# Patient Record
Sex: Male | Born: 1985
Health system: Southern US, Community
[De-identification: ages and names within clinical notes are randomized; demographics above are authoritative.]

## PROBLEM LIST (undated history)

## (undated) VITALS — BP 123/87 | HR 78 | Temp 97.7°F | Resp 16 | Ht 71.25 in | Wt 166.8 lb

## (undated) DIAGNOSIS — E119 Type 2 diabetes mellitus without complications: Secondary | ICD-10-CM

## (undated) DIAGNOSIS — F32A Depression, unspecified: Secondary | ICD-10-CM

## (undated) DIAGNOSIS — F329 Major depressive disorder, single episode, unspecified: Secondary | ICD-10-CM

---

## 2015-09-20 ENCOUNTER — Encounter (HOSPITAL_COMMUNITY): Payer: Self-pay | Admitting: *Deleted

## 2015-09-20 ENCOUNTER — Emergency Department (HOSPITAL_COMMUNITY)
Admission: EM | Admit: 2015-09-20 | Discharge: 2015-09-21 | Disposition: A | Discharge status: Other Acute Inpatient Hospital | Payer: Self-pay | Attending: Emergency Medicine | Admitting: Emergency Medicine

## 2015-09-20 DIAGNOSIS — Z720 Tobacco use: Secondary | ICD-10-CM | POA: Insufficient documentation

## 2015-09-20 DIAGNOSIS — E1065 Type 1 diabetes mellitus with hyperglycemia: Secondary | ICD-10-CM | POA: Insufficient documentation

## 2015-09-20 DIAGNOSIS — F329 Major depressive disorder, single episode, unspecified: Secondary | ICD-10-CM | POA: Insufficient documentation

## 2015-09-20 DIAGNOSIS — F1210 Cannabis abuse, uncomplicated: Secondary | ICD-10-CM | POA: Insufficient documentation

## 2015-09-20 DIAGNOSIS — Z794 Long term (current) use of insulin: Secondary | ICD-10-CM | POA: Insufficient documentation

## 2015-09-20 DIAGNOSIS — R45851 Suicidal ideations: Secondary | ICD-10-CM

## 2015-09-20 DIAGNOSIS — L42 Pityriasis rosea: Secondary | ICD-10-CM | POA: Insufficient documentation

## 2015-09-20 DIAGNOSIS — F1020 Alcohol dependence, uncomplicated: Secondary | ICD-10-CM

## 2015-09-20 HISTORY — DX: Major depressive disorder, single episode, unspecified: F32.9

## 2015-09-20 HISTORY — DX: Depression, unspecified: F32.A

## 2015-09-20 HISTORY — DX: Type 2 diabetes mellitus without complications: E11.9

## 2015-09-20 LAB — CBG MONITORING, ED
GLUCOSE-CAPILLARY: 370 mg/dL — AB (ref 65–99)
GLUCOSE-CAPILLARY: 406 mg/dL — AB (ref 65–99)
GLUCOSE-CAPILLARY: 319 mg/dL — AB (ref 65–99)
GLUCOSE-CAPILLARY: 273 mg/dL — AB (ref 65–99)
GLUCOSE-CAPILLARY: 381 mg/dL — AB (ref 65–99)
Glucose-Capillary: 214 mg/dL — ABNORMAL HIGH (ref 65–99)

## 2015-09-20 LAB — CBC
HCT: 48.1 % (ref 39.0–52.0)
Hemoglobin: 16.7 g/dL (ref 13.0–17.0)
MCH: 31.7 pg (ref 26.0–34.0)
MCHC: 34.7 g/dL (ref 30.0–36.0)
MCV: 91.3 fL (ref 78.0–100.0)
PLATELETS: 222 10*3/uL (ref 150–400)
RBC: 5.27 MIL/uL (ref 4.22–5.81)
RDW: 13.4 % (ref 11.5–15.5)
WBC: 9 10*3/uL (ref 4.0–10.5)

## 2015-09-20 LAB — ETHANOL

## 2015-09-20 LAB — COMPREHENSIVE METABOLIC PANEL
ALT: 22 U/L (ref 17–63)
ANION GAP: 9 (ref 5–15)
AST: 23 U/L (ref 15–41)
Albumin: 4.6 g/dL (ref 3.5–5.0)
Alkaline Phosphatase: 68 U/L (ref 38–126)
BILIRUBIN TOTAL: 1.5 mg/dL — AB (ref 0.3–1.2)
BUN: 15 mg/dL (ref 6–20)
CO2: 24 mmol/L (ref 22–32)
Calcium: 8.9 mg/dL (ref 8.9–10.3)
Chloride: 102 mmol/L (ref 101–111)
Creatinine, Ser: 1.04 mg/dL (ref 0.61–1.24)
Glucose, Bld: 401 mg/dL — ABNORMAL HIGH (ref 65–99)
POTASSIUM: 4.7 mmol/L (ref 3.5–5.1)
Sodium: 135 mmol/L (ref 135–145)
TOTAL PROTEIN: 7.6 g/dL (ref 6.5–8.1)

## 2015-09-20 LAB — ACETAMINOPHEN LEVEL: Acetaminophen (Tylenol), Serum: <10 ug/mL — ABNORMAL LOW (ref 10–30)

## 2015-09-20 LAB — RAPID URINE DRUG SCREEN, HOSP PERFORMED
Amphetamines: NOT DETECTED
BENZODIAZEPINES: NOT DETECTED
Barbiturates: NOT DETECTED
Cocaine: NOT DETECTED
Opiates: NOT DETECTED
Tetrahydrocannabinol: POSITIVE — AB

## 2015-09-20 LAB — SALICYLATE LEVEL

## 2015-09-20 MED ORDER — INSULIN ASPART 100 UNIT/ML ~~LOC~~ SOLN
10.0000 [IU] | Freq: Once | SUBCUTANEOUS | Status: AC
  Administered 2015-09-20: 10 [IU] via SUBCUTANEOUS
  Filled 2015-09-20: qty 1

## 2015-09-20 MED ORDER — SODIUM CHLORIDE 0.9 % IV BOLUS (SEPSIS)
1000.0000 mL | Freq: Once | INTRAVENOUS | Status: AC
  Administered 2015-09-20: 1000 mL via INTRAVENOUS

## 2015-09-20 MED ORDER — SODIUM CHLORIDE 0.9 % IV SOLN
Freq: Once | INTRAVENOUS | Status: AC
  Administered 2015-09-20: 19:00:00 via INTRAVENOUS

## 2015-09-20 MED ORDER — INSULIN ASPART 100 UNIT/ML ~~LOC~~ SOLN
12.0000 [IU] | Freq: Once | SUBCUTANEOUS | Status: AC
  Administered 2015-09-20: 12 [IU] via SUBCUTANEOUS

## 2015-09-20 MED ORDER — TRIAMCINOLONE ACETONIDE 0.5 % EX CREA
TOPICAL_CREAM | Freq: Two times a day (BID) | CUTANEOUS | Status: DC
  Administered 2015-09-20: 18:00:00 via TOPICAL
  Administered 2015-09-21: 1 via TOPICAL
  Filled 2015-09-20: qty 15

## 2015-09-20 MED ORDER — INSULIN NPH (HUMAN) (ISOPHANE) 100 UNIT/ML ~~LOC~~ SUSP
20.0000 [IU] | Freq: Two times a day (BID) | SUBCUTANEOUS | Status: DC
  Filled 2015-09-20: qty 10

## 2015-09-20 MED ORDER — INSULIN NPH (HUMAN) (ISOPHANE) 100 UNIT/ML ~~LOC~~ SUSP
20.0000 [IU] | Freq: Once | SUBCUTANEOUS | Status: AC
  Administered 2015-09-20: 20 [IU] via SUBCUTANEOUS

## 2015-09-20 MED ORDER — INSULIN NPH (HUMAN) (ISOPHANE) 100 UNIT/ML ~~LOC~~ SUSP
20.0000 [IU] | Freq: Two times a day (BID) | SUBCUTANEOUS | Status: DC
  Administered 2015-09-20: 20 [IU] via SUBCUTANEOUS
  Filled 2015-09-20 (×2): qty 10

## 2015-09-20 MED ORDER — ONDANSETRON 4 MG PO TBDP
4.0000 mg | ORAL_TABLET | Freq: Once | ORAL | Status: AC
  Administered 2015-09-20: 4 mg via ORAL
  Filled 2015-09-20: qty 1
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
## 2015-09-20 NOTE — ED Notes (Signed)
Pt states "I was really drunk and apparently texted people that I was going to kill myself.  I was going to take as much xanax as I could.  I get it from a street pharmacist.  I was married, we're just separated.  The sheriff came out but then I guess someone else called and the sheriff came back out and brought me in."  Pt denies SI presently.  States "I have no doctor for my diabetes, I buy the insulin OTC".

## 2015-09-20 NOTE — ED Provider Notes (Signed)
CSN: [id number]     Arrival date & time 09/20/15  1228 History  By signing my name below, I, Micheal Lopez, attest that this documentation has been prepared under the direction and in the presence of [employer], PA-C. Electronically Signed: Ronney Lopez, ED Scribe. 09/20/2015. 1:52 PM.    Chief Complaint  Patient presents with  . Suicidal   The history is provided by the patient. No language interpreter was used.    HPI Comments: Micheal Lopez is a 29 y.o. male with a history of Type 1 DM and depression, who presents to the Emergency Department complaining of intermittent SI that began a couple days ago. Patient states he called the suicide crisis hotline and they had sent the police over to bring him here to the ED. Last night, he had also gotten drunk from beer and liquor and texted people that he was going to kill himself. He states he did "not really" have a plan, although he also states he thought he would overdose on pills so that he would never wake up. He denies HI or hallucinations. Patient smokes marijuana but denies any other illicit drug use.   Patient notes chronic intermittent pain in his left lower back "whenever [he drinks] anything besides beer, water, or juice," as well as a skin rash on his front right abdomen and back that has been present all summer. It was alleviated after reducing his sun exposure.   Patient also complains of nausea. He denies any fever, chills, chest pain, abdominal pain, vomiting, diarrhea.   Past Medical History  Diagnosis Date  . Diabetes mellitus without complication   . Depression     per pt.   History reviewed. No pertinent past surgical history. No family history on file. Social History  Substance Use Topics  . Smoking status: Current Every Day Smoker  . Smokeless tobacco: None  . Alcohol Use: 50.4 oz/week    84 Cans of beer per week     Comment: "or more daily"    Review of Systems  Gastrointestinal: Positive for nausea.  Skin:  Positive for rash.  Psychiatric/Behavioral: Positive for suicidal ideas.  All other systems reviewed and are negative.   Allergies  Review of patient's allergies indicates no known allergies.  Home Medications   Prior to Admission medications   Medication Sig Start Date End Date Taking? Authorizing Provider  insulin NPH Human (NOVOLIN N) 100 UNIT/ML injection Inject 20 Units into the skin 2 (two) times daily.   Yes Historical Provider, MD  insulin regular (NOVOLIN R,HUMULIN R) 100 units/mL injection Inject 1-20 Units into the skin See admin instructions. Check cbgs 8 times a day. CBG >180= 5 units, 200-249= 10 units, 250-299= 12 units, 300s= 15 units, 400s= 20 units   Yes Historical Provider, MD   BP 140/78 mmHg  Pulse 106  Temp(Src) 98.2 F (36.8 C) (Oral)  Resp 20  Ht  (1.803 m)  Wt 180 lb (81.647 kg)  BMI 25.12 kg/m2  SpO2 97% Physical Exam  Constitutional: He is oriented to person, place, and time. He appears well-developed and well-nourished. No distress.  HENT:  Head: Normocephalic and atraumatic.  Eyes: Conjunctivae and EOM are normal.  Neck: Neck supple. No tracheal deviation present.  Cardiovascular: Normal rate.   Pulmonary/Chest: Effort normal. No respiratory distress.  Musculoskeletal: Normal range of motion.  Neurological: He is alert and oriented to person, place, and time.  Skin: Skin is warm and dry. Rash noted. There is erythema.  Macular,  non-raised, erythematous circular rash over the back and right side of the chest. It has no central clearing. No active drainage. Does not appear cellulitic in nature.   Psychiatric: He has a normal mood and affect. His speech is normal and behavior is normal. His affect is not inappropriate. He expresses suicidal ideation. He expresses no homicidal ideation. He expresses suicidal plans. He expresses no homicidal plans.  SI with a plan to take pills and not wake up. Not homicidal and not hallucinating. He is cooperative  and answers questions appropriately.   Nursing note and vitals reviewed.   ED Course  Procedures (including critical care time)  DIAGNOSTIC STUDIES: Oxygen Saturation is 97% on RA, normal by my interpretation.    COORDINATION OF CARE: 1:39 PM - Discussed treatment plan with pt at bedside which includes treatment for rash. Pt verbalized understanding and agreed to plan.    Labs Review Labs Reviewed  COMPREHENSIVE METABOLIC PANEL - Abnormal; Notable for the following:    Glucose, Bld 401 (*)    Total Bilirubin 1.5 (*)    All other components within normal limits  ACETAMINOPHEN LEVEL - Abnormal; Notable for the following:    Acetaminophen (Tylenol), Serum <10 (*)    All other components within normal limits  URINE RAPID DRUG SCREEN, HOSP PERFORMED - Abnormal; Notable for the following:    Tetrahydrocannabinol POSITIVE (*)    All other components within normal limits  CBG MONITORING, ED - Abnormal; Notable for the following:    Glucose-Capillary 370 (*)    All other components within normal limits  CBG MONITORING, ED - Abnormal; Notable for the following:    Glucose-Capillary 406 (*)    All other components within normal limits  ETHANOL  SALICYLATE LEVEL  CBC    Imaging Review No results found. I have personally reviewed and evaluated these lab results as part of my medical decision-making.   EKG Interpretation None      MDM   Final diagnoses:  Suicidal ideation  Pityriasis rosea  Type 1 diabetes mellitus with hyperglycemia  Patient presents for SI and rash. His vitals are stable and he is well-appearing. His glucose is 370 but otherwise his labs are not concerning. He states he did not take his insulin this morning. He also states that he takes 20 units of NovoLog in the morning and at night. He takes insulin throughout the day but states that it depends on his sugar level and does not use a sliding scale. He is not in DKA and has a normal anion gap. He was given 10  units of NovoLog and fluids. His rash appears to be pityriasis rosea and is most likely viral. This rash usually resolves on its own but he was given triamcinolone 0.5% cream. Filed Vitals:   09/20/15 1627  BP: 140/78  Pulse: 106  Temp:   Resp: 20  Holding orders were placed and home meds were ordered. Patient is stable for transfer to TCU. I personally performed the services described in this documentation, which was scribed in my presence. The recorded information has been reviewed and is accurate.     Catha Gosselin, PA-C 09/20/15 1641  Pricilla Loveless, MD 09/22/15 [phone]

## 2015-09-20 NOTE — Progress Notes (Signed)
CM spoke with pt who confirms uninsured [place] resident with no pcp.  CM discussed and provided written information for uninsured accepting pcps, discussed the importance of pcp vs EDP services for f/u care, www.needymeds.org, www.goodrx.com, discounted pharmacies and other [place] resources such as [facility] , [facility], affordable care act, financial assistance, uninsured dental services, [facility] med assist, DSS and  health department  Reviewed resources for [place] uninsured accepting pcps like [facility], [facility] [address], [facility], [facility], local urgent care centers, [facility], [facility], general medical clinics, [facility], [facility] plus others, medication resources, [facility] out patient pharmacies and housing Pt voiced understanding and appreciation of resources provided   Provided [facility] contact information Pt agreed to a referral Cm completed referral Pt to be contact by [facility] clinical liason  

## 2015-09-20 NOTE — ED Notes (Signed)
Sitter @ BS, Triage 3.
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
## 2015-09-21 ENCOUNTER — Encounter (HOSPITAL_COMMUNITY): Payer: Self-pay | Admitting: *Deleted

## 2015-09-21 ENCOUNTER — Inpatient Hospital Stay (HOSPITAL_COMMUNITY)
Admission: AD | Admit: 2015-09-21 | Discharge: 2015-09-23 | DRG: 897 | Disposition: A | Discharge status: Home / Self Care | Payer: Federal, State, Local not specified - Other | Source: Intra-hospital | Attending: Psychiatry | Admitting: Psychiatry

## 2015-09-21 DIAGNOSIS — F1024 Alcohol dependence with alcohol-induced mood disorder: Principal | ICD-10-CM | POA: Diagnosis present

## 2015-09-21 DIAGNOSIS — F1020 Alcohol dependence, uncomplicated: Secondary | ICD-10-CM

## 2015-09-21 DIAGNOSIS — E119 Type 2 diabetes mellitus without complications: Secondary | ICD-10-CM | POA: Diagnosis present

## 2015-09-21 DIAGNOSIS — F1994 Other psychoactive substance use, unspecified with psychoactive substance-induced mood disorder: Secondary | ICD-10-CM | POA: Diagnosis present

## 2015-09-21 DIAGNOSIS — R45851 Suicidal ideations: Secondary | ICD-10-CM | POA: Insufficient documentation

## 2015-09-21 DIAGNOSIS — F17210 Nicotine dependence, cigarettes, uncomplicated: Secondary | ICD-10-CM | POA: Diagnosis present

## 2015-09-21 LAB — CBG MONITORING, ED
GLUCOSE-CAPILLARY: 125 mg/dL — AB (ref 65–99)
GLUCOSE-CAPILLARY: 146 mg/dL — AB (ref 65–99)
GLUCOSE-CAPILLARY: 215 mg/dL — AB (ref 65–99)
GLUCOSE-CAPILLARY: 332 mg/dL — AB (ref 65–99)
GLUCOSE-CAPILLARY: 55 mg/dL — AB (ref 65–99)

## 2015-09-21 LAB — GLUCOSE, CAPILLARY
GLUCOSE-CAPILLARY: 215 mg/dL — AB (ref 65–99)
Glucose-Capillary: 350 mg/dL — ABNORMAL HIGH (ref 65–99)

## 2015-09-21 MED ORDER — INSULIN ASPART 100 UNIT/ML ~~LOC~~ SOLN: 4.0000 [IU] | Freq: Three times a day (TID) | SUBCUTANEOUS | Status: DC

## 2015-09-21 MED ORDER — INSULIN ASPART 100 UNIT/ML ~~LOC~~ SOLN: 0.0000 [IU] | Freq: Every day | SUBCUTANEOUS | Status: DC

## 2015-09-21 MED ORDER — INSULIN ASPART 100 UNIT/ML ~~LOC~~ SOLN
0.0000 [IU] | Freq: Three times a day (TID) | SUBCUTANEOUS | Status: DC
  Administered 2015-09-21: 3 [IU] via SUBCUTANEOUS
  Administered 2015-09-22: 5 [IU] via SUBCUTANEOUS
  Administered 2015-09-22: 2 [IU] via SUBCUTANEOUS
  Administered 2015-09-22: 9 [IU] via SUBCUTANEOUS

## 2015-09-21 MED ORDER — INSULIN ASPART 100 UNIT/ML ~~LOC~~ SOLN: 0.0000 [IU] | Freq: Three times a day (TID) | SUBCUTANEOUS | Status: DC

## 2015-09-21 MED ORDER — PNEUMOCOCCAL VAC POLYVALENT 25 MCG/0.5ML IJ INJ
0.5000 mL | INJECTION | INTRAMUSCULAR | Status: AC
  Administered 2015-09-22: 0.5 mL via INTRAMUSCULAR

## 2015-09-21 MED ORDER — TRAZODONE HCL 100 MG PO TABS: 100.0000 mg | ORAL_TABLET | Freq: Every day | ORAL | Status: DC

## 2015-09-21 MED ORDER — FLUOXETINE HCL 10 MG PO CAPS
10.0000 mg | ORAL_CAPSULE | Freq: Every day | ORAL | Status: DC
Start: 2015-09-21 — End: 2015-09-21
  Administered 2015-09-21: 10 mg via ORAL
  Filled 2015-09-21: qty 1

## 2015-09-21 MED ORDER — QUETIAPINE FUMARATE ER 50 MG PO TB24
50.0000 mg | ORAL_TABLET | Freq: Once | ORAL | Status: DC
  Filled 2015-09-21: qty 1

## 2015-09-21 MED ORDER — INSULIN ASPART 100 UNIT/ML ~~LOC~~ SOLN
4.0000 [IU] | Freq: Three times a day (TID) | SUBCUTANEOUS | Status: DC
  Administered 2015-09-21 – 2015-09-23 (×5): 4 [IU] via SUBCUTANEOUS

## 2015-09-21 MED ORDER — LORAZEPAM 1 MG PO TABS: 2.0000 mg | ORAL_TABLET | Freq: Four times a day (QID) | ORAL | Status: DC | PRN

## 2015-09-21 MED ORDER — INSULIN ASPART 100 UNIT/ML ~~LOC~~ SOLN
0.0000 [IU] | Freq: Three times a day (TID) | SUBCUTANEOUS | Status: DC
  Administered 2015-09-21: 7 [IU] via SUBCUTANEOUS

## 2015-09-21 MED ORDER — ALUM & MAG HYDROXIDE-SIMETH 200-200-20 MG/5ML PO SUSP
30.0000 mL | ORAL | Status: DC | PRN
  Administered 2015-09-21: 30 mL via ORAL
  Filled 2015-09-21: qty 30

## 2015-09-21 MED ORDER — INSULIN NPH (HUMAN) (ISOPHANE) 100 UNIT/ML ~~LOC~~ SUSP
20.0000 [IU] | Freq: Two times a day (BID) | SUBCUTANEOUS | Status: DC
  Administered 2015-09-21 – 2015-09-23 (×4): 20 [IU] via SUBCUTANEOUS

## 2015-09-21 MED ORDER — FLUOXETINE HCL 10 MG PO CAPS
10.0000 mg | ORAL_CAPSULE | Freq: Every day | ORAL | Status: DC
  Administered 2015-09-22 – 2015-09-23 (×2): 10 mg via ORAL
  Filled 2015-09-21 (×3): qty 1
  Filled 2015-09-21: qty 7

## 2015-09-21 MED ORDER — INSULIN NPH (HUMAN) (ISOPHANE) 100 UNIT/ML ~~LOC~~ SUSP
10.0000 [IU] | Freq: Once | SUBCUTANEOUS | Status: AC
  Administered 2015-09-21: 10 [IU] via SUBCUTANEOUS

## 2015-09-21 MED ORDER — INSULIN ASPART 100 UNIT/ML ~~LOC~~ SOLN
4.0000 [IU] | Freq: Three times a day (TID) | SUBCUTANEOUS | Status: DC
  Administered 2015-09-21: 4 [IU] via SUBCUTANEOUS

## 2015-09-21 MED ORDER — TRAZODONE HCL 100 MG PO TABS
100.0000 mg | ORAL_TABLET | Freq: Every day | ORAL | Status: DC
  Administered 2015-09-21 – 2015-09-22 (×2): 100 mg via ORAL
  Filled 2015-09-21: qty 1
  Filled 2015-09-21: qty 7
  Filled 2015-09-21 (×3): qty 1

## 2015-09-21 MED ORDER — TRIAMCINOLONE ACETONIDE 0.5 % EX CREA
TOPICAL_CREAM | Freq: Two times a day (BID) | CUTANEOUS | Status: DC
  Administered 2015-09-21 – 2015-09-23 (×4): via TOPICAL
  Filled 2015-09-21 (×2): qty 15

## 2015-09-21 MED ORDER — INSULIN ASPART 100 UNIT/ML ~~LOC~~ SOLN
0.0000 [IU] | Freq: Every day | SUBCUTANEOUS | Status: DC
  Administered 2015-09-21 – 2015-09-22 (×2): 4 [IU] via SUBCUTANEOUS

## 2015-09-21 NOTE — ED Notes (Signed)
DELAY IN INSULIN ADMINISTRATION. MADE AWARE OF AM CBG. FAITH MARIE CHARGE RN TO MADE EDP MADE. WILL THERE BE DOSAGE CHANGE.

## 2015-09-21 NOTE — Progress Notes (Signed)
Pt attended NA group this evening.  

## 2015-09-21 NOTE — ED Notes (Signed)
PT DISCHARGED TO [facility] AND TRANSFERRED BY [facility]
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
## 2015-09-21 NOTE — BH Assessment (Signed)
BHH Assessment Progress Note  Per Thedore Mins, MD, this pt requires psychiatric hospitalization at this time.  Berneice Heinrich, RN, [facility] has assigned pt to [facility] Rm 306-1.  Pt has signed Voluntary Admission and Consent for Treatment, as well as Consent to Release Information to his mother and his attorney, and signed forms have been faxed to [facility].  Pt's nurse, Morrie Sheldon, has been notified, and agrees to send original paperwork along with pt via Juel Burrow, and to call report to [phone].  Doylene Canning, MA Triage Specialist [phone]

## 2015-09-21 NOTE — ED Notes (Signed)
Charge RN Donnita Falls made aware Pt will not be going to [facility] for [facility].

## 2015-09-21 NOTE — Progress Notes (Signed)
D) Pt. Is 29 year old male admitted to [facility] for SI.  Patient reports that Monday, he drank 15 12oz beers and a 40oz.  Patient reports that while intoxicated, he texted the suicide hotline and several friends and told them "let's see if I will live through the night".  [employer] showed up to patient's home for a wellness check.  On the second wellness check, it was suggested that patient go to the hospital.  Stressor for patient includes that he is in the middle of a divorce, lives with his mom, and his 85 yo son lives with patient's ex wife.  Patient has hx of prior suicide attempt in February, when he found out his wife was cheating on him and decided to leave him.  Patient is Type 1 diabetic and takes insulin.  Patient reports marijuana use daily and at least 6 beers daily.  Patient currently denies SI/HI/AVH and pain.        A) Pt. oriented, skin assessment, and search completed. Consents signed.  R) Pt. receptive and cooperative with admission. Placed on q 15 min observations and contracts for safety despite passive SI.

## 2015-09-21 NOTE — ED Notes (Signed)
MD at bedside.TTS PRESENT 

## 2015-09-21 NOTE — ED Notes (Signed)
Pt. Has had breakfast and a additional [place] sandwich, cheese stick. And orange juice to drink.

## 2015-09-21 NOTE — Progress Notes (Addendum)
Inpatient Diabetes Program Recommendations  AACE/ADA: New Consensus Statement on Inpatient Glycemic Control (2015)  Target Ranges:  Prepandial:   less than 140 mg/dL      Peak postprandial:   less than 180 mg/dL (1-2 hours)      Critically ill patients:  140 - 180 mg/dL   Results for Micheal Lopez, Micheal Lopez (MRN [id number]) as of 09/21/2015 10:40  Ref. Range 09/20/2015 13:35 09/20/2015 16:22 09/20/2015 17:10 09/20/2015 18:06 09/20/2015 18:52 09/20/2015 20:00  Glucose-Capillary Latest Ref Range: 65-99 mg/dL 782 (H) 956 (H) 213 (H) 214 (H) 273 (H) 381 (H)    Results for Micheal Lopez, Micheal Lopez (MRN [id number]) as of 09/21/2015 10:40  Ref. Range 09/20/2015 23:57 09/21/2015 08:41 09/21/2015 09:02  Glucose-Capillary Latest Ref Range: 65-99 mg/dL 629 (H) 55 (L) 528 (H)     Admit with: Suicidal Thoughts  History: Type 1 DM  Home DM Meds: NPH insulin- 20 units bid       Regular insulin per SSI (CBG >180= 5 units, 200-249= 10 units, 250-299= 12 units, 300s= 15 units, 400s= 20 units- Per patient report)  Current DM Orders: NPH insulin- 20 units bid (breakfast and bedtime)     -Note patient received several large doses of Novolog yesterday afternoon (10 units at 4pm and another 12 units at 6pm).  -Patient also received 2 doses of NPH insulin very close together yesterday (20 units 4pm and another 20 units at 10pm).  -These back to back doses of NPH insulin likely caused patient's Hypoglycemia this AM (NPH insulin should be given at least 8-10 hours apart).  -Note AM dose of NPH insulin held this AM b/c patient had Hypoglycemia.  I am concerned that patient will have significant glucose elevations this afternoon if no basal insulin is given to patient this AM.    MD- Please consider the following insulin adjustments:  1. Give patient 10 units NPH insulin now X 1 dose  2. Continue NPH insulin 20 units bid (breakfast and bedtime).  Start NPH 20 units bid tonight at bedtime.  3. Start Novolog Sensitive SSI (0-9  units) TID AC + HS  4. Start Novolog Meal Coverage- Novolog 4 units tid with meals (hold if patient refuses to eat or eats poorly)    ----Will follow patient during hospitalization----  Ambrose Finland RN, MSN, CDE Diabetes Coordinator Inpatient Glycemic Control Team Team Pager: [phone] (8a-5p)   Addendum 1115: Called Dr. Criss Alvine in ED to discuss pt's CBGs and insulin orders.  Got orders to give pt 10 units NPH X 1 dose now and to start Novolog Sensitive SSI TID AC + HS along with Novolog 4 units tidwc as meal coverage. Orders placed and reviewed with RN caring for patient.

## 2015-09-21 NOTE — ED Notes (Addendum)
cbg 215 

## 2015-09-21 NOTE — ED Notes (Signed)
JEANNINE DIABETIC COORDINATOR CALLED AND WILL HAVE CARE PLAN FOR THIS PT. WILL DISCUSS THIS PLAN WITH EDP GOLDTON.

## 2015-09-21 NOTE — BHH Counselor (Signed)
Spike with Berneice Heinrich, RN, [facility] about patients vitals. RN states that patient will need Diabetes Education and is not appropriate for Observation. Informed patients nurse. Informed Dr. Criss Alvine of request for Diabetes Education Order and he states that he will enter it.   Davina Poke, LCSW Therapeutic Triage Specialist [facility] 09/21/2015 9:50 AM

## 2015-09-21 NOTE — ED Notes (Signed)
LUNCH TRAYS JUST ARRIVED

## 2015-09-21 NOTE — ED Notes (Signed)
TOM PRESENT SPEAKING WITH PT

## 2015-09-21 NOTE — Tx Team (Signed)
Initial Interdisciplinary Treatment Plan   PATIENT STRESSORS: Financial difficulties Legal issue Marital or family conflict Substance abuse   PATIENT STRENGTHS: Average or above average intelligence Communication skills General fund of knowledge Motivation for treatment/growth   PROBLEM LIST: Problem List/Patient Goals Date to be addressed Date deferred Reason deferred Estimated date of resolution  "text suicide hotline while intoxicated" 09/21/2015  09/21/2015     "way behind on bills" 09/21/2015  09/21/2015     "in the middle of divorce" 09/21/2015  09/21/2015     "I use marijuana daily" 09/21/2015  09/21/2015      "I'm a alcoholic" 09/21/2015  09/21/2015                              DISCHARGE CRITERIA:  Ability to meet basic life and health needs Adequate post-discharge living arrangements Improved stabilization in mood, thinking, and/or behavior Motivation to continue treatment in a less acute level of care Need for constant or close observation no longer present Reduction of life-threatening or endangering symptoms to within safe limits  PRELIMINARY DISCHARGE PLAN: Outpatient therapy Return to previous living arrangement Return to previous work or school arrangements  PATIENT/FAMIILY INVOLVEMENT: This treatment plan has been presented to and reviewed with the patient, Micheal Lopez.  The patient and family have been given the opportunity to ask questions and make suggestions.  Micheal Lopez P 09/21/2015, 7:14 PM

## 2015-09-22 ENCOUNTER — Encounter (HOSPITAL_COMMUNITY): Payer: Self-pay | Admitting: Psychiatry

## 2015-09-22 DIAGNOSIS — F1020 Alcohol dependence, uncomplicated: Secondary | ICD-10-CM

## 2015-09-22 DIAGNOSIS — F1994 Other psychoactive substance use, unspecified with psychoactive substance-induced mood disorder: Secondary | ICD-10-CM | POA: Diagnosis present

## 2015-09-22 LAB — GLUCOSE, CAPILLARY
GLUCOSE-CAPILLARY: 187 mg/dL — AB (ref 65–99)
GLUCOSE-CAPILLARY: 269 mg/dL — AB (ref 65–99)
GLUCOSE-CAPILLARY: 309 mg/dL — AB (ref 65–99)
Glucose-Capillary: 296 mg/dL — ABNORMAL HIGH (ref 65–99)

## 2015-09-22 LAB — TSH: TSH: 0.629 u[IU]/mL (ref 0.350–4.500)

## 2015-09-22 MED ORDER — LORAZEPAM 1 MG PO TABS
1.0000 mg | ORAL_TABLET | Freq: Three times a day (TID) | ORAL | Status: DC
Start: 2015-09-23 — End: 2015-09-23
  Administered 2015-09-23 (×2): 1 mg via ORAL
  Filled 2015-09-22 (×2): qty 1

## 2015-09-22 MED ORDER — ONDANSETRON 4 MG PO TBDP
4.0000 mg | ORAL_TABLET | Freq: Four times a day (QID) | ORAL | Status: DC | PRN
Start: 2015-09-22 — End: 2015-09-23

## 2015-09-22 MED ORDER — ADULT MULTIVITAMIN W/MINERALS CH
1.0000 | ORAL_TABLET | Freq: Every day | ORAL | Status: DC
  Administered 2015-09-22 – 2015-09-23 (×2): 1 via ORAL
  Filled 2015-09-22 (×5): qty 1

## 2015-09-22 MED ORDER — LORAZEPAM 1 MG PO TABS
1.0000 mg | ORAL_TABLET | Freq: Four times a day (QID) | ORAL | Status: AC
  Administered 2015-09-22 (×4): 1 mg via ORAL
  Filled 2015-09-22 (×4): qty 1

## 2015-09-22 MED ORDER — LORAZEPAM 1 MG PO TABS: 1.0000 mg | ORAL_TABLET | Freq: Two times a day (BID) | ORAL | Status: DC

## 2015-09-22 MED ORDER — THIAMINE HCL 100 MG/ML IJ SOLN
100.0000 mg | Freq: Once | INTRAMUSCULAR | Status: AC
  Administered 2015-09-22: 100 mg via INTRAMUSCULAR
  Filled 2015-09-22: qty 2

## 2015-09-22 MED ORDER — LOPERAMIDE HCL 2 MG PO CAPS: 2.0000 mg | ORAL_CAPSULE | ORAL | Status: DC | PRN

## 2015-09-22 MED ORDER — VITAMIN B-1 100 MG PO TABS
100.0000 mg | ORAL_TABLET | Freq: Every day | ORAL | Status: DC
  Administered 2015-09-23: 100 mg via ORAL
  Filled 2015-09-22 (×4): qty 1

## 2015-09-22 MED ORDER — LORAZEPAM 1 MG PO TABS: 1.0000 mg | ORAL_TABLET | Freq: Four times a day (QID) | ORAL | Status: DC | PRN

## 2015-09-22 MED ORDER — LORAZEPAM 1 MG PO TABS: 1.0000 mg | ORAL_TABLET | Freq: Every day | ORAL | Status: DC

## 2015-09-22 MED ORDER — HYDROXYZINE HCL 25 MG PO TABS: 25.0000 mg | ORAL_TABLET | Freq: Four times a day (QID) | ORAL | Status: DC | PRN
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
## 2015-09-22 NOTE — BHH Suicide Risk Assessment (Signed)
[facility] Admission Suicide Risk Assessment   Nursing information obtained from:  Patient Demographic factors:  Male, Adolescent or young adult, Divorced or widowed, Caucasian, Low socioeconomic status Current Mental Status:  Self-harm thoughts, Self-harm behaviors Loss Factors:  Loss of significant relationship (Going through a divorce) Historical Factors:  Prior suicide attempts, Family history of mental illness or substance abuse, Impulsivity Risk Reduction Factors:  Responsible for children under 56 years of age, Employed, Living with another person, especially a relative Total Time spent with patient: 45 minutes Principal Problem: Substance induced mood disorder Diagnosis:   Patient Active Problem List   Diagnosis Date Noted  . Substance induced mood disorder [F19.94] 09/22/2015  . Alcohol use disorder, severe, dependence [F10.20] 09/21/2015  . Suicidal ideation [R45.851]      Continued Clinical Symptoms:  Alcohol Use Disorder Identification Test Final Score (AUDIT): 16 The "Alcohol Use Disorders Identification Test", Guidelines for Use in Primary Care, Second Edition.  World Science writer [facility]). Score between 0-7:  no or low risk or alcohol related problems. Score between 8-15:  moderate risk of alcohol related problems. Score between 16-19:  high risk of alcohol related problems. Score 20 or above:  warrants further diagnostic evaluation for alcohol dependence and treatment.   CLINICAL FACTORS:   Depression:   Comorbid alcohol abuse/dependence Alcohol/Substance Abuse/Dependencies   Musculoskeletal: Strength & Muscle Tone: within normal limits Gait & Station: normal Patient leans: normal  Psychiatric Specialty Exam: Physical Exam  ROS  Blood pressure 134/73, pulse 96, temperature 97.6 F (36.4 C), temperature source Oral, resp. rate 16, height  (1.803 m), weight 83.008 kg (183 lb), SpO2 100 %.Body mass index is 25.53 kg/(m^2).    COGNITIVE FEATURES THAT  CONTRIBUTE TO RISK:  Closed-mindedness, Polarized thinking and Thought constriction (tunnel vision)    SUICIDE RISK:   Moderate:  Frequent suicidal ideation with limited intensity, and duration, some specificity in terms of plans, no associated intent, good self-control, limited dysphoria/symptomatology, some risk factors present, and identifiable protective factors, including available and accessible social support.  PLAN OF CARE: see Admission H and PE  Medical Decision Making:  Review of Psycho-Social Stressors (1), Review or order clinical lab tests (1), Review of Medication Regimen & Side Effects (2) and Review of New Medication or Change in Dosage (2)  I certify that inpatient services furnished can reasonably be expected to improve the patient's condition.   LUGO,IRVING A 09/22/2015, 2:57 PM

## 2015-09-22 NOTE — Progress Notes (Signed)
D: Brenten rates his day as "pretty good". Depression 0/10 Anxiety 3/10/ His goal is to figure out how to deal with life in general. He denies SI/HI/AVH. Contracts for safety. He had a visit from his mom and a family friend today. He states they are very supportive.  A: Encouraged Donshay to attend all groups as they may offer valuable teachings in regards to coping skills he could use upon discharge.  R: Will continue to monitor patient for safety and medication effectiveness.
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
## 2015-09-22 NOTE — Progress Notes (Signed)
Pt attended karaoke group this evening.  

## 2015-09-22 NOTE — BHH Group Notes (Signed)
BHH LCSW Group Therapy  09/22/2015 12:10 PM  Type of Therapy:  Group Therapy  Participation Level: Appropriate   Participation Quality:  Attentive  Affect:  Appropriate  Cognitive:  Appropriate  Insight:  Improving  Engagement in Therapy:  Improving  Modes of Intervention:  Confrontation, Discussion, Education, Problem-solving, Rapport Building, Socialization and Support  Summary of Progress/Problems:  Finding Balance in Life. Today's group focused on defining balance in one's own words, identifying things that can knock one off balance, and exploring healthy ways to maintain balance in life. Group members were asked to provide an example of a time when they felt off balance, describe how they handled that situation,and process healthier ways to regain balance in the future. Group members were asked to share the most important tool for maintaining balance that they learned while at [facility] and how they plan to apply this method after discharge. Micheal Lopez was attentive and engaged during today's processing group. He shared that he is in need of an after care plan for o/p therapy. [name] stated that he is recently single after 5 yrs of being a husband and father. "I feel like my life role has changed and I feel like I'm having a hard time with that." He continues to show progress in the group setting with improving insight.   Smart, Heather LCSWA  09/22/2015, 12:10 PM

## 2015-09-22 NOTE — B[facility] Group Notes (Signed)
[facility] Group Notes:  (Nursing/MHT/Case Management/Adjunct)  Date:  09/22/2015  Time:  4:35 PM  Type of Therapy:  Nurse Education  Participation Level:  Minimal  Participation Quality:  Appropriate  Affect:  Appropriate  Cognitive:  Alert  Insight:  Appropriate  Engagement in Group:  Limited  Modes of Intervention:  Discussion  Summary of Progress/Problems: Pt did not contribute in the discussion.  Bethann Punches 09/22/2015, 4:35 PM

## 2015-09-22 NOTE — Progress Notes (Signed)
Pt has been visible in the milieu interacting with peers and staff. Pt presented with bright and pleasant mood. Pt has been cooperative with taking his medication. No complains reported and his goals is getting out of here. Pt reported that his depression was a 0, his hopelessness was a 2, and that his anxiety was a 4. Pt reported being negative SI/HI, no AH/VH noted. A: 15 min checks continued for patient safety. R: Pts safety maintained.
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
## 2015-09-22 NOTE — Progress Notes (Signed)
   D:  Pt was pleasant. Pt asked for "something to help him with acid reflux". Pt denied having any questions or other concerns at this time. Writer observed pt in the dayroom interacting with his peers.   A:  Support and encouragement was offered. 15 min checks continued for safety.  R: Pt remains safe.

## 2015-09-23 LAB — LIPID PANEL
CHOLESTEROL: 193 mg/dL (ref 0–200)
HDL: 85 mg/dL (ref >40)
LDL CALC: 73 mg/dL (ref 0–99)
TRIGLYCERIDES: 177 mg/dL — AB (ref <150)
Total CHOL/HDL Ratio: 2.3 RATIO
VLDL: 35 mg/dL (ref 0–40)

## 2015-09-23 LAB — GLUCOSE, CAPILLARY
Glucose-Capillary: 100 mg/dL — ABNORMAL HIGH (ref 65–99)
Glucose-Capillary: 350 mg/dL — ABNORMAL HIGH (ref 65–99)

## 2015-09-23 MED ORDER — FLUOXETINE HCL 10 MG PO CAPS: 10.0000 mg | ORAL_CAPSULE | Freq: Every day | ORAL | Status: DC

## 2015-09-23 MED ORDER — INSULIN ASPART 100 UNIT/ML ~~LOC~~ SOLN: 0.0000 [IU] | Freq: Three times a day (TID) | SUBCUTANEOUS | Status: DC

## 2015-09-23 MED ORDER — INSULIN NPH (HUMAN) (ISOPHANE) 100 UNIT/ML ~~LOC~~ SUSP: 20.0000 [IU] | Freq: Two times a day (BID) | SUBCUTANEOUS | Status: DC

## 2015-09-23 MED ORDER — TRIAMCINOLONE ACETONIDE 0.5 % EX CREA: TOPICAL_CREAM | Freq: Two times a day (BID) | CUTANEOUS | Status: DC

## 2015-09-23 MED ORDER — INSULIN ASPART 100 UNIT/ML ~~LOC~~ SOLN
0.0000 [IU] | Freq: Three times a day (TID) | SUBCUTANEOUS | Status: DC
  Administered 2015-09-23: 11 [IU] via SUBCUTANEOUS

## 2015-09-23 MED ORDER — TRAZODONE HCL 100 MG PO TABS: 100.0000 mg | ORAL_TABLET | Freq: Every day | ORAL | Status: DC

## 2015-09-23 MED ORDER — INSULIN ASPART 100 UNIT/ML ~~LOC~~ SOLN
6.0000 [IU] | Freq: Three times a day (TID) | SUBCUTANEOUS | Status: DC
  Administered 2015-09-23: 6 [IU] via SUBCUTANEOUS
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
## 2015-09-23 NOTE — Progress Notes (Signed)
Nutrition Education Note  Pt attended group focusing on general, healthful nutrition education.  RD emphasized the importance of eating regular meals and snacks throughout the day. Consuming sugar-free beverages and incorporating fruits and vegetables into diet when possible. Provided examples of healthy snacks. Patient encouraged to leave group with a goal to improve nutrition/healthy eating.   Diet Order:   Pt is also offered choice of unit snacks mid-morning and mid-afternoon.  Pt is eating as desired.   If additional nutrition issues arise, please consult RD.    Jessica Ostheim, RD, LDN Inpatient Clinical Dietitian Pager # [phone] After hours/weekend pager # [phone]     

## 2015-09-23 NOTE — Progress Notes (Signed)
Discharge note:  Patient received all personal belongings, prescriptions and medication samples.  Reviewed all discharge instructions and medications with patient and she indicated understanding.  She denies SI/HI/AVH.  Patient left ambulatory with his mother.

## 2015-09-23 NOTE — [facility] Suicide Risk Assessment (Signed)
[facility] INPATIENT:  Family/Significant Other Suicide Prevention Education  Suicide Prevention Education:  Education Completed; Darlene Bartelt (pt's mother) [phone] has been identified by the patient as the family member/significant other with whom the patient will be residing, and identified as the person(s) who will aid the patient in the event of a mental health crisis (suicidal ideations/suicide attempt).  With written consent from the patient, the family member/significant other has been provided the following suicide prevention education, prior to the and/or following the discharge of the patient.  The suicide prevention education provided includes the following:  Suicide risk factors  Suicide prevention and interventions  National Suicide Hotline telephone number  [facility] assessment telephone number  [facility] Emergency Assistance 911  [facility] and/or Residential Mobile Crisis Unit telephone number  Request made of family/significant other to:  Remove weapons (e.g., guns, rifles, knives), all items previously/currently identified as safety concern.    Remove drugs/medications (over-the-counter, prescriptions, illicit drugs), all items previously/currently identified as a safety concern.  The family member/significant other verbalizes understanding of the suicide prevention education information provided.  The family member/significant other agrees to remove the items of safety concern listed above.  Smart, Heather LCSWA  09/23/2015, 10:55 AM

## 2015-09-23 NOTE — [facility] Suicide Risk Assessment (Signed)
[facility] Discharge Suicide Risk Assessment   Demographic Factors:  Male and Caucasian  Total Time spent with patient: 30 minutes  Musculoskeletal: Strength & Muscle Tone: within normal limits Gait & Station: normal Patient leans: normal  Psychiatric Specialty Exam: Physical Exam  Review of Systems  Constitutional: Negative.   HENT: Negative.   Eyes: Negative.   Respiratory: Negative.   Cardiovascular: Negative.   Gastrointestinal: Negative.   Genitourinary: Negative.   Musculoskeletal: Negative.   Skin: Negative.   Neurological: Negative.   Endo/Heme/Allergies: Negative.   Psychiatric/Behavioral: Positive for substance abuse.    Blood pressure 123/75, pulse 83, temperature 97.6 F (36.4 C), temperature source Oral, resp. rate 16, height  (1.803 m), weight 83.008 kg (183 lb), SpO2 100 %.Body mass index is 25.53 kg/(m^2).  General Appearance: Fairly Groomed  Patent attorney::  Fair  Speech:  Clear and Coherent409  Volume:  Normal  Mood:  Euthymic  Affect:  Appropriate  Thought Process:  Coherent and Goal Directed  Orientation:  Full (Time, Place, and Person)  Thought Content:  plans as he moves on, relapse prevention plan  Suicidal Thoughts:  No  Homicidal Thoughts:  No  Memory:  Immediate;   Fair Recent;   Fair Remote;   Fair  Judgement:  Fair  Insight:  Present  Psychomotor Activity:  Normal  Concentration:  Fair  Recall:  [employer] of Knowledge:Fair  Language: Fair  Akathisia:  No  Handed:  Right  AIMS (if indicated):     Assets:  Engineer, maintenance Social Support Vocational/Educational  Sleep:  Number of Hours: 6.75  Cognition: WNL  ADL's:  Intact      Has this patient used any form of tobacco in the last 30 days? (Cigarettes, Smokeless Tobacco, Cigars, and/or Pipes) No  Mental Status Per Nursing Assessment::   On Admission:  Self-harm thoughts, Self-harm behaviors  Current Mental Status by Physician: In full contact with reality. There  are no active SI plans or intent. There are no active S/S of withdrawal. Willing and motivated to pursue outpatient treatment   Loss Factors: NA  Historical Factors: NA  Risk Reduction Factors:   Responsible for children under 55 years of age, Sense of responsibility to family, Employed, Living with another person, especially a relative and Positive social support  Continued Clinical Symptoms:  Alcohol/Substance Abuse/Dependencies  Cognitive Features That Contribute To Risk:  None    Suicide Risk:  Minimal: No identifiable suicidal ideation.  Patients presenting with no risk factors but with morbid ruminations; may be classified as minimal risk based on the severity of the depressive symptoms  Principal Problem: Substance induced mood disorder Discharge Diagnoses:  Patient Active Problem List   Diagnosis Date Noted  . Substance induced mood disorder [F19.94] 09/22/2015  . Alcohol use disorder, severe, dependence [F10.20] 09/21/2015  . Suicidal ideation [R45.851]     Follow-up Information    [facility] On 09/27/2015.   Why:  Arrive between 9am-12pm on this date for assessment (medication management; individual therapy/Substance Abuse Intensive Outpatient). Please bring photo ID.    Contact information:   [address] [place], [place] [address] Phone: [phone] Fax: [phone]      Follow up with [facility]-Primary Care Appointment  On 10/17/2015.   Why:  Appt on this date at 10:45AM with Concepcion Living NP.    Contact information:   [address] [place], [place] [address] Phone: [phone] Fax: Provider has  access to [facility]      Plan Of Care/Follow-up recommendations:  Activity:  as tolerated Diet:  regular Follow up as above Is patient on multiple antipsychotic therapies at discharge:  No   Has Patient had three or more failed trials of antipsychotic monotherapy by history:  No  Recommended Plan  for Multiple Antipsychotic Therapies: NA    LUGO,IRVING A 09/23/2015, 12:54 PM

## 2015-09-23 NOTE — Progress Notes (Signed)
  [facility] Adult Case Management Discharge Plan :  Will you be returning to the same living situation after discharge:  Yes,  returning home with mother At discharge, do you have transportation home?: Yes,  pt's mother will pick him up at 2:00PM Do you have the ability to pay for your medications: Yes,  mental health  Release of information consent forms completed and submitted to medical records by CSW.  Patient to Follow up at: Follow-up Information    [facility] with [facility] On 09/27/2015.   Why:  Arrive between 9am-12pm on this date for assessment (medication management; individual therapy/Substance Abuse Intensive Outpatient). Please bring photo ID.    Contact information:   [address] [place], [place] [address] Phone: [phone] Fax: [phone]      Follow up with [facility]-Primary Care Appointment  On 10/17/2015.   Why:  Appt on this date at 10:45AM with Concepcion Living NP.    Contact information:   [address] [place], [place] [address] Phone: [phone] Fax: Provider has access to [facility]      Patient denies SI/HI: Yes,  during group/self report.     Safety Planning and Suicide Prevention discussed: Yes,  SPE completed with pt's mother. [facility] pamphlet provided to pt and he was given Mobile Crisis information.      Has patient been referred to the Quitline?: Pt declined quitline.   Smart, Heather LCSWA  09/23/2015, 10:57 AM
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
## 2015-09-23 NOTE — Progress Notes (Signed)
Inpatient Diabetes Program Recommendations  AACE/ADA: New Consensus Statement on Inpatient Glycemic Control (2015)  Target Ranges:  Prepandial:   less than 140 mg/dL      Peak postprandial:   less than 180 mg/dL (1-2 hours)      Critically ill patients:  140 - 180 mg/dL    Results for Micheal Lopez, Micheal Lopez (MRN [id number]) as of 09/23/2015 09:17  Ref. Range 09/22/2015 06:10 09/22/2015 12:07 09/22/2015 17:23 09/22/2015 21:40  Glucose-Capillary Latest Ref Range: 65-99 mg/dL 409 (H) 811 (H) 914 (H) 309 (H)    Admit with: Suicidal Thoughts  History: Type 1 DM  Home DM Meds: NPH insulin- 20 units bid  Regular insulin per SSI (CBG >180= 5 units, 200-249= 10 units, 250-299= 12 units, 300s= 15 units, 400s= 20 units- Per patient report)  Current DM Orders: NPH insulin- 20 units bid (breakfast and bedtime)            Novolog Sensitive SSI (0-9 units) TID AC + HS            Novolog 4 units tidwc    -Patient having issues with elevated postprandial glucose levels.  -Likely needs higher dose of Novolog with meals to better cover the carbohydrates he is consuming.    MD- Please consider the following in-hospital insulin adjustments:  1. Increase Novolog Meal Coverage to 6 units tid with meals   2. Increase Novolog SSI to Moderate scale (0-15 units) TID AC + HS     ----Will follow patient during hospitalization----  Ambrose Finland RN, MSN, CDE Diabetes Coordinator Inpatient Glycemic Control Team Team Pager: [phone] (8a-5p)

## 2015-09-24 LAB — HEMOGLOBIN A1C
Hgb A1c MFr Bld: 8.5 % — ABNORMAL HIGH (ref 4.8–5.6)
MEAN PLASMA GLUCOSE: 197 mg/dL

## 2015-10-17 ENCOUNTER — Ambulatory Visit (INDEPENDENT_AMBULATORY_CARE_PROVIDER_SITE_OTHER): Payer: Self-pay | Admitting: Family Medicine

## 2015-10-17 ENCOUNTER — Encounter: Payer: Self-pay | Admitting: Family Medicine

## 2015-10-17 VITALS — BP 120/72 | HR 66 | Temp 98.3°F | Resp 18 | Ht 71.0 in | Wt 180.0 lb

## 2015-10-17 DIAGNOSIS — Z0000 Encounter for general adult medical examination without abnormal findings: Secondary | ICD-10-CM

## 2015-10-17 DIAGNOSIS — B360 Pityriasis versicolor: Secondary | ICD-10-CM

## 2015-10-17 DIAGNOSIS — E108 Type 1 diabetes mellitus with unspecified complications: Secondary | ICD-10-CM

## 2015-10-17 LAB — GLUCOSE, CAPILLARY: GLUCOSE-CAPILLARY: 130 mg/dL — AB (ref 65–99)

## 2015-10-17 MED ORDER — FLUCONAZOLE 150 MG PO TABS: 150.0000 mg | ORAL_TABLET | Freq: Once | ORAL | Status: DC

## 2015-10-17 MED ORDER — INSULIN ASPART 100 UNIT/ML ~~LOC~~ SOLN: 0.0000 [IU] | Freq: Three times a day (TID) | SUBCUTANEOUS | Status: DC

## 2015-10-17 MED ORDER — INSULIN NPH (HUMAN) (ISOPHANE) 100 UNIT/ML ~~LOC~~ SUSP: 20.0000 [IU] | Freq: Two times a day (BID) | SUBCUTANEOUS | Status: DC

## 2015-10-17 NOTE — Patient Instructions (Addendum)
Continue the Novolin N 20 units BID Continue sliding scale Novolog.Be sure to check BS before using sliding scale.  Take your hospital bill to [facility] services to arrange for financial assistance. Follow-up in three months or when you get financial assistance. Attempt to exercise regularly and eat a diet low in  Concentrated sweets and other carbs and high in fresh fruits, vegetables and lean protein.

## 2015-10-17 NOTE — Progress Notes (Signed)
Patient ID: Micheal Lopez, male   DOB: [birth year], 29 y.o.   MRN: [id number]   Micheal Lopez, is a 29 y.o. male  [id number]  [id number]  DOB - [birth year]  CC:  Chief Complaint  Patient presents with  . Establish Care       HPI: Micheal Lopez is a 29 y.o. male here to establish care. He was recently on 9/28 in the hospital for suicidal ideation, which he states have resolved.  He will be followed for his depression at [facility]. He has a history of alcohol abuse. He has a history of Type I Diabetes and has been self treating for some time with OTC insulins. Reports taking 20 units of NPH BID and using Novolog with meals, most often 15 units. He reports not testing his sugar regularly before taking the Novolog but just going on how he feels. His last A1c on 9/28 was 8.5  His BS today just before noon is 130.  He reports he does not exercise regularly and does not always follow at good diabetic diet. It has been greater than 10 years since his last tetanus booster. He has not had a flu shot. He is past due for a diabetic eye exam, a urine micral and a foot exam. He is currently using Vape to try to stop smoking. He denies alcohol use in 2 months. He does smoke cannabis. He has had recent bloodwork. 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  No Known Allergies Past Medical History  Diagnosis Date  . Diabetes mellitus without complication (HCC)   . Depression     per pt.   Current Outpatient Prescriptions on File Prior to Visit  Medication Sig Dispense Refill  . FLUoxetine (PROZAC) 10 MG capsule Take 1 capsule (10 mg total) by mouth daily. 30 capsule 0  . traZODone (DESYREL) 100 MG tablet Take 1 tablet (100 mg total) by mouth at bedtime. 30 tablet 0  . triamcinolone cream (KENALOG) 0.5 % Apply topically 2 (two) times daily. 15 g 0   No current facility-administered medications on file prior to visit.   Family  History  Problem Relation Age of Onset  . Cancer Maternal Grandmother     BREAST    Social History   Social History  . Marital Status: Legally Separated    Spouse Name: N/A  . Number of Children: N/A  . Years of Education: N/A   Occupational History  . Not on file.   Social History Main Topics  . Smoking status: Current Every Day Smoker  . Smokeless tobacco: Never Used  . Alcohol Use: 50.4 oz/week    84 Cans of beer per week     Comment: "or more daily"  . Drug Use: 7.00 per week    Special: Marijuana  . Sexual Activity: No   Other Topics Concern  . Not on file   Social History Narrative    Review of Systems: Constitutional: Negative for fever, chills, appetite change, weight loss,  Fatigue. Skin: Negative for rashes or lesions of concern. HENT: Negative for ear pain, ear discharge.nose bleeds Eyes: Negative for pain, discharge, redness, itching and visual disturbance. Neck: Negative for pain, stiffness Respiratory: Negative for cough, shortness of breath,   Cardiovascular: Negative for chest pain, palpitations and leg swelling. Gastrointestinal: Negative for abdominal pain, nausea, vomiting, diarrhea, constipations Genitourinary: Negative for dysuria, urgency, frequency, hematuria,  Musculoskeletal: Negative for back pain, joint  pain, joint  swelling, and gait problem.Negative for weakness. Neurological: Negative for dizziness, tremors, seizures, syncope,   light-headedness, numbness and headaches.  Hematological: Negative for easy bruising or bleeding Psychiatric/Behavioral: Negative for depression, anxiety, decreased concentration, confusion Skin:  Positive for widespread papular rash.   Objective:   Filed Vitals:   10/17/15 1124  BP: 120/72  Pulse: 66  Temp: 98.3 F (36.8 C)  Resp: 18    Physical Exam: Constitutional: Patient appears well-developed and well-nourished. No distress. HENT: Normocephalic, atraumatic, External right and left ear normal.  Oropharynx is clear and moist.  Eyes: Conjunctivae and EOM are normal. PERRLA, no scleral icterus. Neck: Normal ROM. Neck supple. No lymphadenopathy, No thyromegaly. CVS: RRR, S1/S2 +, no murmurs, no gallops, no rubs Pulmonary: Effort and breath sounds normal, no stridor, rhonchi, wheezes, rales.  Abdominal: Soft. Normoactive BS,, no distension, tenderness, rebound or guarding.  Musculoskeletal: Normal range of motion. No edema and no tenderness.  Neuro: Alert.Normal muscle tone coordination. Non-focal Skin: Skin is warm and dry.  Not diaphoretic. No erythema. No pallor.There is a wide -spread erythematous papular rash consistent with tinea versicolor. sychiatric: Normal mood and affect. Behavior, judgment, thought content normal. Foot exam: Within normal limits, monofilament normal.  Lab Results  Component Value Date   WBC 9.0 09/20/2015   HGB 16.7 09/20/2015   HCT 48.1 09/20/2015   MCV 91.3 09/20/2015   PLT 222 09/20/2015   Lab Results  Component Value Date   CREATININE 1.04 09/20/2015   BUN 15 09/20/2015   NA 135 09/20/2015   K 4.7 09/20/2015   CL 102 09/20/2015   CO2 24 09/20/2015    Lab Results  Component Value Date   HGBA1C 8.5* 09/23/2015   Lipid Panel     Component Value Date/Time   CHOL 193 09/23/2015 0651   TRIG 177* 09/23/2015 0651   HDL 85 09/23/2015 0651   CHOLHDL 2.3 09/23/2015 0651   VLDL 35 09/23/2015 0651   LDLCALC 73 09/23/2015 0651       Assessment and plan:   1. Type 1 diabetes mellitus with complication (HCC)  - Vitamin D 1,25 dihydroxy - Lipid panel - HIV antibody (with reflex) - Glucose (CBG) - Microalbumin, urine  2. Health care maintenance  - HIV antibody (with reflex)  3. Tinea Versicolor - Diflucan 150 mg. #1, one po x one.   No Follow-up on file.  The patient was given clear instructions to go to ER or return to medical center if symptoms don't improve, worsen or new problems develop. The patient verbalized understanding.     Henrietta HooverLinda C Bernhardt FNP  10/17/2015, 1:09 PM

## 2015-10-18 LAB — LIPID PANEL
CHOL/HDL RATIO: 2.1 ratio (ref <=5.0)
Cholesterol: 146 mg/dL (ref 125–200)
HDL: 68 mg/dL (ref >=40)
LDL CALC: 61 mg/dL (ref <130)
TRIGLYCERIDES: 87 mg/dL (ref <150)
VLDL: 17 mg/dL (ref <30)

## 2015-10-18 LAB — HIV ANTIBODY (ROUTINE TESTING W REFLEX): HIV 1&2 Ab, 4th Generation: NONREACTIVE

## 2015-10-18 LAB — MICROALBUMIN, URINE: MICROALB UR: 1.4 mg/dL

## 2015-10-20 LAB — VITAMIN D 1,25 DIHYDROXY
Vitamin D 1, 25 (OH)2 Total: 43 pg/mL (ref 18–72)
Vitamin D2 1, 25 (OH)2: <8 pg/mL
Vitamin D3 1, 25 (OH)2: 43 pg/mL

## 2015-11-16 ENCOUNTER — Other Ambulatory Visit: Payer: Self-pay

## 2015-12-15 ENCOUNTER — Ambulatory Visit: Payer: Self-pay | Admitting: Family Medicine

## 2016-01-30 ENCOUNTER — Ambulatory Visit: Payer: Self-pay | Attending: Podiatry

## 2016-06-20 ENCOUNTER — Emergency Department (HOSPITAL_COMMUNITY)
Admission: EM | Admit: 2016-06-20 | Discharge: 2016-06-20 | Disposition: A | Discharge status: Home / Self Care | Payer: Self-pay | Attending: Emergency Medicine | Admitting: Emergency Medicine

## 2016-06-20 ENCOUNTER — Emergency Department (HOSPITAL_COMMUNITY): Payer: Self-pay

## 2016-06-20 ENCOUNTER — Encounter (HOSPITAL_COMMUNITY): Payer: Self-pay | Admitting: Emergency Medicine

## 2016-06-20 DIAGNOSIS — F1290 Cannabis use, unspecified, uncomplicated: Secondary | ICD-10-CM | POA: Insufficient documentation

## 2016-06-20 DIAGNOSIS — Z794 Long term (current) use of insulin: Secondary | ICD-10-CM | POA: Insufficient documentation

## 2016-06-20 DIAGNOSIS — S0990XA Unspecified injury of head, initial encounter: Secondary | ICD-10-CM

## 2016-06-20 DIAGNOSIS — F17210 Nicotine dependence, cigarettes, uncomplicated: Secondary | ICD-10-CM | POA: Insufficient documentation

## 2016-06-20 DIAGNOSIS — S0181XA Laceration without foreign body of other part of head, initial encounter: Secondary | ICD-10-CM

## 2016-06-20 DIAGNOSIS — W228XXA Striking against or struck by other objects, initial encounter: Secondary | ICD-10-CM | POA: Insufficient documentation

## 2016-06-20 DIAGNOSIS — S0101XA Laceration without foreign body of scalp, initial encounter: Secondary | ICD-10-CM | POA: Insufficient documentation

## 2016-06-20 DIAGNOSIS — Y939 Activity, unspecified: Secondary | ICD-10-CM | POA: Insufficient documentation

## 2016-06-20 DIAGNOSIS — F329 Major depressive disorder, single episode, unspecified: Secondary | ICD-10-CM | POA: Insufficient documentation

## 2016-06-20 DIAGNOSIS — Y929 Unspecified place or not applicable: Secondary | ICD-10-CM | POA: Insufficient documentation

## 2016-06-20 DIAGNOSIS — S93402A Sprain of unspecified ligament of left ankle, initial encounter: Secondary | ICD-10-CM | POA: Insufficient documentation

## 2016-06-20 DIAGNOSIS — Y999 Unspecified external cause status: Secondary | ICD-10-CM | POA: Insufficient documentation

## 2016-06-20 DIAGNOSIS — E109 Type 1 diabetes mellitus without complications: Secondary | ICD-10-CM | POA: Insufficient documentation

## 2016-06-20 LAB — CBG MONITORING, ED
GLUCOSE-CAPILLARY: 40 mg/dL — AB (ref 65–99)
GLUCOSE-CAPILLARY: 83 mg/dL (ref 65–99)
GLUCOSE-CAPILLARY: 133 mg/dL — AB (ref 65–99)
Glucose-Capillary: 43 mg/dL — CL (ref 65–99)

## 2016-06-20 MED ORDER — HYDROCODONE-ACETAMINOPHEN 5-325 MG PO TABS: 1.0000 | ORAL_TABLET | ORAL | Status: DC | PRN

## 2016-06-20 MED ORDER — DEXTROSE 50 % IV SOLN
1.0000 | Freq: Once | INTRAVENOUS | Status: AC
  Administered 2016-06-20: 50 mL via INTRAVENOUS

## 2016-06-20 MED ORDER — DEXTROSE 50 % IV SOLN
INTRAVENOUS | Status: AC
  Administered 2016-06-20: 50 mL via INTRAVENOUS
  Filled 2016-06-20: qty 50

## 2016-06-20 MED ORDER — TETANUS-DIPHTH-ACELL PERTUSSIS 5-2.5-18.5 LF-MCG/0.5 IM SUSP
0.5000 mL | Freq: Once | INTRAMUSCULAR | Status: AC
  Administered 2016-06-20: 0.5 mL via INTRAMUSCULAR
  Filled 2016-06-20: qty 0.5

## 2016-06-20 MED ORDER — IBUPROFEN 800 MG PO TABS: 800.0000 mg | ORAL_TABLET | Freq: Three times a day (TID) | ORAL | Status: DC | PRN

## 2016-06-20 MED ORDER — LIDOCAINE HCL (PF) 1 % IJ SOLN
5.0000 mL | Freq: Once | INTRAMUSCULAR | Status: AC
  Administered 2016-06-20: 5 mL via INTRADERMAL
  Filled 2016-06-20: qty 30

## 2016-06-20 MED ORDER — ACETAMINOPHEN 500 MG PO TABS
1000.0000 mg | ORAL_TABLET | Freq: Once | ORAL | Status: AC
  Administered 2016-06-20: 1000 mg via ORAL
  Filled 2016-06-20: qty 2

## 2016-06-20 MED ORDER — BACITRACIN ZINC 500 UNIT/GM EX OINT
TOPICAL_OINTMENT | Freq: Once | CUTANEOUS | Status: AC
  Administered 2016-06-20: 1 via TOPICAL
  Filled 2016-06-20: qty 0.9
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
## 2016-06-20 NOTE — ED Notes (Signed)
Pt given ham sandwich and cheese.
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
## 2016-06-20 NOTE — ED Notes (Signed)
Pt is a type 1 diabetic and requested his sugar be checked

## 2016-06-20 NOTE — ED Notes (Signed)
Suture cart and bacitracin at bedside

## 2016-06-20 NOTE — Discharge Instructions (Signed)
Ankle Sprain °An ankle sprain is an injury to the strong, fibrous tissues (ligaments) that hold the bones of your ankle joint together.  °CAUSES °An ankle sprain is usually caused by a fall or by twisting your ankle. Ankle sprains most commonly occur when you step on the outer edge of your foot, and your ankle turns inward. People who participate in sports are more prone to these types of injuries.  °SYMPTOMS  °· Pain in your ankle. The pain may be present at rest or only when you are trying to stand or walk. °· Swelling. °· Bruising. Bruising may develop immediately or within 1 to 2 days after your injury. °· Difficulty standing or walking, particularly when turning corners or changing directions. °DIAGNOSIS  °Your caregiver will ask you details about your injury and perform a physical exam of your ankle to determine if you have an ankle sprain. During the physical exam, your caregiver will press on and apply pressure to specific areas of your foot and ankle. Your caregiver will try to move your ankle in certain ways. An X-ray exam may be done to be sure a bone was not broken or a ligament did not separate from one of the bones in your ankle (avulsion fracture).  °TREATMENT  °Certain types of braces can help stabilize your ankle. Your caregiver can make a recommendation for this. Your caregiver may recommend the use of medicine for pain. If your sprain is severe, your caregiver may refer you to a surgeon who helps to restore function to parts of your skeletal system (orthopedist) or a physical therapist. °HOME CARE INSTRUCTIONS  °· Apply ice to your injury for 1-2 days or as directed by your caregiver. Applying ice helps to reduce inflammation and pain. °¨ Put ice in a plastic bag. °¨ Place a towel between your skin and the bag. °¨ Leave the ice on for 15-20 minutes at a time, every 2 hours while you are awake. °· Only take over-the-counter or prescription medicines for pain, discomfort, or fever as directed by  your caregiver. °· Elevate your injured ankle above the level of your heart as much as possible for 2-3 days. °· If your caregiver recommends crutches, use them as instructed. Gradually put weight on the affected ankle. Continue to use crutches or a cane until you can walk without feeling pain in your ankle. °· If you have a plaster splint, wear the splint as directed by your caregiver. Do not rest it on anything harder than a pillow for the first 24 hours. Do not put weight on it. Do not get it wet. You may take it off to take a shower or bath. °· You may have been given an elastic bandage to wear around your ankle to provide support. If the elastic bandage is too tight (you have numbness or tingling in your foot or your foot becomes cold and blue), adjust the bandage to make it comfortable. °· If you have an air splint, you may blow more air into it or let air out to make it more comfortable. You may take your splint off at night and before taking a shower or bath. Wiggle your toes in the splint several times per day to decrease swelling. °SEEK MEDICAL CARE IF:  °· You have rapidly increasing bruising or swelling. °· Your toes feel extremely cold or you lose feeling in your foot. °· Your pain is not relieved with medicine. °SEEK IMMEDIATE MEDICAL CARE IF: °· Your toes are numb or blue. °·   You have severe pain that is increasing. MAKE SURE YOU:   Understand these instructions.  Will watch your condition.  Will get help right away if you are not doing well or get worse.   This information is not intended to replace advice given to you by your health care provider. Make sure you discuss any questions you have with your health care provider.   Document Released: 12/10/2005 Document Revised: 12/31/2014 Document Reviewed: 12/22/2011 Elsevier Interactive Patient Education 2016 [employer]  Facial Laceration  A facial laceration is a cut on the face. These injuries can be painful and cause bleeding.  Lacerations usually heal quickly, but they need special care to reduce scarring. DIAGNOSIS  Your health care provider will take a medical history, ask for details about how the injury occurred, and examine the wound to determine how deep the cut is. TREATMENT  Some facial lacerations may not require closure. Others may not be able to be closed because of an increased risk of infection. The risk of infection and the chance for successful closure will depend on various factors, including the amount of time since the injury occurred. The wound may be cleaned to help prevent infection. If closure is appropriate, pain medicines may be given if needed. Your health care provider will use stitches (sutures), wound glue (adhesive), or skin adhesive strips to repair the laceration. These tools bring the skin edges together to allow for faster healing and a better cosmetic outcome. If needed, you may also be given a tetanus shot. HOME CARE INSTRUCTIONS  Only take over-the-counter or prescription medicines as directed by your health care provider.  Follow your health care provider's instructions for wound care. These instructions will vary depending on the technique used for closing the wound. For Sutures:  Keep the wound clean and dry.   If you were given a bandage (dressing), you should change it at least once a day. Also change the dressing if it becomes wet or dirty, or as directed by your health care provider.   Wash the wound with soap and water 2 times a day. Rinse the wound off with water to remove all soap. Pat the wound dry with a clean towel.   After cleaning, apply a thin layer of the antibiotic ointment recommended by your health care provider. This will help prevent infection and keep the dressing from sticking.   You may shower as usual after the first 24 hours. Do not soak the wound in water until the sutures are removed.   Get your sutures removed as directed by your health care  provider. With facial lacerations, sutures should usually be taken out after 4-5 days to avoid stitch marks.   Wait a few days after your sutures are removed before applying any makeup. For Skin Adhesive Strips:  Keep the wound clean and dry.   Do not get the skin adhesive strips wet. You may bathe carefully, using caution to keep the wound dry.   If the wound gets wet, pat it dry with a clean towel.   Skin adhesive strips will fall off on their own. You may trim the strips as the wound heals. Do not remove skin adhesive strips that are still stuck to the wound. They will fall off in time.  For Wound Adhesive:  You may briefly wet your wound in the shower or bath. Do not soak or scrub the wound. Do not swim. Avoid periods of heavy sweating until the skin adhesive has fallen off on  its own. After showering or bathing, gently pat the wound dry with a clean towel.   Do not apply liquid medicine, cream medicine, ointment medicine, or makeup to your wound while the skin adhesive is in place. This may loosen the film before your wound is healed.   If a dressing is placed over the wound, be careful not to apply tape directly over the skin adhesive. This may cause the adhesive to be pulled off before the wound is healed.   Avoid prolonged exposure to sunlight or tanning lamps while the skin adhesive is in place.  The skin adhesive will usually remain in place for 5-10 days, then naturally fall off the skin. Do not pick at the adhesive film.  After Healing: Once the wound has healed, cover the wound with sunscreen during the day for 1 full year. This can help minimize scarring. Exposure to ultraviolet light in the first year will darken the scar. It can take 1-2 years for the scar to lose its redness and to heal completely.  SEEK MEDICAL CARE IF:  You have a fever. SEEK IMMEDIATE MEDICAL CARE IF:  You have redness, pain, or swelling around the wound.   You see ayellowish-white fluid  (pus) coming from the wound.    This information is not intended to replace advice given to you by your health care provider. Make sure you discuss any questions you have with your health care provider.   Document Released: 01/17/2005 Document Revised: 12/31/2014 Document Reviewed: 07/23/2013 Elsevier Interactive Patient Education 2016 [employer] Inc[employer]  Head Injury, Adult You have a head injury. Headaches and throwing up (vomiting) are common after a head injury. It should be easy to wake up from sleeping. Sometimes you must stay in the hospital. Most problems happen within the first 24 hours. Side effects may occur up to 7-10 days after the injury.  WHAT ARE THE TYPES OF HEAD INJURIES? Head injuries can be as minor as a bump. Some head injuries can be more severe. More severe head injuries include:  A jarring injury to the brain (concussion).  A bruise of the brain (contusion). This mean there is bleeding in the brain that can cause swelling.  A cracked skull (skull fracture).  Bleeding in the brain that collects, clots, and forms a bump (hematoma). WHEN SHOULD I GET HELP RIGHT AWAY?   You are confused or sleepy.  You cannot be woken up.  You feel sick to your stomach (nauseous) or keep throwing up (vomiting).  Your dizziness or unsteadiness is getting worse.  You have very bad, lasting headaches that are not helped by medicine. Take medicines only as told by your doctor.  You cannot use your arms or legs like normal.  You cannot walk.  You notice changes in the black spots in the center of the colored part of your eye (pupil).  You have clear or bloody fluid coming from your nose or ears.  You have trouble seeing. During the next 24 hours after the injury, you must stay with someone who can watch you. This person should get help right away (call 911 in the [place] if you start to shake and are not able to control it (have seizures), you pass out, or you are unable to wake  up. HOW CAN I PREVENT A HEAD INJURY IN THE FUTURE?  Wear seat belts.  Wear a helmet while bike riding and playing sports like football.  Stay away from dangerous activities around the house. WHEN CAN I  RETURN TO NORMAL ACTIVITIES AND ATHLETICS? See your doctor before doing these activities. You should not do normal activities or play contact sports until 1 week after the following symptoms have stopped:  Headache that does not go away.  Dizziness.  Poor attention.  Confusion.  Memory problems.  Sickness to your stomach or throwing up.  Tiredness.  Fussiness.  Bothered by bright lights or loud noises.  Anxiousness or depression.  Restless sleep. MAKE SURE YOU:   Understand these instructions.  Will watch your condition.  Will get help right away if you are not doing well or get worse.   This information is not intended to replace advice given to you by your health care provider. Make sure you discuss any questions you have with your health care provider.   Document Released: 11/22/2008 Document Revised: 12/31/2014 Document Reviewed: 08/17/2013 Elsevier Interactive Patient Education 2016 [employer]  Concussion, Adult A concussion, or closed-head injury, is a brain injury caused by a direct blow to the head or by a quick and sudden movement (jolt) of the head or neck. Concussions are usually not life-threatening. Even so, the effects of a concussion can be serious. If you have had a concussion before, you are more likely to experience concussion-like symptoms after a direct blow to the head.  CAUSES  Direct blow to the head, such as from running into another player during a soccer game, being hit in a fight, or hitting your head on a hard surface.  A jolt of the head or neck that causes the brain to move back and forth inside the skull, such as in a car crash. SIGNS AND SYMPTOMS The signs of a concussion can be hard to notice. Early on, they may be missed by you,  family members, and health care providers. You may look fine but act or feel differently. Symptoms are usually temporary, but they may last for days, weeks, or even longer. Some symptoms may appear right away while others may not show up for hours or days. Every head injury is different. Symptoms include:  Mild to moderate headaches that will not go away.  A feeling of pressure inside your head.  Having more trouble than usual:  Learning or remembering things you have heard.  Answering questions.  Paying attention or concentrating.  Organizing daily tasks.  Making decisions and solving problems.  Slowness in thinking, acting or reacting, speaking, or reading.  Getting lost or being easily confused.  Feeling tired all the time or lacking energy (fatigued).  Feeling drowsy.  Sleep disturbances.  Sleeping more than usual.  Sleeping less than usual.  Trouble falling asleep.  Trouble sleeping (insomnia).  Loss of balance or feeling lightheaded or dizzy.  Nausea or vomiting.  Numbness or tingling.  Increased sensitivity to:  Sounds.  Lights.  Distractions.  Vision problems or eyes that tire easily.  Diminished sense of taste or smell.  Ringing in the ears.  Mood changes such as feeling sad or anxious.  Becoming easily irritated or angry for little or no reason.  Lack of motivation.  Seeing or hearing things other people do not see or hear (hallucinations). DIAGNOSIS Your health care provider can usually diagnose a concussion based on a description of your injury and symptoms. He or she will ask whether you passed out (lost consciousness) and whether you are having trouble remembering events that happened right before and during your injury. Your evaluation might include:  A brain scan to look for signs of injury  to the brain. Even if the test shows no injury, you may still have a concussion.  Blood tests to be sure other problems are not  present. TREATMENT  Concussions are usually treated in an emergency department, in urgent care, or at a clinic. You may need to stay in the hospital overnight for further treatment.  Tell your health care provider if you are taking any medicines, including prescription medicines, over-the-counter medicines, and natural remedies. Some medicines, such as blood thinners (anticoagulants) and aspirin, may increase the chance of complications. Also tell your health care provider whether you have had alcohol or are taking illegal drugs. This information may affect treatment.  Your health care provider will send you home with important instructions to follow.  How fast you will recover from a concussion depends on many factors. These factors include how severe your concussion is, what part of your brain was injured, your age, and how healthy you were before the concussion.  Most people with mild injuries recover fully. Recovery can take time. In general, recovery is slower in older persons. Also, persons who have had a concussion in the past or have other medical problems may find that it takes longer to recover from their current injury. HOME CARE INSTRUCTIONS General Instructions  Carefully follow the directions your health care provider gave you.  Only take over-the-counter or prescription medicines for pain, discomfort, or fever as directed by your health care provider.  Take only those medicines that your health care provider has approved.  Do not drink alcohol until your health care provider says you are well enough to do so. Alcohol and certain other drugs may slow your recovery and can put you at risk of further injury.  If it is harder than usual to remember things, write them down.  If you are easily distracted, try to do one thing at a time. For example, do not try to watch TV while fixing dinner.  Talk with family members or close friends when making important decisions.  Keep all  follow-up appointments. Repeated evaluation of your symptoms is recommended for your recovery.  Watch your symptoms and tell others to do the same. Complications sometimes occur after a concussion. Older adults with a brain injury may have a higher risk of serious complications, such as a blood clot on the brain.  Tell your teachers, school nurse, school counselor, coach, athletic trainer, or work Production designer, theatre/television/film about your injury, symptoms, and restrictions. Tell them about what you can or cannot do. They should watch for:  Increased problems with attention or concentration.  Increased difficulty remembering or learning new information.  Increased time needed to complete tasks or assignments.  Increased irritability or decreased ability to cope with stress.  Increased symptoms.  Rest. Rest helps the brain to heal. Make sure you:  Get plenty of sleep at night. Avoid staying up late at night.  Keep the same bedtime hours on weekends and weekdays.  Rest during the day. Take daytime naps or rest breaks when you feel tired.  Limit activities that require a lot of thought or concentration. These include:  Doing homework or job-related work.  Watching TV.  Working on the computer.  Avoid any situation where there is potential for another head injury (football, hockey, soccer, basketball, martial arts, downhill snow sports and horseback riding). Your condition will get worse every time you experience a concussion. You should avoid these activities until you are evaluated by the appropriate follow-up health care providers. Returning To Your  Regular Activities You will need to return to your normal activities slowly, not all at once. You must give your body and brain enough time for recovery.  Do not return to sports or other athletic activities until your health care provider tells you it is safe to do so.  Ask your health care provider when you can drive, ride a bicycle, or operate heavy  machinery. Your ability to react may be slower after a brain injury. Never do these activities if you are dizzy.  Ask your health care provider about when you can return to work or school. Preventing Another Concussion It is very important to avoid another brain injury, especially before you have recovered. In rare cases, another injury can lead to permanent brain damage, brain swelling, or death. The risk of this is greatest during the first 7-10 days after a head injury. Avoid injuries by:  Wearing a seat belt when riding in a car.  Drinking alcohol only in moderation.  Wearing a helmet when biking, skiing, skateboarding, skating, or doing similar activities.  Avoiding activities that could lead to a second concussion, such as contact or recreational sports, until your health care provider says it is okay.  Taking safety measures in your home.  Remove clutter and tripping hazards from floors and stairways.  Use grab bars in bathrooms and handrails by stairs.  Place non-slip mats on floors and in bathtubs.  Improve lighting in dim areas. SEEK MEDICAL CARE IF:  You have increased problems paying attention or concentrating.  You have increased difficulty remembering or learning new information.  You need more time to complete tasks or assignments than before.  You have increased irritability or decreased ability to cope with stress.  You have more symptoms than before. Seek medical care if you have any of the following symptoms for more than 2 weeks after your injury:  Lasting (chronic) headaches.  Dizziness or balance problems.  Nausea.  Vision problems.  Increased sensitivity to noise or light.  Depression or mood swings.  Anxiety or irritability.  Memory problems.  Difficulty concentrating or paying attention.  Sleep problems.  Feeling tired all the time. SEEK IMMEDIATE MEDICAL CARE IF:  You have severe or worsening headaches. These may be a sign of a  blood clot in the brain.  You have weakness (even if only in one hand, leg, or part of the face).  You have numbness.  You have decreased coordination.  You vomit repeatedly.  You have increased sleepiness.  One pupil is larger than the other.  You have convulsions.  You have slurred speech.  You have increased confusion. This may be a sign of a blood clot in the brain.  You have increased restlessness, agitation, or irritability.  You are unable to recognize people or places.  You have neck pain.  It is difficult to wake you up.  You have unusual behavior changes.  You lose consciousness. MAKE SURE YOU:  Understand these instructions.  Will watch your condition.  Will get help right away if you are not doing well or get worse.   This information is not intended to replace advice given to you by your health care provider. Make sure you discuss any questions you have with your health care provider.   Document Released: 03/01/2004 Document Revised: 12/31/2014 Document Reviewed: 07/02/2013 Elsevier Interactive Patient Education [employer]

## 2016-06-20 NOTE — ED Notes (Signed)
Pt transported to CT ?

## 2016-06-20 NOTE — ED Notes (Signed)
Pt given orange juice.

## 2016-06-20 NOTE — ED Notes (Signed)
Pt transported to XR.  

## 2016-07-31 ENCOUNTER — Encounter (HOSPITAL_COMMUNITY): Payer: Self-pay | Admitting: *Deleted

## 2016-07-31 ENCOUNTER — Emergency Department (HOSPITAL_COMMUNITY)
Admission: EM | Admit: 2016-07-31 | Discharge: 2016-08-01 | Disposition: A | Discharge status: Other Acute Inpatient Hospital | Payer: Self-pay | Attending: Emergency Medicine | Admitting: Emergency Medicine

## 2016-07-31 DIAGNOSIS — E1165 Type 2 diabetes mellitus with hyperglycemia: Secondary | ICD-10-CM | POA: Insufficient documentation

## 2016-07-31 DIAGNOSIS — Z5321 Procedure and treatment not carried out due to patient leaving prior to being seen by health care provider: Secondary | ICD-10-CM | POA: Insufficient documentation

## 2016-07-31 DIAGNOSIS — F332 Major depressive disorder, recurrent severe without psychotic features: Secondary | ICD-10-CM | POA: Diagnosis present

## 2016-07-31 DIAGNOSIS — F10920 Alcohol use, unspecified with intoxication, uncomplicated: Secondary | ICD-10-CM

## 2016-07-31 DIAGNOSIS — Z794 Long term (current) use of insulin: Secondary | ICD-10-CM | POA: Insufficient documentation

## 2016-07-31 DIAGNOSIS — F10120 Alcohol abuse with intoxication, uncomplicated: Secondary | ICD-10-CM

## 2016-07-31 DIAGNOSIS — F17210 Nicotine dependence, cigarettes, uncomplicated: Secondary | ICD-10-CM | POA: Insufficient documentation

## 2016-07-31 DIAGNOSIS — R45851 Suicidal ideations: Secondary | ICD-10-CM

## 2016-07-31 DIAGNOSIS — F1020 Alcohol dependence, uncomplicated: Secondary | ICD-10-CM | POA: Diagnosis present

## 2016-07-31 DIAGNOSIS — F10929 Alcohol use, unspecified with intoxication, unspecified: Secondary | ICD-10-CM | POA: Insufficient documentation

## 2016-07-31 DIAGNOSIS — R739 Hyperglycemia, unspecified: Secondary | ICD-10-CM

## 2016-07-31 LAB — CBG MONITORING, ED
GLUCOSE-CAPILLARY: 101 mg/dL — AB (ref 65–99)
GLUCOSE-CAPILLARY: 47 mg/dL — AB (ref 65–99)
GLUCOSE-CAPILLARY: 78 mg/dL (ref 65–99)
GLUCOSE-CAPILLARY: 455 mg/dL — AB (ref 65–99)
GLUCOSE-CAPILLARY: 219 mg/dL — AB (ref 65–99)
GLUCOSE-CAPILLARY: 225 mg/dL — AB (ref 65–99)
Glucose-Capillary: 376 mg/dL — ABNORMAL HIGH (ref 65–99)
Glucose-Capillary: 526 mg/dL (ref 65–99)
Glucose-Capillary: 322 mg/dL — ABNORMAL HIGH (ref 65–99)
Glucose-Capillary: 90 mg/dL (ref 65–99)
Glucose-Capillary: 144 mg/dL — ABNORMAL HIGH (ref 65–99)

## 2016-07-31 LAB — COMPREHENSIVE METABOLIC PANEL
ALBUMIN: 4.9 g/dL (ref 3.5–5.0)
ALK PHOS: 65 U/L (ref 38–126)
ALT: 18 U/L (ref 17–63)
AST: 20 U/L (ref 15–41)
Anion gap: 14 (ref 5–15)
BUN: 16 mg/dL (ref 6–20)
CALCIUM: 9.1 mg/dL (ref 8.9–10.3)
CHLORIDE: 99 mmol/L — AB (ref 101–111)
CO2: 19 mmol/L — AB (ref 22–32)
CREATININE: 0.95 mg/dL (ref 0.61–1.24)
GFR calc non Af Amer: >60 mL/min (ref >60)
GLUCOSE: 473 mg/dL — AB (ref 65–99)
Potassium: 3.8 mmol/L (ref 3.5–5.1)
SODIUM: 132 mmol/L — AB (ref 135–145)
Total Bilirubin: 0.8 mg/dL (ref 0.3–1.2)
Total Protein: 7.8 g/dL (ref 6.5–8.1)

## 2016-07-31 LAB — RAPID URINE DRUG SCREEN, HOSP PERFORMED
AMPHETAMINES: NOT DETECTED
BARBITURATES: NOT DETECTED
Benzodiazepines: NOT DETECTED
COCAINE: NOT DETECTED
OPIATES: NOT DETECTED
TETRAHYDROCANNABINOL: NOT DETECTED

## 2016-07-31 LAB — ETHANOL: Alcohol, Ethyl (B): 119 mg/dL — ABNORMAL HIGH (ref <5)

## 2016-07-31 LAB — CBC
HEMATOCRIT: 43.8 % (ref 39.0–52.0)
HEMOGLOBIN: 15.1 g/dL (ref 13.0–17.0)
MCH: 30.8 pg (ref 26.0–34.0)
MCHC: 34.5 g/dL (ref 30.0–36.0)
MCV: 89.4 fL (ref 78.0–100.0)
Platelets: 218 10*3/uL (ref 150–400)
RBC: 4.9 MIL/uL (ref 4.22–5.81)
RDW: 12.9 % (ref 11.5–15.5)
WBC: 14.2 10*3/uL — ABNORMAL HIGH (ref 4.0–10.5)

## 2016-07-31 LAB — ACETAMINOPHEN LEVEL

## 2016-07-31 LAB — SALICYLATE LEVEL

## 2016-07-31 MED ORDER — LORAZEPAM 1 MG PO TABS: 0.0000 mg | ORAL_TABLET | Freq: Two times a day (BID) | ORAL | Status: DC

## 2016-07-31 MED ORDER — INSULIN ASPART 100 UNIT/ML ~~LOC~~ SOLN: 0.0000 [IU] | Freq: Three times a day (TID) | SUBCUTANEOUS | Status: DC

## 2016-07-31 MED ORDER — DOXEPIN HCL 25 MG PO CAPS
100.0000 mg | ORAL_CAPSULE | Freq: Every evening | ORAL | Status: DC | PRN
  Filled 2016-07-31: qty 4

## 2016-07-31 MED ORDER — FOLIC ACID 1 MG PO TABS
1.0000 mg | ORAL_TABLET | Freq: Every day | ORAL | Status: DC
  Administered 2016-07-31 – 2016-08-01 (×2): 1 mg via ORAL
  Filled 2016-07-31 (×2): qty 1

## 2016-07-31 MED ORDER — INSULIN ASPART 100 UNIT/ML ~~LOC~~ SOLN
0.0000 [IU] | Freq: Three times a day (TID) | SUBCUTANEOUS | Status: DC
  Administered 2016-07-31: 15 [IU] via SUBCUTANEOUS
  Administered 2016-07-31: 5 [IU] via SUBCUTANEOUS
  Filled 2016-07-31 (×2): qty 1

## 2016-07-31 MED ORDER — INSULIN ASPART 100 UNIT/ML ~~LOC~~ SOLN
15.0000 [IU] | Freq: Once | SUBCUTANEOUS | Status: AC
  Administered 2016-07-31: 15 [IU] via SUBCUTANEOUS

## 2016-07-31 MED ORDER — LORAZEPAM 2 MG/ML IJ SOLN: 1.0000 mg | Freq: Four times a day (QID) | INTRAMUSCULAR | Status: DC | PRN

## 2016-07-31 MED ORDER — ACETAMINOPHEN 325 MG PO TABS: 650.0000 mg | ORAL_TABLET | ORAL | Status: DC | PRN

## 2016-07-31 MED ORDER — VITAMIN B-1 100 MG PO TABS
100.0000 mg | ORAL_TABLET | Freq: Every day | ORAL | Status: DC
  Administered 2016-07-31 – 2016-08-01 (×2): 100 mg via ORAL
  Filled 2016-07-31 (×2): qty 1

## 2016-07-31 MED ORDER — FLUOXETINE HCL 20 MG PO CAPS
20.0000 mg | ORAL_CAPSULE | Freq: Every day | ORAL | Status: DC
  Administered 2016-07-31 – 2016-08-01 (×2): 20 mg via ORAL
  Filled 2016-07-31 (×2): qty 1

## 2016-07-31 MED ORDER — NICOTINE 21 MG/24HR TD PT24: 21.0000 mg | MEDICATED_PATCH | Freq: Every day | TRANSDERMAL | Status: DC

## 2016-07-31 MED ORDER — INSULIN ASPART 100 UNIT/ML ~~LOC~~ SOLN
10.0000 [IU] | Freq: Once | SUBCUTANEOUS | Status: AC
  Administered 2016-07-31: 10 [IU] via SUBCUTANEOUS
  Filled 2016-07-31: qty 1

## 2016-07-31 MED ORDER — THIAMINE HCL 100 MG/ML IJ SOLN
100.0000 mg | Freq: Every day | INTRAMUSCULAR | Status: DC
Start: 2016-07-31 — End: 2016-08-01

## 2016-07-31 MED ORDER — LORAZEPAM 1 MG PO TABS
1.0000 mg | ORAL_TABLET | Freq: Four times a day (QID) | ORAL | Status: DC | PRN
  Administered 2016-07-31: 1 mg via ORAL
  Filled 2016-07-31: qty 1

## 2016-07-31 MED ORDER — ONDANSETRON HCL 4 MG PO TABS: 4.0000 mg | ORAL_TABLET | Freq: Three times a day (TID) | ORAL | Status: DC | PRN

## 2016-07-31 MED ORDER — LORAZEPAM 1 MG PO TABS: 1.0000 mg | ORAL_TABLET | Freq: Three times a day (TID) | ORAL | Status: DC | PRN

## 2016-07-31 MED ORDER — IBUPROFEN 200 MG PO TABS: 600.0000 mg | ORAL_TABLET | Freq: Three times a day (TID) | ORAL | Status: DC | PRN

## 2016-07-31 MED ORDER — ALUM & MAG HYDROXIDE-SIMETH 200-200-20 MG/5ML PO SUSP: 30.0000 mL | ORAL | Status: DC | PRN

## 2016-07-31 MED ORDER — ZOLPIDEM TARTRATE 5 MG PO TABS
5.0000 mg | ORAL_TABLET | Freq: Every evening | ORAL | Status: DC | PRN
  Administered 2016-07-31: 5 mg via ORAL
  Filled 2016-07-31: qty 1

## 2016-07-31 MED ORDER — LORAZEPAM 1 MG PO TABS: 0.0000 mg | ORAL_TABLET | Freq: Four times a day (QID) | ORAL | Status: DC

## 2016-07-31 MED ORDER — INSULIN ASPART 100 UNIT/ML ~~LOC~~ SOLN
4.0000 [IU] | Freq: Three times a day (TID) | SUBCUTANEOUS | Status: DC
  Administered 2016-07-31 – 2016-08-01 (×3): 4 [IU] via SUBCUTANEOUS
  Filled 2016-07-31 (×3): qty 1

## 2016-07-31 MED ORDER — INSULIN GLARGINE 100 UNIT/ML ~~LOC~~ SOLN
35.0000 [IU] | Freq: Every day | SUBCUTANEOUS | Status: DC
  Administered 2016-07-31: 35 [IU] via SUBCUTANEOUS
  Filled 2016-07-31: qty 0.35

## 2016-07-31 MED ORDER — ADULT MULTIVITAMIN W/MINERALS CH
1.0000 | ORAL_TABLET | Freq: Every day | ORAL | Status: DC
  Administered 2016-07-31 – 2016-08-01 (×2): 1 via ORAL
  Filled 2016-07-31 (×2): qty 1

## 2016-07-31 NOTE — ED Notes (Signed)
EDP notified of CBG 526 mg/dl. Novolog 15 units sq X 1 Dose ordered. Will continue to monitor.

## 2016-07-31 NOTE — ED Notes (Signed)
Pt compliant with morning medication regimen. Sad affect. Pt denies SI/HI. Denies AVH. Special checks q 15 mins in place for safety. Video monitoring in place.

## 2016-07-31 NOTE — ED Notes (Signed)
Pt ambulatory off unit to room #24 with MHT. Report given to accepting nurse.

## 2016-07-31 NOTE — ED Triage Notes (Signed)
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

## 2016-07-31 NOTE — ED Notes (Addendum)
EDP notified of CBG 455 MG/DL. Pt asymptomatic. Instructed to give insulin as ordered, no new orders. Will continue to monitor.

## 2016-07-31 NOTE — ED Notes (Signed)
[facility] [employer] [employer], AC notified of pt CBG.

## 2016-07-31 NOTE — ED Notes (Signed)
Pt eating lunch
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
## 2016-07-31 NOTE — ED Provider Notes (Signed)
Notified by nursing of unstable sugars. Patient taken to room in acute ED. On my assessment, patient is very well-appearing and in no acute distress. He has a long history of brittle diabetes with large fluctuations in blood sugar. He denies any recent insulin changes. Denies any nausea, vomiting, lightheadedness, dizziness, diaphoresis or symptoms of hyperglycemia. Initial lab work reviewed shows no signs of DKA or other acute complication. Patient was given food here and monitored with stable blood sugars 3 hours. Patient remains medically stable for psychiatric disposition.   Shaune Pollackameron Elnora Quizon, MD 07/31/16 [phone]
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
## 2016-07-31 NOTE — ED Notes (Signed)
Pt back on unit, behavior calm and cooperative, voicing no complaints at this time. Special checks q 15 mins in place for safety.Video monitoring in place.

## 2016-07-31 NOTE — ED Notes (Signed)
Pt resting at present, no distress noted, respirations even and unlabored.  Monitoring for safety, Q 15 min checks in effect.

## 2016-07-31 NOTE — ED Notes (Addendum)
0805: CBG 47 . Pt alert. Asymptomatic. Hypoglycemic protocol initiated. Orange juice given.  0820: CBG recheck 78. Pt has no complaints at this time. Breakfast tray provided. Will continue to monitor.

## 2016-07-31 NOTE — ED Notes (Signed)
Bed: [id number] Expected date:  Expected time:  Means of arrival:  Comments: Psych ED pt

## 2016-07-31 NOTE — ED Notes (Signed)
Pt presents with SI, plan to hang self. Denies HI or AVH.  Feeling hopeless,  Diagnosed with Depression and Generalized Anxiety Disorder.  Pt reports he drinks Alcohol, 12-18 beers/day.  Admits to Marijuana use.  A&O x 3, no distress noted, calm & cooperative.  Monitoring for safety, Q 15 min checks in effect.

## 2016-08-01 ENCOUNTER — Inpatient Hospital Stay (HOSPITAL_COMMUNITY)
Admission: AD | Admit: 2016-08-01 | Discharge: 2016-08-09 | DRG: 885 | Disposition: A | Discharge status: Home / Self Care | Payer: Federal, State, Local not specified - Other | Source: Intra-hospital | Attending: Psychiatry | Admitting: Psychiatry

## 2016-08-01 ENCOUNTER — Encounter (HOSPITAL_COMMUNITY): Payer: Self-pay | Admitting: *Deleted

## 2016-08-01 DIAGNOSIS — F10129 Alcohol abuse with intoxication, unspecified: Secondary | ICD-10-CM | POA: Diagnosis present

## 2016-08-01 DIAGNOSIS — R45851 Suicidal ideations: Secondary | ICD-10-CM | POA: Diagnosis present

## 2016-08-01 DIAGNOSIS — F332 Major depressive disorder, recurrent severe without psychotic features: Principal | ICD-10-CM | POA: Diagnosis present

## 2016-08-01 DIAGNOSIS — Z794 Long term (current) use of insulin: Secondary | ICD-10-CM | POA: Diagnosis not present

## 2016-08-01 DIAGNOSIS — F17290 Nicotine dependence, other tobacco product, uncomplicated: Secondary | ICD-10-CM | POA: Diagnosis present

## 2016-08-01 DIAGNOSIS — E109 Type 1 diabetes mellitus without complications: Secondary | ICD-10-CM | POA: Diagnosis present

## 2016-08-01 DIAGNOSIS — F1290 Cannabis use, unspecified, uncomplicated: Secondary | ICD-10-CM | POA: Diagnosis present

## 2016-08-01 DIAGNOSIS — Y905 Blood alcohol level of 100-119 mg/100 ml: Secondary | ICD-10-CM | POA: Diagnosis present

## 2016-08-01 DIAGNOSIS — Z803 Family history of malignant neoplasm of breast: Secondary | ICD-10-CM | POA: Diagnosis not present

## 2016-08-01 LAB — GLUCOSE, CAPILLARY
Glucose-Capillary: 166 mg/dL — ABNORMAL HIGH (ref 65–99)
Glucose-Capillary: 277 mg/dL — ABNORMAL HIGH (ref 65–99)
Glucose-Capillary: 369 mg/dL — ABNORMAL HIGH (ref 65–99)

## 2016-08-01 LAB — CBG MONITORING, ED: GLUCOSE-CAPILLARY: 114 mg/dL — AB (ref 65–99)

## 2016-08-01 MED ORDER — ADULT MULTIVITAMIN W/MINERALS CH: 1.0000 | ORAL_TABLET | Freq: Every day | ORAL | Status: DC

## 2016-08-01 MED ORDER — THIAMINE HCL 100 MG/ML IJ SOLN: 100.0000 mg | Freq: Once | INTRAMUSCULAR | Status: DC

## 2016-08-01 MED ORDER — NICOTINE POLACRILEX 2 MG MT GUM
2.0000 mg | CHEWING_GUM | OROMUCOSAL | Status: DC | PRN
  Administered 2016-08-01 – 2016-08-08 (×24): 2 mg via ORAL
  Filled 2016-08-01 (×18): qty 1

## 2016-08-01 MED ORDER — INSULIN ASPART 100 UNIT/ML ~~LOC~~ SOLN
4.0000 [IU] | Freq: Three times a day (TID) | SUBCUTANEOUS | Status: DC
  Administered 2016-08-01 – 2016-08-06 (×16): 4 [IU] via SUBCUTANEOUS

## 2016-08-01 MED ORDER — FOLIC ACID 1 MG PO TABS
1.0000 mg | ORAL_TABLET | Freq: Every day | ORAL | Status: DC
  Administered 2016-08-02 – 2016-08-09 (×8): 1 mg via ORAL
  Filled 2016-08-01 (×11): qty 1

## 2016-08-01 MED ORDER — LORAZEPAM 1 MG PO TABS
1.0000 mg | ORAL_TABLET | Freq: Four times a day (QID) | ORAL | Status: AC
  Administered 2016-08-01 (×3): 1 mg via ORAL
  Filled 2016-08-01 (×3): qty 1

## 2016-08-01 MED ORDER — INSULIN GLARGINE 100 UNIT/ML ~~LOC~~ SOLN
35.0000 [IU] | Freq: Every day | SUBCUTANEOUS | Status: DC
  Administered 2016-08-01 – 2016-08-05 (×5): 35 [IU] via SUBCUTANEOUS

## 2016-08-01 MED ORDER — MAGNESIUM HYDROXIDE 400 MG/5ML PO SUSP: 30.0000 mL | Freq: Every day | ORAL | Status: DC | PRN

## 2016-08-01 MED ORDER — INSULIN ASPART 100 UNIT/ML ~~LOC~~ SOLN
0.0000 [IU] | Freq: Three times a day (TID) | SUBCUTANEOUS | Status: DC
  Administered 2016-08-01: 3 [IU] via SUBCUTANEOUS
  Administered 2016-08-01 – 2016-08-02 (×2): 8 [IU] via SUBCUTANEOUS
  Administered 2016-08-02: 3 [IU] via SUBCUTANEOUS
  Administered 2016-08-03: 15 [IU] via SUBCUTANEOUS
  Administered 2016-08-03 (×2): 5 [IU] via SUBCUTANEOUS
  Administered 2016-08-04 (×2): 8 [IU] via SUBCUTANEOUS
  Administered 2016-08-05: 15 [IU] via SUBCUTANEOUS
  Administered 2016-08-05: 11 [IU] via SUBCUTANEOUS
  Administered 2016-08-06: 8 [IU] via SUBCUTANEOUS
  Administered 2016-08-06: 15 [IU] via SUBCUTANEOUS
  Administered 2016-08-06: 2 [IU] via SUBCUTANEOUS
  Administered 2016-08-07: 8 [IU] via SUBCUTANEOUS
  Administered 2016-08-07: 11 [IU] via SUBCUTANEOUS
  Administered 2016-08-07: 8 [IU] via SUBCUTANEOUS
  Administered 2016-08-08: 15 [IU] via SUBCUTANEOUS
  Administered 2016-08-08 (×2): 5 [IU] via SUBCUTANEOUS
  Administered 2016-08-09: 8 [IU] via SUBCUTANEOUS
  Administered 2016-08-09: 6 [IU] via SUBCUTANEOUS

## 2016-08-01 MED ORDER — LOPERAMIDE HCL 2 MG PO CAPS: 2.0000 mg | ORAL_CAPSULE | ORAL | Status: AC | PRN

## 2016-08-01 MED ORDER — HYDROXYZINE HCL 25 MG PO TABS: 25.0000 mg | ORAL_TABLET | Freq: Four times a day (QID) | ORAL | Status: AC | PRN

## 2016-08-01 MED ORDER — THIAMINE HCL 100 MG/ML IJ SOLN: 100.0000 mg | Freq: Every day | INTRAMUSCULAR | Status: DC

## 2016-08-01 MED ORDER — IBUPROFEN 600 MG PO TABS: 600.0000 mg | ORAL_TABLET | Freq: Three times a day (TID) | ORAL | Status: DC | PRN

## 2016-08-01 MED ORDER — VITAMIN B-1 100 MG PO TABS: 100.0000 mg | ORAL_TABLET | Freq: Every day | ORAL | Status: DC

## 2016-08-01 MED ORDER — LORAZEPAM 1 MG PO TABS: 1.0000 mg | ORAL_TABLET | Freq: Four times a day (QID) | ORAL | Status: DC | PRN

## 2016-08-01 MED ORDER — LORAZEPAM 1 MG PO TABS: 0.0000 mg | ORAL_TABLET | Freq: Two times a day (BID) | ORAL | Status: DC

## 2016-08-01 MED ORDER — ONDANSETRON 4 MG PO TBDP: 4.0000 mg | ORAL_TABLET | Freq: Four times a day (QID) | ORAL | Status: AC | PRN

## 2016-08-01 MED ORDER — ADULT MULTIVITAMIN W/MINERALS CH
1.0000 | ORAL_TABLET | Freq: Every day | ORAL | Status: DC
  Administered 2016-08-02 – 2016-08-09 (×8): 1 via ORAL
  Filled 2016-08-01 (×11): qty 1

## 2016-08-01 MED ORDER — LORAZEPAM 1 MG PO TABS
1.0000 mg | ORAL_TABLET | Freq: Two times a day (BID) | ORAL | Status: AC
  Administered 2016-08-03 (×2): 1 mg via ORAL
  Filled 2016-08-01 (×2): qty 1

## 2016-08-01 MED ORDER — LORAZEPAM 2 MG/ML IJ SOLN: 1.0000 mg | Freq: Four times a day (QID) | INTRAMUSCULAR | Status: DC | PRN

## 2016-08-01 MED ORDER — LORAZEPAM 1 MG PO TABS
1.0000 mg | ORAL_TABLET | Freq: Three times a day (TID) | ORAL | Status: AC
  Administered 2016-08-02 (×3): 1 mg via ORAL
  Filled 2016-08-01 (×3): qty 1

## 2016-08-01 MED ORDER — QUETIAPINE FUMARATE 25 MG PO TABS
25.0000 mg | ORAL_TABLET | Freq: Every evening | ORAL | Status: DC | PRN
  Administered 2016-08-02 – 2016-08-06 (×7): 25 mg via ORAL
  Filled 2016-08-01 (×16): qty 1

## 2016-08-01 MED ORDER — NICOTINE POLACRILEX 2 MG MT GUM
CHEWING_GUM | OROMUCOSAL | Status: AC
Start: 2016-08-01 — End: 2016-08-01
  Filled 2016-08-01: qty 1

## 2016-08-01 MED ORDER — VITAMIN B-1 100 MG PO TABS
100.0000 mg | ORAL_TABLET | Freq: Every day | ORAL | Status: DC
  Administered 2016-08-02 – 2016-08-09 (×8): 100 mg via ORAL
  Filled 2016-08-01 (×10): qty 1

## 2016-08-01 MED ORDER — FLUOXETINE HCL 20 MG PO CAPS
20.0000 mg | ORAL_CAPSULE | Freq: Every day | ORAL | Status: DC
  Administered 2016-08-02 – 2016-08-09 (×8): 20 mg via ORAL
  Filled 2016-08-01 (×8): qty 1
  Filled 2016-08-01: qty 7
  Filled 2016-08-01 (×3): qty 1

## 2016-08-01 MED ORDER — LORAZEPAM 1 MG PO TABS: 1.0000 mg | ORAL_TABLET | Freq: Four times a day (QID) | ORAL | Status: AC | PRN

## 2016-08-01 MED ORDER — LORAZEPAM 1 MG PO TABS: 0.0000 mg | ORAL_TABLET | Freq: Four times a day (QID) | ORAL | Status: DC

## 2016-08-01 MED ORDER — LORAZEPAM 1 MG PO TABS
1.0000 mg | ORAL_TABLET | Freq: Every day | ORAL | Status: AC
Start: 2016-08-04 — End: 2016-08-04
  Administered 2016-08-04: 1 mg via ORAL
  Filled 2016-08-01: qty 1

## 2016-08-01 NOTE — ED Notes (Signed)
[facility] MD order. Pt signed for personal belongings and belongings given to transport service for transport. Pt signed e-signature. Ambulatory off unit with transport service.

## 2016-08-01 NOTE — BH Assessment (Signed)
[facility] Assessment Progress Note  Per Nelly RoutArchana Kumar, MD, this pt requires psychiatric hospitalization at this time.  Berneice Heinrichina Tate, RN, [id number]  Pt has signed Voluntary Admission and Consent for Treatment, as well as Consent to Release Information to [facility], his outpatient provider, and a notification call has been placed.  [facility].  Pt's nurse, Morrie Sheldonshley, has been notified, and agrees to send original paperwork along with pt via [facility], and to call report to [phone].  Doylene Canninghomas Dominika Losey, MA Triage Specialist [phone]

## 2016-08-01 NOTE — Progress Notes (Signed)
Admission Note:  [age] yr male who presents voluntary, in no acute distress, for the treatment of SI, Substance Abuse, and Depression. Patient reports SI with a plan to "take as many pills as I can no matter what they are" or to "Hang self from top bunk".  Patient appears depressed. Patient was calm and cooperative with admission process. Patient presents with passive SI and contracts for safety upon admission. Patient denies Visual Hallucinations.  Patient reports AH stating "every once in awhile I here someone say my name or think someone's talking to me".  Patient reports stressor as recent breakup with girlfriend over girlfriends cocaine use.  Patient reports increased alcohol use.  Patient has past medical hx of DM without complication and GAD. Patient's CBG on admission was 166. Patient reports that he is HOH in left ear.  Patient reports marijuana use daily.  Patient currently unemployed and lives with his mother. Patient plan to return to mother's house on discharge. [facility] patient would like to work on "mental health", "Anger issues", and "co dependence".  Skin was assessed.  Patient has red blotches over upper torso.  Patient reports blotches are from "fungal infection from the sun. Doctors gave me a pill, it went away, and now it's back". Patient has multiple tattoos; r arm, left shoulder, and right calf. Patient searched and no contraband found, POC and unit policies explained and understanding verbalized. Consents obtained.  Report given to accepting nurse. Patient placed on q15 minute safety checks.  Patient had no additional questions or concerns. Safety maintained on unit.

## 2016-08-01 NOTE — Tx Team (Signed)
Initial Interdisciplinary Treatment Plan   PATIENT STRESSORS: Financial difficulties Loss of relationship Occupational concerns   PATIENT STRENGTHS: Ability for insight Communication skills Motivation for treatment/growth Physical Health Supportive family/friends   PROBLEM LIST: Problem List/Patient Goals Date to be addressed Date deferred Reason deferred Estimated date of resolution  At risk for suicide 08/01/2016  08/01/2016   D/C  Substance Abuse 08/01/2016  08/01/2016   D/C  "Mental Health" 08/01/2016  08/01/2016   D/C  "Anger issues" 08/01/2016  08/01/2016   D/C  "Co dependence" 08/01/2016  08/01/2016   D/C                           DISCHARGE CRITERIA:  Ability to meet basic life and health needs Improved stabilization in mood, thinking, and/or behavior Motivation to continue treatment in a less acute level of care Need for constant or close observation no longer present Verbal commitment to aftercare and medication compliance Withdrawal symptoms are absent or subacute and managed without 24-hour nursing intervention  PRELIMINARY DISCHARGE PLAN: Attend 12-step recovery group Outpatient therapy Return to previous living arrangement  PATIENT/FAMIILY INVOLVEMENT: This treatment plan has been presented to and reviewed with the patient, [name].  The patient and family have been given the opportunity to ask questions and make suggestions.  Micheal Lopez, Micheal Lopez P 08/01/2016, 12:49 PM

## 2016-08-02 LAB — GLUCOSE, CAPILLARY
GLUCOSE-CAPILLARY: 413 mg/dL — AB (ref 65–99)
Glucose-Capillary: 84 mg/dL (ref 65–99)
Glucose-Capillary: 196 mg/dL — ABNORMAL HIGH (ref 65–99)
Glucose-Capillary: 284 mg/dL — ABNORMAL HIGH (ref 65–99)
Glucose-Capillary: 469 mg/dL — ABNORMAL HIGH (ref 65–99)

## 2016-08-02 MED ORDER — INSULIN ASPART 100 UNIT/ML ~~LOC~~ SOLN
10.0000 [IU] | Freq: Once | SUBCUTANEOUS | Status: AC
  Administered 2016-08-02: 10 [IU] via SUBCUTANEOUS

## 2016-08-02 MED ORDER — GABAPENTIN 300 MG PO CAPS
300.0000 mg | ORAL_CAPSULE | Freq: Every day | ORAL | Status: DC
  Administered 2016-08-02 – 2016-08-08 (×7): 300 mg via ORAL
  Filled 2016-08-02 (×8): qty 1
  Filled 2016-08-02: qty 7
  Filled 2016-08-02 (×3): qty 1

## 2016-08-02 NOTE — [facility] Group Notes (Signed)
[facility] Therapy 08/02/2016 1:15pm  Type of Therapy: Mental Health Association Presentation  Participation Level: Active  Participation Quality: Attentive  Affect: Appropriate  Cognitive: Oriented  Insight: Developing/Improving  Engagement in Therapy: Engaged  Modes of Intervention: Discussion, Education and Socialization  Summary of Progress/Problems: Mental Health Association (MHA) Speaker came to talk about his personal journey with substance abuse and addiction. The pt processed ways by which to relate to the speaker. MHA speaker provided handouts and educational information pertaining to groups and services offered by the [facility]. Pt was engaged in speaker's presentation and was receptive to resources provided.    Chad CordialLauren Carter, LCSWA 08/02/2016 1:24 PM

## 2016-08-02 NOTE — H&P (Signed)
Psychiatric Admission Assessment Adult  Patient Identification: Micheal Lopez MRN:  [id number] Date of Evaluation:  08/02/2016 Chief Complaint:  MDD RECURRENT SEVERE Principal Diagnosis: Major depressive disorder, recurrent severe without psychotic features (HCC) Diagnosis:   Patient Active Problem List   Diagnosis Date Noted  . 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 Alcohol intoxication (HCC) [F10.129]   . Alcohol use disorder, severe, dependence (HCC) [F10.20] 09/21/2015  . Suicidal ideation [R45.851]    History of Present Illness:  Micheal SongDevon Clites is an 30 y.o. male  complains of having suicidal thoughts for the past day.  He says he may want to hang himself but that he does not have a specific plan.  Patient has been drinking and using THC.  He states that he was last here in Sept for detox.  He reports that from Oct to April 2017, he was sober.  However, when he experiences stressors such as breaking up with his GF, he goes binge drinking.  dmits to having suicidal thoughts.  Per chart review, patient has attempted to kill himself in the past, twice.    Patient states he is interested in rehab program and trying to re define himself and learn better coping skills.  Patient says that he drinks "at least" a 12 pack at a time for about 3-4 times per week.  Patient says he drank a 12 pack within the last 24 hours.  Patient smokes marijuana daily, up to about 4 times per day.  Patient says he last smoked in the last 24 hours but his UDS is clear.  Associated Signs/Symptoms: Depression Symptoms:  depressed mood, anxiety, (Hypo) Manic Symptoms:  Labiality of Mood, Anxiety Symptoms:  Excessive Worry, Psychotic Symptoms:  Hallucinations: Auditory PTSD Symptoms: NA Total Time spent with patient: 30 minutes  Past Psychiatric History: see HPI  Is the patient at risk to self? Yes.    Has the patient been a risk to self  in the past 6 months? Yes.    Has the patient been a risk to self within the distant past? Yes.    Is the patient a risk to others? No.  Has the patient been a risk to others in the past 6 months? No.  Has the patient been a risk to others within the distant past? No.   Prior Inpatient Therapy:   Prior Outpatient Therapy:    Alcohol Screening: 1. How often do you have a drink containing alcohol?: 4 or more times a week 2. How many drinks containing alcohol do you have on a typical day when you are drinking?: 10 or more 3. How often do you have six or more drinks on one occasion?: Daily or almost daily Preliminary Score: 8 4. How often during the last year have you found that you were not able to stop drinking once you had started?: Daily or almost daily 5. How often during the last year have you failed to do what was normally expected from you becasue of drinking?: Monthly 6. How often during the last year have you needed a first drink in the morning to get yourself going after a heavy drinking session?: Never 7. How often during the last year have you had a feeling of guilt of remorse after drinking?: Weekly 8. How often during the last year have you been unable to remember what happened the night before because you had been drinking?: Monthly 9. Have you or someone else  been injured as a result of your drinking?: Yes, during the last year 10. Has a relative or friend or a doctor or another health worker been concerned about your drinking or suggested you cut down?: Yes, during the last year Alcohol Use Disorder Identification Test Final Score (AUDIT): 31 Brief Intervention: Yes Substance Abuse History in the last 12 months:  Yes.   Consequences of Substance Abuse: inpatient admission Previous Psychotropic Medications: Yes  Psychological Evaluations: Yes  Past Medical History:  Past Medical History:  Diagnosis Date  . Depression    per pt.  . Diabetes mellitus without complication  (HCC)    Type I   History reviewed. No pertinent surgical history. Family History:  Family History  Problem Relation Age of Onset  . Cancer Maternal Grandmother     BREAST    Family Psychiatric  History: Patient thinks that his mom or dad may have had depression Tobacco Screening: Have you used any form of tobacco in the last 30 days? (Cigarettes, Smokeless Tobacco, Cigars, and/or Pipes): Yes Tobacco use, Select all that apply: smokeless tobacco use daily ("Vap") Are you interested in Tobacco Cessation Medications?: No, patient refused Counseled patient on smoking cessation including recognizing danger situations, developing coping skills and basic information about quitting provided: Refused/Declined practical counseling Social History:  History  Alcohol Use  . Yes    Comment: occasional      History  Drug Use  . Frequency: 7.0 times per week  . Types: Marijuana    Additional Social History:                           Allergies:  No Known Allergies Lab Results:  Results for orders placed or performed during the hospital encounter of 08/01/16 (from the past 48 hour(s))  Glucose, capillary     Status: Abnormal   Collection Time: 08/01/16 12:04 PM  Result Value Ref Range   Glucose-Capillary 166 (H) 65 - 99 mg/dL   Comment 1 Notify RN    Comment 2 Document in Chart   Glucose, capillary     Status: Abnormal   Collection Time: 08/01/16  5:14 PM  Result Value Ref Range   Glucose-Capillary 277 (H) 65 - 99 mg/dL   Comment 1 Notify RN   Glucose, capillary     Status: Abnormal   Collection Time: 08/01/16  9:43 PM  Result Value Ref Range   Glucose-Capillary 369 (H) 65 - 99 mg/dL   Comment 1 Notify RN   Glucose, capillary     Status: None   Collection Time: 08/02/16  6:03 AM  Result Value Ref Range   Glucose-Capillary 84 65 - 99 mg/dL  Glucose, capillary     Status: Abnormal   Collection Time: 08/02/16 12:13 PM  Result Value Ref Range   Glucose-Capillary 196 (H) 65  - 99 mg/dL   Comment 1 Notify RN     Blood Alcohol level:  Lab Results  Component Value Date   ETH 119 (H) 07/31/2016   ETH <5 09/20/2015    Metabolic Disorder Labs:  Lab Results  Component Value Date   HGBA1C 8.5 (H) 09/23/2015   MPG 197 09/23/2015   No results found for: PROLACTIN Lab Results  Component Value Date   CHOL 146 10/17/2015   TRIG 87 10/17/2015   HDL 68 10/17/2015   CHOLHDL 2.1 10/17/2015   VLDL 17 10/17/2015   LDLCALC 61 10/17/2015   LDLCALC 73 09/23/2015  Current Medications: Current Facility-Administered Medications  Medication Dose Route Frequency Provider Last Rate Last Dose  . FLUoxetine (PROZAC) capsule 20 mg  20 mg Oral Daily Charm Rings, NP   20 mg at 08/02/16 1610  . folic acid (FOLVITE) tablet 1 mg  1 mg Oral Daily Charm Rings, NP   1 mg at 08/02/16 9604  . hydrOXYzine (ATARAX/VISTARIL) tablet 25 mg  25 mg Oral Q6H PRN Craige Cotta, MD      . ibuprofen (ADVIL,MOTRIN) tablet 600 mg  600 mg Oral Q8H PRN Charm Rings, NP      . insulin aspart (novoLOG) injection 0-15 Units  0-15 Units Subcutaneous TID WC Charm Rings, NP   3 Units at 08/02/16 1219  . insulin aspart (novoLOG) injection 4 Units  4 Units Subcutaneous TID WC Charm Rings, NP   4 Units at 08/02/16 1220  . insulin glargine (LANTUS) injection 35 Units  35 Units Subcutaneous QHS Charm Rings, NP   35 Units at 08/01/16 2159  . loperamide (IMODIUM) capsule 2-4 mg  2-4 mg Oral PRN Craige Cotta, MD      . LORazepam (ATIVAN) tablet 1 mg  1 mg Oral Q6H PRN Craige Cotta, MD      . LORazepam (ATIVAN) tablet 1 mg  1 mg Oral TID Craige Cotta, MD   1 mg at 08/02/16 1211   Followed by  . [START ON 08/03/2016] LORazepam (ATIVAN) tablet 1 mg  1 mg Oral BID Craige Cotta, MD       Followed by  . [START ON 08/04/2016] LORazepam (ATIVAN) tablet 1 mg  1 mg Oral Daily Fernando A Cobos, MD      . magnesium hydroxide (MILK OF MAGNESIA) suspension 30 mL  30 mL Oral Daily PRN  Charm Rings, NP      . multivitamin with minerals tablet 1 tablet  1 tablet Oral Daily Craige Cotta, MD   1 tablet at 08/02/16 5409  . nicotine polacrilex (NICORETTE) gum 2 mg  2 mg Oral PRN Craige Cotta, MD   2 mg at 08/02/16 1531  . ondansetron (ZOFRAN-ODT) disintegrating tablet 4 mg  4 mg Oral Q6H PRN Craige Cotta, MD      . QUEtiapine (SEROQUEL) tablet 25 mg  25 mg Oral QHS,MR X 1 Spencer E Simon, PA-C      . thiamine (B-1) injection 100 mg  100 mg Intramuscular Once Rockey Situ Cobos, MD      . thiamine (VITAMIN B-1) tablet 100 mg  100 mg Oral Daily Craige Cotta, MD   100 mg at 08/02/16 8119   PTA Medications: Prescriptions Prior to Admission  Medication Sig Dispense Refill Last Dose  . FLUoxetine (PROZAC) 10 MG capsule Take 1 capsule (10 mg total) by mouth daily. (Patient not taking: Reported on 07/31/2016) 30 capsule 0 Not Taking at Unknown time  . FLUoxetine (PROZAC) 20 MG tablet Take 20 mg by mouth daily.   07/30/2016 at Unknown time  . ibuprofen (ADVIL,MOTRIN) 800 MG tablet Take 1 tablet (800 mg total) by mouth every 8 (eight) hours as needed for mild pain. (Patient not taking: Reported on 07/31/2016) 30 tablet 0 Not Taking at Unknown time  . insulin aspart (NOVOLOG) 100 UNIT/ML injection Inject 0-15 Units into the skin 3 (three) times daily before meals.   07/30/2016 at Unknown time  . insulin glargine (LANTUS) 100 UNIT/ML injection Inject 35 Units into the skin at  bedtime.   Past Week at Unknown time  . insulin NPH Human (NOVOLIN N) 100 UNIT/ML injection Inject 0.2 mLs (20 Units total) into the skin 2 (two) times daily at 8 am and 10 pm. (Patient not taking: Reported on 07/31/2016) 10 mL 11 Not Taking at Unknown time    Musculoskeletal: Strength & Muscle Tone: within normal limits Gait & Station: normal Patient leans: N/A  Psychiatric Specialty Exam: Physical Exam  ROS  Blood pressure 125/69, pulse 65, temperature 98.7 F (37.1 C), temperature source Oral, resp. rate  16, height 5' 9.5" (1.765 m), weight 78.9 kg (174 lb), SpO2 100 %.Body mass index is 25.33 kg/m.  General Appearance: Fairly Groomed  Eye Contact:  Good  Speech:  Clear and Coherent  Volume:  Normal  Mood:  Anxious, Depressed and Euthymic  Affect:  Appropriate and Congruent  Thought Process:  Coherent  Orientation:  Full (Time, Place, and Person)  Thought Content:  Rumination  Suicidal Thoughts:  No  Homicidal Thoughts:  No  Memory:  Immediate;   Fair Recent;   Fair Remote;   Fair  Judgement:  Fair  Insight:  Fair  Psychomotor Activity:  Normal  Concentration:  Concentration: Fair and Attention Span: Fair  Recall:  [employer] of Knowledge:  Fair  Language:  Fair  Akathisia:  No  Handed:  Right  AIMS (if indicated):     Assets:  Communication Skills Resilience  ADL's:  Intact  Cognition:  WNL  Sleep:  Number of Hours: 5   Treatment Plan Summary:  Admit for crisis management and mood stabilization. Medication management to re-stabilize current mood symptoms Group counseling sessions for coping skills Medical consults as needed Review and reinstate any pertinent home medications for other health problems   Observation Level/Precautions:  15 minute checks  Laboratory:  per ED  Psychotherapy:  group  Medications:  As per medlist - Prozac 20 mg depression, Depression 300 mg HS anxiety and alcohol cravings, Seroquel 25 mg mood stabilization, Ativan CIWA protocol  Consultations:  As needed  Discharge Concerns:  safety  Estimated LOS:  Other:     I certify that inpatient services furnished can reasonably be expected to improve the patient's condition.    [facility], NP [facility] 8/10/20174:58 PM

## 2016-08-02 NOTE — [facility] Group Notes (Signed)
The focus of this group is to educate the patient on the purpose and policies of crisis stabilization and provide a format to answer questions about their admission.  The group details unit policies and expectations of patients while admitted.  Patient attended 0900 nurse education orientation group this morning.  Patient actively participated and had appropriate affect.  Patient was alert.  Patient had appropiaite insight and appropriate engagement.  Today patient will work on 3 goals for discharge.   

## 2016-08-02 NOTE — [facility] Suicide Risk Assessment (Signed)
[facility] INPATIENT:  Family/Significant Other Suicide Prevention Education  Suicide Prevention Education:  Patient Refusal for Family/Significant Other Suicide Prevention Education: The patient Micheal Lopez has refused to provide written consent for family/significant other to be provided Family/Significant Other Suicide Prevention Education during admission and/or prior to discharge.  Physician notified. SPE reviewed with patient and brochure provided. Patient encouraged to return to hospital if having suicidal thoughts, patient verbalized his/her understanding and has no further questions at this time.   [name] [name] 08/02/2016, 2:13 PM

## 2016-08-02 NOTE — Tx Team (Signed)
Interdisciplinary Treatment Plan Update (Adult) Date: 08/02/2016    Time Reviewed: 9:30 AM  Progress in Treatment: Attending groups: Continuing to assess, patient new to milieu Participating in groups: Continuing to assess, patient new to milieu Taking medication as prescribed: Yes Tolerating medication: Yes Family/Significant other contact made: No, patient has declined collateral contacts Patient understands diagnosis: Yes Discussing patient identified problems/goals with staff: Yes Medical problems stabilized or resolved: Yes Denies suicidal/homicidal ideation: Yes Issues/concerns per patient self-inventory: Yes Other:  New problem(s) identified: N/A  Discharge Plan or Barriers: Home with outpatient services.   Reason for Continuation of Hospitalization:  Depression Anxiety Medication Stabilization   Comments: N/A  Estimated length of stay: 2-3 days   Patient is a 30 year old male who presented to the hospital with increased depression and SI. Primary triggers for admission include recent break up. Patient will benefit from crisis stabilization, medication evaluation, group therapy and psycho education in addition to case management for discharge planning. At discharge, it is recommended that Pt remain compliant with established discharge plan and continued treatment.   Review of initial/current patient goals per problem list:  1. Goal(s): Patient will participate in aftercare plan   Met: Yes   Target date: 3-5 days post admission date   As evidenced by: Patient will participate within aftercare plan AEB aftercare provider and housing plan at discharge being identified.  8/10: Goal met. Home with outpatient services.    2. Goal (s): Patient will exhibit decreased depressive symptoms and suicidal ideations.   Met: No   Target date: 3-5 days post admission date   As evidenced by: Patient will utilize self rating of depression at 3 or below and demonstrate  decreased signs of depression or be deemed stable for discharge by MD.  8/10: Goal not met: Pt presents with flat affect and depressed mood.  Pt admitted with high levels of depression. Pt to show decreased sign of depression and a rating of 3 or less before d/c.      4. Goal(s): Patient will demonstrate decreased signs of withdrawal due to substance abuse   Met: Yes   Target date: 3-5 days post admission date   As evidenced by: Patient will produce a CIWA/COWS score of 0, have stable vitals signs, and no symptoms of withdrawal  8/10: Goal met. No withdrawal symptoms reported at this time per medical chart.   Attendees: Patient:    Family:    Physician: Dr. Parke Poisson; Dr. Modesta Messing; Dr. Shea Evans 08/02/2016 9:30 AM  Nursing: Grayland Ormond, Mayra Neer, [place], RN 08/02/2016 9:30 AM  Clinical Social Worker: Erasmo Downer Amil Bouwman, LCSW 08/02/2016 9:30 AM  Other: Peri Maris, LCSW; [place], LCSW  08/02/2016 9:30 AM  Other: 08/02/2016 9:30 AM  Other:  08/02/2016 9:30 AM  Other: Agustina Caroli, May Augustin, NP 08/02/2016 9:30 AM  Other:       Scribe for Treatment Team:  Tilden Fossa, [place]

## 2016-08-02 NOTE — Progress Notes (Signed)
Nutrition Education Note  Pt attended group focusing on general, healthful nutrition education.  RD emphasized the importance of eating regular meals and snacks throughout the day. Consuming sugar-free beverages and incorporating fruits and vegetables into diet when possible. Provided examples of healthy snacks. Patient encouraged to leave group with a goal to improve nutrition/healthy eating.   Diet Order: Diet Carb Modified Fluid consistency:: Thin; Room service appropriate?: Yes Pt is also offered choice of unit snacks mid-morning and mid-afternoon.  Pt is eating as desired.   If additional nutrition issues arise, please consult RD.  Rhyann Berton, MS, RD, LDN Pager: [phone] After Hours Pager: [phone]    
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
## 2016-08-02 NOTE — Progress Notes (Signed)
D:  Patient's self inventory sheet, patient sleeps good, sleep medication is helpful.  Good appetite, normal energy level, good concentration.  Rated depression 7, hopeless 8, anxiety 9.  Withdrawals, cravings, agitation, nausea, irritability.  Denied SI.  Denied physical problems.  Denied pain.  Goal is not drinking one day at a time.  Plans to stay here.  "I need help with codependency."   A:  Medications administered per MD orders.  Emotional support and encouragement given patient. R:  Denied SI and HI, contracts for safety.  Denied A/V hallucinations.  Safety maintained with 15 minute checks.

## 2016-08-02 NOTE — Plan of Care (Signed)
Problem: Education: Goal: Ability to make informed decisions regarding treatment will improve Outcome: Progressing Nurse discussed suicidal thoughts/depression/anxiety/coping skills with patient.    
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
## 2016-08-03 DIAGNOSIS — F332 Major depressive disorder, recurrent severe without psychotic features: Principal | ICD-10-CM

## 2016-08-03 LAB — GLUCOSE, CAPILLARY
GLUCOSE-CAPILLARY: 201 mg/dL — AB (ref 65–99)
Glucose-Capillary: 370 mg/dL — ABNORMAL HIGH (ref 65–99)
Glucose-Capillary: 201 mg/dL — ABNORMAL HIGH (ref 65–99)
Glucose-Capillary: 342 mg/dL — ABNORMAL HIGH (ref 65–99)

## 2016-08-03 NOTE — Progress Notes (Signed)
D:Pt has been in his room much of the morning. Pt reports that he wants long term treatment to help with substance abuse. Pt rates depression as a 6 and anxiety as a 9 on 0-10 scale scale with 10 being the most. He denies detox symptoms this morning.  A:Offered support, encouragement and 15 minute checks.  R:Pt denies si and hi. Safety maintained on the unit.

## 2016-08-03 NOTE — Progress Notes (Signed)
D-pt seems anxious A-pt took his medications, his bs have been running very high, Drenda FreezeFran, NP notified; pt attended karioke night and sang a couple of songs, he seemed to enjoy himself R-cont to monitor for safety

## 2016-08-03 NOTE — Progress Notes (Addendum)
[facility] MD Progress Note  08/03/2016 2:57 PM Micheal Lopez  MRN:  [id number] Subjective:  Patient reports he is feeling better today. At this time denies medication side effects. Objective : I have discussed case with treatment team and have met with patient . 30 year old male , history of alcohol dependence, depression, had been sober for several months until earlier this year, presented with depression and suicidal ideations . At this time patient reports partial improvement, and denies any active suicidal ideations or self injurious ideations and contracts for safety on the unit . Today he focuses on having difficulty dealing with negative emotions, such as feeling sad or rejected, as recently happened during relationship break up. States these circumstances, emotions, often lead him  To relapse . Currently tolerating medications well, denies side effects. No disruptive or agitated behaviors on unit. Going to some groups, presents motivated in treatment .  Principal Problem: Major depressive disorder, recurrent severe without psychotic features ([place]) Diagnosis:   Patient Active Problem List   Diagnosis Date Noted  . Major depressive disorder, recurrent severe without psychotic features ([place]) [F33.2] 07/31/2016  . Alcohol intoxication ([place]) [F10.129]   . Alcohol use disorder, severe, dependence ([place]) [F10.20] 09/21/2015  . Suicidal ideation [R45.851]    Total Time spent with patient: 20 minutes    Past Medical History:  Past Medical History:  Diagnosis Date  . Depression    per pt.  . Diabetes mellitus without complication (HCC)    Type I   History reviewed. No pertinent surgical history. Family History:  Family History  Problem Relation Age of Onset  . Cancer Maternal Grandmother     BREAST     Social History:  History  Alcohol Use  . Yes    Comment: occasional      History  Drug Use  . Frequency: 7.0 times per week  . Types: Marijuana    Social History   Social  History  . Marital status: Legally Separated    Spouse name: N/A  . Number of children: N/A  . Years of education: N/A   Social History Main Topics  . Smoking status: Current Every Day Smoker    Types: E-cigarettes  . Smokeless tobacco: Never Used  . Alcohol use Yes     Comment: occasional   . Drug use:     Frequency: 7.0 times per week    Types: Marijuana  . Sexual activity: No   Other Topics Concern  . None   Social History Narrative  . None   Additional Social History:   Sleep: Good  Appetite:  Good  Current Medications: Current Facility-Administered Medications  Medication Dose Route Frequency Provider Last Rate Last Dose  . FLUoxetine (PROZAC) capsule 20 mg  20 mg Oral Daily Patrecia Pour, NP   20 mg at 08/03/16 0845  . folic acid (FOLVITE) tablet 1 mg  1 mg Oral Daily Patrecia Pour, NP   1 mg at 08/03/16 0845  . gabapentin (NEURONTIN) capsule 300 mg  300 mg Oral QHS Kerrie Buffalo, NP   300 mg at 08/02/16 2206  . hydrOXYzine (ATARAX/VISTARIL) tablet 25 mg  25 mg Oral Q6H PRN Jenne Campus, MD      . ibuprofen (ADVIL,MOTRIN) tablet 600 mg  600 mg Oral Q8H PRN Patrecia Pour, NP      . insulin aspart (novoLOG) injection 0-15 Units  0-15 Units Subcutaneous TID WC Patrecia Pour, NP   15 Units at 08/03/16 1210  .  insulin aspart (novoLOG) injection 4 Units  4 Units Subcutaneous TID WC Patrecia Pour, NP   4 Units at 08/03/16 1212  . insulin glargine (LANTUS) injection 35 Units  35 Units Subcutaneous QHS Patrecia Pour, NP   35 Units at 08/02/16 2220  . loperamide (IMODIUM) capsule 2-4 mg  2-4 mg Oral PRN Jenne Campus, MD      . LORazepam (ATIVAN) tablet 1 mg  1 mg Oral Q6H PRN Jenne Campus, MD      . LORazepam (ATIVAN) tablet 1 mg  1 mg Oral BID Jenne Campus, MD   1 mg at 08/03/16 0845   Followed by  . [START ON 08/04/2016] LORazepam (ATIVAN) tablet 1 mg  1 mg Oral Daily Fernando A Cobos, MD      . magnesium hydroxide (MILK OF MAGNESIA) suspension 30 mL   30 mL Oral Daily PRN Patrecia Pour, NP      . multivitamin with minerals tablet 1 tablet  1 tablet Oral Daily Jenne Campus, MD   1 tablet at 08/03/16 0845  . nicotine polacrilex (NICORETTE) gum 2 mg  2 mg Oral PRN Jenne Campus, MD   2 mg at 08/03/16 1332  . ondansetron (ZOFRAN-ODT) disintegrating tablet 4 mg  4 mg Oral Q6H PRN Jenne Campus, MD      . QUEtiapine (SEROQUEL) tablet 25 mg  25 mg Oral QHS,MR X 1 Spencer E Simon, PA-C   25 mg at 08/02/16 2327  . thiamine (B-1) injection 100 mg  100 mg Intramuscular Once Jenne Campus, MD      . thiamine (VITAMIN B-1) tablet 100 mg  100 mg Oral Daily Jenne Campus, MD   100 mg at 08/03/16 0845    Lab Results:  Results for orders placed or performed during the hospital encounter of 08/01/16 (from the past 48 hour(s))  Glucose, capillary     Status: Abnormal   Collection Time: 08/01/16  5:14 PM  Result Value Ref Range   Glucose-Capillary 277 (H) 65 - 99 mg/dL   Comment 1 Notify RN   Glucose, capillary     Status: Abnormal   Collection Time: 08/01/16  9:43 PM  Result Value Ref Range   Glucose-Capillary 369 (H) 65 - 99 mg/dL   Comment 1 Notify RN   Glucose, capillary     Status: None   Collection Time: 08/02/16  6:03 AM  Result Value Ref Range   Glucose-Capillary 84 65 - 99 mg/dL  Glucose, capillary     Status: Abnormal   Collection Time: 08/02/16 12:13 PM  Result Value Ref Range   Glucose-Capillary 196 (H) 65 - 99 mg/dL   Comment 1 Notify RN   Glucose, capillary     Status: Abnormal   Collection Time: 08/02/16  5:19 PM  Result Value Ref Range   Glucose-Capillary 284 (H) 65 - 99 mg/dL   Comment 1 Notify RN   Glucose, capillary     Status: Abnormal   Collection Time: 08/02/16 10:16 PM  Result Value Ref Range   Glucose-Capillary 413 (H) 65 - 99 mg/dL   Comment 1 Notify RN   Glucose, capillary     Status: Abnormal   Collection Time: 08/02/16 11:20 PM  Result Value Ref Range   Glucose-Capillary 469 (H) 65 - 99 mg/dL    Comment 1 Notify RN   Glucose, capillary     Status: Abnormal   Collection Time: 08/03/16  5:57 AM  Result  Value Ref Range   Glucose-Capillary 201 (H) 65 - 99 mg/dL   Comment 1 Notify RN    Comment 2 Document in Chart   Glucose, capillary     Status: Abnormal   Collection Time: 08/03/16 12:02 PM  Result Value Ref Range   Glucose-Capillary 370 (H) 65 - 99 mg/dL    Blood Alcohol level:  Lab Results  Component Value Date   ETH 119 (H) 07/31/2016   ETH <5 59/56/3875    Metabolic Disorder Labs: Lab Results  Component Value Date   HGBA1C 8.5 (H) 09/23/2015   MPG 197 09/23/2015   No results found for: PROLACTIN Lab Results  Component Value Date   CHOL 146 10/17/2015   TRIG 87 10/17/2015   HDL 68 10/17/2015   CHOLHDL 2.1 10/17/2015   VLDL 17 10/17/2015   LDLCALC 61 10/17/2015   LDLCALC 73 09/23/2015    Physical Findings: AIMS: Facial and Oral Movements Muscles of Facial Expression: None, normal Lips and Perioral Area: None, normal Jaw: None, normal Tongue: None, normal,Extremity Movements Upper (arms, wrists, hands, fingers): None, normal Lower (legs, knees, ankles, toes): None, normal, Trunk Movements Neck, shoulders, hips: None, normal, Overall Severity Severity of abnormal movements (highest score from questions above): None, normal Incapacitation due to abnormal movements: None, normal Patient's awareness of abnormal movements (rate only patient's report): No Awareness, Dental Status Current problems with teeth and/or dentures?: No Does patient usually wear dentures?: No  CIWA:  CIWA-Ar Total: 1 COWS:  COWS Total Score: 1  Musculoskeletal: Strength & Muscle Tone: within normal limits- not currently presenting with tremors, diaphoresis, or acute psychomotor restlessness  Gait & Station: normal Patient leans: N/A  Psychiatric Specialty Exam: Physical Exam  ROS denies headache, denies visual disturbances, no vomiting .  Blood pressure 134/82, pulse 95,  temperature 97.9 F (36.6 C), temperature source Oral, resp. rate 18, height 5' 9.5" (1.765 m), weight 174 lb (78.9 kg), SpO2 100 %.Body mass index is 25.33 kg/m.  General Appearance: Fairly Groomed  Eye Contact:  Good  Speech:  Normal Rate  Volume:  Normal  Mood:  reports some depression , but acknowledges he feels better today   Affect:  Appropriate and mildly anxious   Thought Process:  Linear  Orientation:  Full (Time, Place, and Person)  Thought Content:  denies hallucinations, no delusions, not internally preoccupied   Suicidal Thoughts:  No- at this time denies suicidal ideations, denies self injurious ideations, contracts for safety on unit   Homicidal Thoughts:  No- denies homicidal ideations   Memory:  recent and remote grossly intact   Judgement:  Other:  improving   Insight:  improving   Psychomotor Activity:  Normal- no current tremors or diaphoresis   Concentration:  Concentration: Good and Attention Span: Good  Recall:  Good  Fund of Knowledge:  Good  Language:  Good  Akathisia:  Negative  Handed:  Right  AIMS (if indicated):     Assets:  Desire for Improvement Resilience  ADL's:  Intact  Cognition:  WNL  Sleep:  Number of Hours: 6   Assessment - patient reports some ongoing depression, but feeling better than on admission , currently denies suicidal ideations, and contracts for safety on the unit,  Tolerating detox well, no current significant WDL symptoms. Patient is future oriented and is expressing interest in going to inpatient, residential rehab setting on discharge  Treatment Plan Summary: Daily contact with patient to assess and evaluate symptoms and progress in treatment, Medication management, Plan  inpatient treatment  and medication management as below  Continue to encourage group and milieu attendance to work on coping skills and symptom reduction  Continue to encourage efforts to work on relapse prevention and recovery efforts  Continue Neurontin  300  mgrs QHS for anxiety Continue Ativan detox protocol to minimize risk of alcohol WDL symptoms Continue Prozac 20 mgrs QDAY for depression  Continue Seroquel 25 mgrs QHS for insomnia, mood disorder  Continue DM management  Neita Garnet, MD 08/03/2016, 2:57 PM

## 2016-08-03 NOTE — [facility] Group Notes (Signed)
[facility] LCSW Aftercare Discharge Planning Group Note  08/03/2016  8:45 AM  Participation Quality: Did Not Attend. Patient invited to participate but declined.  Ladasha Schnackenberg, MSW, LCSW Clinical Social Worker [facility] [phone]   

## 2016-08-03 NOTE — Progress Notes (Signed)
Inpatient Diabetes Program Recommendations  AACE/ADA: New Consensus Statement on Inpatient Glycemic Control (2015)  Target Ranges:  Prepandial:   less than 140 mg/dL      Peak postprandial:   less than 180 mg/dL (1-2 hours)      Critically ill patients:  140 - 180 mg/dL   Lab Results  Component Value Date   GLUCAP 370 (H) 08/03/2016   HGBA1C 8.5 (H) 09/23/2015   Results for Micheal SongMARLOW, [name] (MRN [id number]) as of 08/03/2016 13:18  Ref. Range 08/02/2016 06:03 08/02/2016 12:13 08/02/2016 17:19 08/02/2016 22:16 08/02/2016 23:20 08/03/2016 05:57 08/03/2016 12:02  Glucose-Capillary Latest Ref Range: 65 - 99 mg/dL 84 409196 (H) 811284 (H) 914413 (H) 469 (H) 201 (H) 370 (H)   Review of Glycemic Control  Diabetes history: DM1 Outpatient Diabetes medications: Lantus 35 units QHS, Novolog 0-15 units tidwc Current orders for Inpatient glycemic control: Lantus 35 units QHS, Novolog moderate tidwc + 4units tidwc for meal coverage insulin.  Inpatient Diabetes Program Recommendations:    Increase Novolog to 6 units tidwc for meal coverage insulin. Need updated HgbA1C. Last one 09/23/2015 - 8.5%.  Will continue to follow while inpatient. Thank you. Ailene Ardshonda Dom Haverland, RD, LDN, CDE Inpatient Diabetes Coordinator [phone]

## 2016-08-03 NOTE — Progress Notes (Signed)
[facility] participated.

## 2016-08-03 NOTE — [facility] Group Notes (Signed)
[facility] LCSW Group Therapy 08/03/2016 1:15 PM Type of Therapy: Group Therapy Participation Level: Active  Participation Quality: Attentive, Sharing and Supportive  Affect: Blunted   Cognitive: Alert and Oriented  Insight: Developing/Improving and Engaged  Engagement in Therapy: Developing/Improving and Engaged  Modes of Intervention: Clarification, Confrontation, Discussion, Education, Exploration, Limit-setting, Orientation, Problem-solving, Rapport Building, Dance movement psychotherapisteality Testing, Socialization and Support  Summary of Progress/Problems: The topic for today was feelings about relapse. Pt discussed what relapse prevention is to them and identified triggers that they are on the path to relapse. Pt processed their feeling towards relapse and was able to relate to peers. Pt discussed coping skills that can be used for relapse prevention. Patient shared "I always take the easy way out" describing his addiction and lack of coping skills.     Samuella BruinKristin Maekayla Giorgio, MSW, LCSW Clinical Social Worker [facility] [phone]

## 2016-08-03 NOTE — Progress Notes (Signed)
DAR- Update note D: Pt observed sleeping in bed with eyes closed. RR even and unlabored. No distress noted  .  A: Q 15 minute checks were done for safety.  R: safety maintained on unit.  

## 2016-08-03 NOTE — Plan of Care (Signed)
Problem: Medication: Goal: Compliance with prescribed medication regimen will improve Outcome: Progressing Pt taking medications as prescribed.  Problem: Self-Concept: Goal: Ability to disclose and discuss suicidal ideas will improve Outcome: Progressing Pt denies si thoughts today.

## 2016-08-03 NOTE — Progress Notes (Signed)
Recreation Therapy Notes  Date: 08/03/16 Time: 930 Location: 300 Hall Group Room  Group Topic: Stress Management  Goal Area(s) Addresses:  Patient will verbalize importance of using healthy stress management.  Patient will identify positive emotions associated with healthy stress management.   Intervention: Guided Imagery Script  Activity :  Peaceful Place Script.  LRT introduced the technique of guided imagery to patients.  Patients were to follow along as LRT read script so they could participate in activity.  Education:  Stress Management, Discharge Planning.   Education Outcome: Needs additional education  Clinical Observations/Feedback: Pt did not attend group.    Orissa Arreaga, LRT/CTRS  

## 2016-08-04 LAB — GLUCOSE, CAPILLARY
GLUCOSE-CAPILLARY: 38 mg/dL — AB (ref 65–99)
GLUCOSE-CAPILLARY: 254 mg/dL — AB (ref 65–99)
GLUCOSE-CAPILLARY: 217 mg/dL — AB (ref 65–99)
Glucose-Capillary: 114 mg/dL — ABNORMAL HIGH (ref 65–99)
Glucose-Capillary: 283 mg/dL — ABNORMAL HIGH (ref 65–99)

## 2016-08-04 NOTE — [facility] Group Notes (Signed)
[facility] Group Notes:  (Clinical Social Work)   10/22/2015     10:00-11:00AM  Summary of Progress/Problems:   In today's process group a decisional balance exercise was used to explore in depth the perceived benefits and costs of alcohol and drugs, as well as the  benefits and costs of replacing these with healthy coping skills.  Patients initially shared their story of what has been going on in their lives and what unhealthy coping techniques led ultimately to their hospitalization.  Motivational Interviewing and the whiteboard were utilized for the exercises.  The patient was about 30 minutes late to group and did not verbally contribute to group; however he was very attentive with his eyes tracking the conversation and his affect showing interest.  Type of Therapy:  Group Therapy - Process   Participation Level:  Active  Participation Quality:  Attentive  Affect:  Appropriate  Cognitive:  Appropriate  Insight:  Developing/Improving  Engagement in Therapy:  Engaged  Modes of Intervention:  Education, Motivational Interviewing  Micheal MantleMareida Grossman-Orr, LCSW 08/04/2016, 12:30 PM

## 2016-08-04 NOTE — Progress Notes (Signed)
Patient has complained of anxiety, but denies SI, HI and AVH this shift.  Patient had no complaints this shift and no incidents of behavioral dyscontrol.   Assess patient for safety, offer medications as prescribed engage patient in 1:1 Staff talks.   Continue to monitor for safety.

## 2016-08-04 NOTE — Progress Notes (Signed)
Patient ID: Micheal SongDevon Rishel, male   DOB: 30-Apr-1986, 30 y.o.   MRN: [id number] D: Client visible on the unit, has visit from sister this shift. Client reports goal is long term treatment. Client also seen in dayroom, watching TV. A:Writer encouraged snack to prevent low blood sugar. Medication reviewed, administered as ordered. Safety will be maintained q7115min safety checks. R: client is safe on the unit attended group.

## 2016-08-04 NOTE — [facility] Group Notes (Signed)
Adult Psychoeducational Group Note  Date:  08/04/2016 Time:  [id number]  Group Topic/Focus: The focus of today's group is to help the patient develop a coping skills tool box.  By the end of the session the goal is to complete a work sheet listing 3 strengths or abilities, 3 stressors, and 5 coping skills.  The goal of the discussion is to identify how to apply strengths, abilities, and coping skills to cope with stressors.  There is some discussion about the importance of managing symptoms with medications and therapy, but the focus will be on utilizing coping skills to increase tolerance of stress as stressors occur.  Participation Level:  Active  Participation Quality:  Appropriate, Attentive, Sharing and Supportive  Affect:  Appropriate  Cognitive:  Alert and Appropriate  Insight: Appropriate and Good  Engagement in Group:  Engaged  Modes of Intervention:  Education, Electronics engineerapport Building, Socialization and Support  Additional Comments:  Patient was present entire group session.  Shared a lot and was engaged- very insightful. Completed worksheet.  Lindajo RoyalDaniel P Serria Sloma 08/04/2016, 2:43 PM

## 2016-08-04 NOTE — Progress Notes (Signed)
Inpatient Diabetes Program Recommendations  AACE/ADA: New Consensus Statement on Inpatient Glycemic Control (2015)  Target Ranges:  Prepandial:   less than 140 mg/dL      Peak postprandial:   less than 180 mg/dL (1-2 hours)      Critically ill patients:  140 - 180 mg/dL   Results for Micheal Lopez, Daeshawn (MRN [id number]) as of 08/04/2016 14:51  Ref. Range 08/03/2016 05:57 08/03/2016 12:02 08/03/2016 16:53 08/03/2016 19:59  Glucose-Capillary Latest Ref Range: 65 - 99 mg/dL 409201 (H) 811370 (H) 914201 (H) 342 (H)   Results for Micheal Lopez, Micheal Lopez (MRN [id number]) as of 08/04/2016 14:51  Ref. Range 08/04/2016 06:06 08/04/2016 06:32 08/04/2016 11:38  Glucose-Capillary Latest Ref Range: 65 - 99 mg/dL 38 (LL) 086114 (H) 578254 (H)    Home DM Meds: Lantus 35 units QHS       Novolog SSI  Current Insulin Orders: Lantus 35 units QHS      Novolog Moderate Correction Scale/ SSI (0-15 units) TID AC      Novolog 4 units tidwc     MD- Note patient with Hypoglycemia this AM.  CBG down to 38 mg/dl after receiving Lantus 35 units the night prior.  Please consider the following in-hospital insulin adjustments based on review of glucose trends and insulin given:  1. Reduce Lantus to 28 units QHS (20% reduction of dose)  2. Increase Novolog Meal Coverage to: Novolog 6 units tidwc     --Will follow patient during hospitalization--  Ambrose FinlandJeannine Johnston Raivyn Kabler RN, MSN, CDE Diabetes Coordinator Inpatient Glycemic Control Team Team Pager: [phone] (8a-5p)

## 2016-08-04 NOTE — Progress Notes (Signed)
Patient ID: Micheal SongDevon Kunzman, male   DOB: 16-Sep-1986, 30 y.o.   MRN: [id number] Hypoglycemic Event  CBG: 38  Treatment: 3 glucose tabs and 15 GM carbohydrate snack  Symptoms: Sweaty, Shaky and Hungry  Follow-up CBG: Time:0630 CBG Result:114  Possible Reasons for Event: Unknown  Comments/MD notified:Spencer Mine La MotteSimon, PA    Mickeal NeedyJohnson, Abigal Choung N

## 2016-08-04 NOTE — Progress Notes (Signed)
[facility] MD Progress Note  08/04/2016 4:58 PM Vivaan Helseth  MRN:  [id number] Subjective:  Patient reports" feeling better and better each day and Iam ready to leave."  Objective :[name] is awake, alert and oriented X4 , found attending group session.  Denies suicidal or homicidal ideation. Denies auditory or visual hallucination and does not appear to be responding to internal stimuli. Patient reports going to a residential treatment facility on [place] day. Reports he motivated to go and stay sober. Patient reports interacting well with staff and others. Patient reports he is medication compliant without mediation side effects. Patient denies depression or depressive symptoms. Reports good appetite and states he is resting well. Support, encouragement and reassurance was provided.   Principal Problem: Major depressive disorder, recurrent severe without psychotic features (HCC) Diagnosis:   Patient Active Problem List   Diagnosis Date Noted  . Major depressive disorder, recurrent severe without psychotic features (HCC) [F33.2] 07/31/2016  . Alcohol intoxication (HCC) [F10.129]   . Alcohol use disorder, severe, dependence (HCC) [F10.20] 09/21/2015  . Suicidal ideation [R45.851]    Total Time spent with patient: 20 minutes    Past Medical History:  Past Medical History:  Diagnosis Date  . Depression    per pt.  . Diabetes mellitus without complication (HCC)    Type I   History reviewed. No pertinent surgical history. Family History:  Family History  Problem Relation Age of Onset  . Cancer Maternal Grandmother     BREAST     Social History:  History  Alcohol Use  . Yes    Comment: occasional      History  Drug Use  . Frequency: 7.0 times per week  . Types: Marijuana    Social History   Social History  . Marital status: Legally Separated    Spouse name: N/A  . Number of children: N/A  . Years of education: N/A   Social History Main Topics  . Smoking status: Current  Every Day Smoker    Types: E-cigarettes  . Smokeless tobacco: Never Used  . Alcohol use Yes     Comment: occasional   . Drug use:     Frequency: 7.0 times per week    Types: Marijuana  . Sexual activity: No   Other Topics Concern  . None   Social History Narrative  . None   Additional Social History:   Sleep: Good  Appetite:  Good  Current Medications: Current Facility-Administered Medications  Medication Dose Route Frequency Provider Last Rate Last Dose  . FLUoxetine (PROZAC) capsule 20 mg  20 mg Oral Daily Charm Rings, NP   20 mg at 08/04/16 0909  . folic acid (FOLVITE) tablet 1 mg  1 mg Oral Daily Charm Rings, NP   1 mg at 08/04/16 0910  . gabapentin (NEURONTIN) capsule 300 mg  300 mg Oral QHS Adonis Brook, NP   300 mg at 08/03/16 2132  . ibuprofen (ADVIL,MOTRIN) tablet 600 mg  600 mg Oral Q8H PRN Charm Rings, NP      . insulin aspart (novoLOG) injection 0-15 Units  0-15 Units Subcutaneous TID WC Charm Rings, NP   8 Units at 08/04/16 1214  . insulin aspart (novoLOG) injection 4 Units  4 Units Subcutaneous TID WC Charm Rings, NP   4 Units at 08/04/16 1214  . insulin glargine (LANTUS) injection 35 Units  35 Units Subcutaneous QHS Charm Rings, NP   35 Units at 08/03/16 2132  . magnesium  hydroxide (MILK OF MAGNESIA) suspension 30 mL  30 mL Oral Daily PRN Charm Rings, NP      . multivitamin with minerals tablet 1 tablet  1 tablet Oral Daily Craige Cotta, MD   1 tablet at 08/04/16 0910  . nicotine polacrilex (NICORETTE) gum 2 mg  2 mg Oral PRN Craige Cotta, MD   2 mg at 08/04/16 1512  . QUEtiapine (SEROQUEL) tablet 25 mg  25 mg Oral QHS,MR X 1 Kerry Hough, PA-C   25 mg at 08/03/16 2132  . thiamine (B-1) injection 100 mg  100 mg Intramuscular Once Craige Cotta, MD      . thiamine (VITAMIN B-1) tablet 100 mg  100 mg Oral Daily Craige Cotta, MD   100 mg at 08/04/16 9604    Lab Results:  Results for orders placed or performed during the  hospital encounter of 08/01/16 (from the past 48 hour(s))  Glucose, capillary     Status: Abnormal   Collection Time: 08/02/16  5:19 PM  Result Value Ref Range   Glucose-Capillary 284 (H) 65 - 99 mg/dL   Comment 1 Notify RN   Glucose, capillary     Status: Abnormal   Collection Time: 08/02/16 10:16 PM  Result Value Ref Range   Glucose-Capillary 413 (H) 65 - 99 mg/dL   Comment 1 Notify RN   Glucose, capillary     Status: Abnormal   Collection Time: 08/02/16 11:20 PM  Result Value Ref Range   Glucose-Capillary 469 (H) 65 - 99 mg/dL   Comment 1 Notify RN   Glucose, capillary     Status: Abnormal   Collection Time: 08/03/16  5:57 AM  Result Value Ref Range   Glucose-Capillary 201 (H) 65 - 99 mg/dL   Comment 1 Notify RN    Comment 2 Document in Chart   Glucose, capillary     Status: Abnormal   Collection Time: 08/03/16 12:02 PM  Result Value Ref Range   Glucose-Capillary 370 (H) 65 - 99 mg/dL  Glucose, capillary     Status: Abnormal   Collection Time: 08/03/16  4:53 PM  Result Value Ref Range   Glucose-Capillary 201 (H) 65 - 99 mg/dL  Glucose, capillary     Status: Abnormal   Collection Time: 08/03/16  7:59 PM  Result Value Ref Range   Glucose-Capillary 342 (H) 65 - 99 mg/dL   Comment 1 Notify RN   Glucose, capillary     Status: Abnormal   Collection Time: 08/04/16  6:06 AM  Result Value Ref Range   Glucose-Capillary 38 (LL) 65 - 99 mg/dL   Comment 1 Notify RN   Glucose, capillary     Status: Abnormal   Collection Time: 08/04/16  6:32 AM  Result Value Ref Range   Glucose-Capillary 114 (H) 65 - 99 mg/dL  Glucose, capillary     Status: Abnormal   Collection Time: 08/04/16 11:38 AM  Result Value Ref Range   Glucose-Capillary 254 (H) 65 - 99 mg/dL    Blood Alcohol level:  Lab Results  Component Value Date   ETH 119 (H) 07/31/2016   ETH <5 09/20/2015    Metabolic Disorder Labs: Lab Results  Component Value Date   HGBA1C 8.5 (H) 09/23/2015   MPG 197 09/23/2015   No  results found for: PROLACTIN Lab Results  Component Value Date   CHOL 146 10/17/2015   TRIG 87 10/17/2015   HDL 68 10/17/2015   CHOLHDL 2.1 10/17/2015  VLDL 17 10/17/2015   LDLCALC 61 10/17/2015   LDLCALC 73 09/23/2015    Physical Findings: AIMS: Facial and Oral Movements Muscles of Facial Expression: None, normal Lips and Perioral Area: None, normal Jaw: None, normal Tongue: None, normal,Extremity Movements Upper (arms, wrists, hands, fingers): None, normal Lower (legs, knees, ankles, toes): None, normal, Trunk Movements Neck, shoulders, hips: None, normal, Overall Severity Severity of abnormal movements (highest score from questions above): None, normal Incapacitation due to abnormal movements: None, normal Patient's awareness of abnormal movements (rate only patient's report): No Awareness, Dental Status Current problems with teeth and/or dentures?: No Does patient usually wear dentures?: No  CIWA:  CIWA-Ar Total: 0 COWS:  COWS Total Score: 1  Musculoskeletal: Strength & Muscle Tone: within normal limits- not currently presenting with tremors, diaphoresis, or acute psychomotor restlessness  Gait & Station: normal Patient leans: N/A  Psychiatric Specialty Exam: Physical Exam  Vitals reviewed. Constitutional: He is oriented to person, place, and time. He appears well-developed.  HENT:  Head: Normocephalic.  Musculoskeletal: Normal range of motion.  Neurological: He is alert and oriented to person, place, and time.  Psychiatric: He has a normal mood and affect. His behavior is normal.    Review of Systems  Psychiatric/Behavioral: Positive for depression and substance abuse. The patient is nervous/anxious and has insomnia.    denies headache, denies visual disturbances, no vomiting .  Blood pressure 130/78, pulse 90, temperature 97.6 F (36.4 C), temperature source Oral, resp. rate 16, height 5' 9.5" (1.765 m), weight 78.9 kg (174 lb), SpO2 100 %.Body mass index is  25.33 kg/m.  General Appearance: Fairly Groomed  Eye Contact:  Good  Speech:  Normal Rate  Volume:  Normal  Mood:  Anxious and Depressed-mild  Affect:  Appropriate and mildly anxious   Thought Process:  Linear  Orientation:  Full (Time, Place, and Person)  Thought Content:  denies hallucinations, no delusions, not internally preoccupied   Suicidal Thoughts:  No- at this time denies suicidal ideations, denies self injurious ideations, contracts for safety on unit   Homicidal Thoughts:  No-  Memory:  recent and remote grossly intact   Judgement:  Other:  improving   Insight:  improving   Psychomotor Activity:  Normal   Concentration:  Concentration: Good and Attention Span: Good  Recall:  Good  Fund of Knowledge:  Good  Language:  Good  Akathisia:  Negative  Handed:  Right  AIMS (if indicated):     Assets:  Desire for Improvement Resilience  ADL's:  Intact  Cognition:  WNL  Sleep:  Number of Hours: 6.75     I agree with current treatment plan on 08/04/2016, Patient seen face-to-face for psychiatric evaluation follow-up, chart reviewed. Reviewed the information documented and agree with the treatment plan.  Treatment Plan Summary: Daily contact with patient to assess and evaluate symptoms and progress in treatment, Medication management, Plan inpatient treatment  and medication management as below  Continue to encourage group and milieu attendance to work on coping skills and symptom reduction  Continue to encourage efforts to work on relapse prevention and recovery efforts  Continue Neurontin  300 mgrs QHS for anxiety Continue Ativan detox protocol to minimize risk of alcohol WDL symptoms Continue Prozac 20 mgrs QDAY for depression  Continue Seroquel 25 mgrs QHS for insomnia, mood disorder  Continue DM management   Oneta Rackanika N Abrahan Fulmore, NP 08/04/2016, 4:58 PM

## 2016-08-04 NOTE — Progress Notes (Signed)
Patient ID: Micheal Lopez, male   DOB: 1986/11/28, 30 y.o.   MRN: [id number] D: Client is visible on the unit, seen on the phone and in dayroom. Client reports day been good. Client says this admission is different "I'm ready to quit" Client has plans to go to long term treatment. A: Writer provided emotional support, encouraged client to move forward in recovery. Medications reviewed, administered as ordered. Staff will monitor q4415mn for safety. R: Client is safe on the unit, attended group.

## 2016-08-05 LAB — GLUCOSE, CAPILLARY
GLUCOSE-CAPILLARY: 359 mg/dL — AB (ref 65–99)
Glucose-Capillary: 60 mg/dL — ABNORMAL LOW (ref 65–99)
Glucose-Capillary: 344 mg/dL — ABNORMAL HIGH (ref 65–99)
Glucose-Capillary: 339 mg/dL — ABNORMAL HIGH (ref 65–99)

## 2016-08-05 NOTE — [facility] Group Notes (Signed)
Patient attend group. His day was a 6. His goal to get place in a long term treatment.

## 2016-08-05 NOTE — Progress Notes (Signed)
Patient has complained of anxiety, but denies SI, HI and AVH this shift.  Patient had no complaints this shift and no incidents of behavioral dyscontrol.   Assess patient for safety, offer medications as prescribed engage patient in 1:1 Staff talks.   Continue to monitor for safety. 

## 2016-08-05 NOTE — [facility] Group Notes (Signed)
[facility] Group Notes:  (Clinical Social Work)  08/05/2016  10:00-11:00AM  Summary of Progress/Problems:   The main focus of today's process group was to   1)  discuss the importance of adding supports  2)  define health supports versus unhealthy supports  3)  Write a letter to someone asking for their support, defining what the patient feels is needed  An emphasis was placed on using counselor, doctor, therapy groups, 12-step groups, and problem-specific support groups to expand supports.    The patient expressed full comprehension of the concepts presented, and agreed that there is a need to add more supports.  The patient stated he has made a decision to seek treatment for his mental health issues in order to become more independent.  Type of Therapy:  Process Group with Motivational Interviewing  Participation Level:  Active  Participation Quality:  Attentive and Sharing  Affect:  Blunted  Cognitive:  Appropriate  Insight:  Engaged  Engagement in Therapy:  Engaged  Modes of Intervention:   Education, Teacher, English as a foreign languageupport and Processing, Activity  Ambrose MantleMareida Grossman-Orr, LCSW 08/05/2016

## 2016-08-05 NOTE — Progress Notes (Signed)
[facility] Progress Note  08/05/2016 2:13 PM Jarold SongDevon Rosenkranz  MRN:  [id number] Subjective:  Patient reports "I am having a good day today."  Objective :Jarold Songevon Schmelzle is awake, alert and oriented X4 , found attending group session.  Denies suicidal or homicidal ideation. Denies auditory or visual hallucination and does not appear to be responding to internal stimuli. Patient reports going to a residential treatment facility. Patient reports he is going to work hard this time. Patient reports he hard on his self when he relapse. Reports he motivated to go and stay sober.  Patient reports he is medication compliant without mediation side effects. Patient denies depression or depressive symptoms. Reports good appetite and states he is resting well. Support, encouragement and reassurance was provided.   Principal Problem: Major depressive disorder, recurrent severe without psychotic features (HCC) Diagnosis:   Patient Active Problem List   Diagnosis Date Noted  . Major depressive disorder, recurrent severe without psychotic features (HCC) [F33.2] 07/31/2016  . Alcohol intoxication (HCC) [F10.129]   . Alcohol use disorder, severe, dependence (HCC) [F10.20] 09/21/2015  . Suicidal ideation [R45.851]    Total Time spent with patient: 20 minutes    Past Medical History:  Past Medical History:  Diagnosis Date  . Depression    per pt.  . Diabetes mellitus without complication (HCC)    Type I   History reviewed. No pertinent surgical history. Family History:  Family History  Problem Relation Age of Onset  . Cancer Maternal Grandmother     BREAST     Social History:  History  Alcohol Use  . Yes    Comment: occasional      History  Drug Use  . Frequency: 7.0 times per week  . Types: Marijuana    Social History   Social History  . Marital status: Legally Separated    Spouse name: N/A  . Number of children: N/A  . Years of education: N/A   Social History Main Topics  . Smoking status:  Current Every Day Smoker    Types: E-cigarettes  . Smokeless tobacco: Never Used  . Alcohol use Yes     Comment: occasional   . Drug use:     Frequency: 7.0 times per week    Types: Marijuana  . Sexual activity: No   Other Topics Concern  . None   Social History Narrative  . None   Additional Social History:   Sleep: Good  Appetite:  Good  Current Medications: Current Facility-Administered Medications  Medication Dose Route Frequency Provider Last Rate Last Dose  . FLUoxetine (PROZAC) capsule 20 mg  20 mg Oral Daily Charm RingsJamison Y Lord, NP   20 mg at 08/05/16 0731  . folic acid (FOLVITE) tablet 1 mg  1 mg Oral Daily Charm RingsJamison Y Lord, NP   1 mg at 08/05/16 0731  . gabapentin (NEURONTIN) capsule 300 mg  300 mg Oral QHS Adonis BrookSheila Agustin, NP   300 mg at 08/04/16 2113  . ibuprofen (ADVIL,MOTRIN) tablet 600 mg  600 mg Oral Q8H PRN Charm RingsJamison Y Lord, NP      . insulin aspart (novoLOG) injection 0-15 Units  0-15 Units Subcutaneous TID WC Charm RingsJamison Y Lord, NP   11 Units at 08/05/16 1157  . insulin aspart (novoLOG) injection 4 Units  4 Units Subcutaneous TID WC Charm RingsJamison Y Lord, NP   4 Units at 08/05/16 1156  . insulin glargine (LANTUS) injection 35 Units  35 Units Subcutaneous QHS Charm RingsJamison Y Lord, NP   35 Units  at 08/04/16 2152  . magnesium hydroxide (MILK OF MAGNESIA) suspension 30 mL  30 mL Oral Daily PRN Charm Rings, NP      . multivitamin with minerals tablet 1 tablet  1 tablet Oral Daily Craige Cotta, MD   1 tablet at 08/05/16 0731  . nicotine polacrilex (NICORETTE) gum 2 mg  2 mg Oral PRN Craige Cotta, MD   2 mg at 08/04/16 1825  . QUEtiapine (SEROQUEL) tablet 25 mg  25 mg Oral QHS,MR X 1 Kerry Hough, PA-C   25 mg at 08/04/16 2113  . thiamine (B-1) injection 100 mg  100 mg Intramuscular Once Craige Cotta, MD      . thiamine (VITAMIN B-1) tablet 100 mg  100 mg Oral Daily Craige Cotta, MD   100 mg at 08/05/16 0731    Lab Results:  Results for orders placed or performed  during the hospital encounter of 08/01/16 (from the past 48 hour(s))  Glucose, capillary     Status: Abnormal   Collection Time: 08/03/16  4:53 PM  Result Value Ref Range   Glucose-Capillary 201 (H) 65 - 99 mg/dL  Glucose, capillary     Status: Abnormal   Collection Time: 08/03/16  7:59 PM  Result Value Ref Range   Glucose-Capillary 342 (H) 65 - 99 mg/dL   Comment 1 Notify RN   Glucose, capillary     Status: Abnormal   Collection Time: 08/04/16  6:06 AM  Result Value Ref Range   Glucose-Capillary 38 (LL) 65 - 99 mg/dL   Comment 1 Notify RN   Glucose, capillary     Status: Abnormal   Collection Time: 08/04/16  6:32 AM  Result Value Ref Range   Glucose-Capillary 114 (H) 65 - 99 mg/dL  Glucose, capillary     Status: Abnormal   Collection Time: 08/04/16 11:38 AM  Result Value Ref Range   Glucose-Capillary 254 (H) 65 - 99 mg/dL  Glucose, capillary     Status: Abnormal   Collection Time: 08/04/16  4:47 PM  Result Value Ref Range   Glucose-Capillary 283 (H) 65 - 99 mg/dL   Comment 1 Notify RN   Glucose, capillary     Status: Abnormal   Collection Time: 08/04/16  9:47 PM  Result Value Ref Range   Glucose-Capillary 217 (H) 65 - 99 mg/dL  Glucose, capillary     Status: Abnormal   Collection Time: 08/05/16  5:35 AM  Result Value Ref Range   Glucose-Capillary 60 (L) 65 - 99 mg/dL  Glucose, capillary     Status: Abnormal   Collection Time: 08/05/16 11:12 AM  Result Value Ref Range   Glucose-Capillary 344 (H) 65 - 99 mg/dL   Comment 1 Notify RN    Comment 2 Document in Chart     Blood Alcohol level:  Lab Results  Component Value Date   ETH 119 (H) 07/31/2016   ETH <5 09/20/2015    Metabolic Disorder Labs: Lab Results  Component Value Date   HGBA1C 8.5 (H) 09/23/2015   MPG 197 09/23/2015   No results found for: PROLACTIN Lab Results  Component Value Date   CHOL 146 10/17/2015   TRIG 87 10/17/2015   HDL 68 10/17/2015   CHOLHDL 2.1 10/17/2015   VLDL 17 10/17/2015    LDLCALC 61 10/17/2015   LDLCALC 73 09/23/2015    Physical Findings: AIMS: Facial and Oral Movements Muscles of Facial Expression: None, normal Lips and Perioral Area: None, normal Jaw:  None, normal Tongue: None, normal,Extremity Movements Upper (arms, wrists, hands, fingers): None, normal Lower (legs, knees, ankles, toes): None, normal, Trunk Movements Neck, shoulders, hips: None, normal, Overall Severity Severity of abnormal movements (highest score from questions above): None, normal Incapacitation due to abnormal movements: None, normal Patient's awareness of abnormal movements (rate only patient's report): No Awareness, Dental Status Current problems with teeth and/or dentures?: No Does patient usually wear dentures?: No  CIWA:  CIWA-Ar Total: 0 COWS:  COWS Total Score: 1  Musculoskeletal: Strength & Muscle Tone: within normal limits- not currently presenting with tremors, diaphoresis, or acute psychomotor restlessness  Gait & Station: normal Patient leans: N/A  Psychiatric Specialty Exam: Physical Exam  Nursing note and vitals reviewed. Constitutional: He is oriented to person, place, and time. He appears well-developed.  HENT:  Head: Normocephalic.  Cardiovascular: Normal rate.   Musculoskeletal: Normal range of motion.  Neurological: He is alert and oriented to person, place, and time.  Psychiatric: He has a normal mood and affect. His behavior is normal.    Review of Systems  Psychiatric/Behavioral: Positive for depression and substance abuse. The patient is nervous/anxious and has insomnia.    denies headache, denies visual disturbances, no vomiting .  Blood pressure 121/83, pulse 71, temperature 97.7 F (36.5 C), temperature source Oral, resp. rate 18, height 5' 9.5" (1.765 m), weight 78.9 kg (174 lb), SpO2 100 %.Body mass index is 25.33 kg/m.  General Appearance: Fairly Groomed  Eye Contact:  Good  Speech:  Normal Rate  Volume:  Normal  Mood:  Anxious and  Depressed-mild  Affect:  Appropriate and mildly anxious   Thought Process:  Linear  Orientation:  Full (Time, Place, and Person)  Thought Content:  denies hallucinations, no delusions, not internally preoccupied   Suicidal Thoughts:  No- at this time denies suicidal ideations, denies self injurious ideations, contracts for safety on unit   Homicidal Thoughts:  No-  Memory:  recent and remote grossly intact   Judgement:  Other:  improving   Insight:  improving   Psychomotor Activity:  Normal   Concentration:  Concentration: Good and Attention Span: Good  Recall:  Good  Fund of Knowledge:  Good  Language:  Good  Akathisia:  Negative  Handed:  Right  AIMS (if indicated):     Assets:  Desire for Improvement Resilience  ADL's:  Intact  Cognition:  WNL  Sleep:  Number of Hours: 6.75     I agree with current treatment plan on 08/05/2016, Patient seen face-to-face for psychiatric evaluation follow-up, chart reviewed. Reviewed the information documented and agree with the treatment plan.  Treatment Plan Summary: Daily contact with patient to assess and evaluate symptoms and progress in treatment, Medication management, Plan inpatient treatment  and medication management as below    Continue to encourage group and milieu attendance to work on coping skills and symptom reduction  Continue to encourage efforts to work on relapse prevention and recovery efforts  Continue Neurontin  300 mgrs QHS for anxiety Continue Ativan detox protocol to minimize risk of alcohol WDL symptoms Continue Prozac 20 mgrs QDAY for depression  Continue Seroquel 25 mgrs QHS for insomnia, mood disorder  Continue DM management   Oneta Rack, NP 08/05/2016, 2:13 PM

## 2016-08-06 LAB — GLUCOSE, CAPILLARY
GLUCOSE-CAPILLARY: 351 mg/dL — AB (ref 65–99)
Glucose-Capillary: 121 mg/dL — ABNORMAL HIGH (ref 65–99)
Glucose-Capillary: 296 mg/dL — ABNORMAL HIGH (ref 65–99)
Glucose-Capillary: 285 mg/dL — ABNORMAL HIGH (ref 65–99)

## 2016-08-06 MED ORDER — INSULIN GLARGINE 100 UNIT/ML ~~LOC~~ SOLN
28.0000 [IU] | Freq: Every day | SUBCUTANEOUS | Status: DC
  Administered 2016-08-06 – 2016-08-08 (×3): 28 [IU] via SUBCUTANEOUS

## 2016-08-06 MED ORDER — GABAPENTIN 100 MG PO CAPS
100.0000 mg | ORAL_CAPSULE | Freq: Two times a day (BID) | ORAL | Status: DC
  Administered 2016-08-06 – 2016-08-07 (×2): 100 mg via ORAL
  Filled 2016-08-06 (×4): qty 1

## 2016-08-06 MED ORDER — INSULIN ASPART 100 UNIT/ML ~~LOC~~ SOLN
6.0000 [IU] | Freq: Three times a day (TID) | SUBCUTANEOUS | Status: DC
  Administered 2016-08-06 – 2016-08-09 (×9): 6 [IU] via SUBCUTANEOUS

## 2016-08-06 MED ORDER — INSULIN ASPART 100 UNIT/ML ~~LOC~~ SOLN: 3.0000 [IU] | Freq: Three times a day (TID) | SUBCUTANEOUS | Status: DC

## 2016-08-06 NOTE — Progress Notes (Signed)
Patient ID: Micheal Lopez, male   DOB: 1986/07/30, 30 y.o.   MRN: [id number] [facility] MD Progress Note  08/06/2016 2:02 PM Micheal Lopez  MRN:  [id number] Subjective:   Patient seen, chart reviewed and case discussed with nursing staff.   Patient reports insomnia last night. He continues to feel anxious; for example, he would feel anxious when his friend did not answer the phone. He agrees that he has catastropic thought. He craves for nicotine but denies craving for alcohol. He denies SI. He is hoping to go to residential treatment. He denies AH/VH. He denies drowsiness from taking gabapentin.   Principal Problem: Major depressive disorder, recurrent severe without psychotic features (HCC) Diagnosis:   Patient Active Problem List   Diagnosis Date Noted  . Major depressive disorder, recurrent severe without psychotic features (HCC) [F33.2] 07/31/2016  . Alcohol intoxication (HCC) [F10.129]   . Alcohol use disorder, severe, dependence (HCC) [F10.20] 09/21/2015  . Suicidal ideation [R45.851]    Total Time spent with patient: 20 minutes    Past Medical History:  Past Medical History:  Diagnosis Date  . Depression    per pt.  . Diabetes mellitus without complication (HCC)    Type I   History reviewed. No pertinent surgical history. Family History:  Family History  Problem Relation Age of Onset  . Cancer Maternal Grandmother     BREAST     Social History:  History  Alcohol Use  . Yes    Comment: occasional      History  Drug Use  . Frequency: 7.0 times per week  . Types: Marijuana    Social History   Social History  . Marital status: Legally Separated    Spouse name: N/A  . Number of children: N/A  . Years of education: N/A   Social History Main Topics  . Smoking status: Current Every Day Smoker    Types: E-cigarettes  . Smokeless tobacco: Never Used  . Alcohol use Yes     Comment: occasional   . Drug use:     Frequency: 7.0 times per week    Types: Marijuana  .  Sexual activity: No   Other Topics Concern  . None   Social History Narrative  . None   Additional Social History:   Sleep: Good  Appetite:  Good  Current Medications: Current Facility-Administered Medications  Medication Dose Route Frequency Provider Last Rate Last Dose  . FLUoxetine (PROZAC) capsule 20 mg  20 mg Oral Daily Charm Rings, NP   20 mg at 08/06/16 0820  . folic acid (FOLVITE) tablet 1 mg  1 mg Oral Daily Charm Rings, NP   1 mg at 08/06/16 0820  . gabapentin (NEURONTIN) capsule 100 mg  100 mg Oral BID Neysa Hotter, MD      . gabapentin (NEURONTIN) capsule 300 mg  300 mg Oral QHS Adonis Brook, NP   300 mg at 08/05/16 2145  . ibuprofen (ADVIL,MOTRIN) tablet 600 mg  600 mg Oral Q8H PRN Charm Rings, NP      . insulin aspart (novoLOG) injection 0-15 Units  0-15 Units Subcutaneous TID WC Charm Rings, NP   15 Units at 08/06/16 1203  . insulin aspart (novoLOG) injection 6 Units  6 Units Subcutaneous TID WC Adonis Brook, NP      . insulin glargine (LANTUS) injection 28 Units  28 Units Subcutaneous QHS Adonis Brook, NP      . magnesium hydroxide (MILK OF MAGNESIA) suspension 30 mL  30 mL Oral Daily PRN Charm RingsJamison Y Lord, NP      . multivitamin with minerals tablet 1 tablet  1 tablet Oral Daily Craige CottaFernando A Cobos, MD   1 tablet at 08/06/16 0820  . nicotine polacrilex (NICORETTE) gum 2 mg  2 mg Oral PRN Craige CottaFernando A Cobos, MD   2 mg at 08/06/16 0820  . QUEtiapine (SEROQUEL) tablet 25 mg  25 mg Oral QHS,MR X 1 Kerry HoughSpencer E Simon, PA-C   25 mg at 08/05/16 2145  . thiamine (B-1) injection 100 mg  100 mg Intramuscular Once Craige CottaFernando A Cobos, MD      . thiamine (VITAMIN B-1) tablet 100 mg  100 mg Oral Daily Craige CottaFernando A Cobos, MD   100 mg at 08/06/16 0820    Lab Results:  Results for orders placed or performed during the hospital encounter of 08/01/16 (from the past 48 hour(s))  Glucose, capillary     Status: Abnormal   Collection Time: 08/04/16  4:47 PM  Result Value Ref Range    Glucose-Capillary 283 (H) 65 - 99 mg/dL   Comment 1 Notify RN   Glucose, capillary     Status: Abnormal   Collection Time: 08/04/16  9:47 PM  Result Value Ref Range   Glucose-Capillary 217 (H) 65 - 99 mg/dL  Glucose, capillary     Status: Abnormal   Collection Time: 08/05/16  5:35 AM  Result Value Ref Range   Glucose-Capillary 60 (L) 65 - 99 mg/dL  Glucose, capillary     Status: Abnormal   Collection Time: 08/05/16 11:12 AM  Result Value Ref Range   Glucose-Capillary 344 (H) 65 - 99 mg/dL   Comment 1 Notify RN    Comment 2 Document in Chart   Glucose, capillary     Status: Abnormal   Collection Time: 08/05/16  4:44 PM  Result Value Ref Range   Glucose-Capillary 359 (H) 65 - 99 mg/dL   Comment 1 Notify RN    Comment 2 Document in Chart   Glucose, capillary     Status: Abnormal   Collection Time: 08/05/16  9:28 PM  Result Value Ref Range   Glucose-Capillary 339 (H) 65 - 99 mg/dL  Glucose, capillary     Status: Abnormal   Collection Time: 08/06/16  5:58 AM  Result Value Ref Range   Glucose-Capillary 121 (H) 65 - 99 mg/dL  Glucose, capillary     Status: Abnormal   Collection Time: 08/06/16 11:58 AM  Result Value Ref Range   Glucose-Capillary 351 (H) 65 - 99 mg/dL   Comment 1 Notify RN    Comment 2 Document in Chart     Blood Alcohol level:  Lab Results  Component Value Date   ETH 119 (H) 07/31/2016   ETH <5 09/20/2015    Metabolic Disorder Labs: Lab Results  Component Value Date   HGBA1C 8.5 (H) 09/23/2015   MPG 197 09/23/2015   No results found for: PROLACTIN Lab Results  Component Value Date   CHOL 146 10/17/2015   TRIG 87 10/17/2015   HDL 68 10/17/2015   CHOLHDL 2.1 10/17/2015   VLDL 17 10/17/2015   LDLCALC 61 10/17/2015   LDLCALC 73 09/23/2015    Physical Findings: AIMS: Facial and Oral Movements Muscles of Facial Expression: None, normal Lips and Perioral Area: None, normal Jaw: None, normal Tongue: None, normal,Extremity Movements Upper (arms,  wrists, hands, fingers): None, normal Lower (legs, knees, ankles, toes): None, normal, Trunk Movements Neck, shoulders, hips: None, normal, Overall Severity  Severity of abnormal movements (highest score from questions above): None, normal Incapacitation due to abnormal movements: None, normal Patient's awareness of abnormal movements (rate only patient's report): No Awareness, Dental Status Current problems with teeth and/or dentures?: No Does patient usually wear dentures?: No  CIWA:  CIWA-Ar Total: 0 COWS:  COWS Total Score: 1  Musculoskeletal: Strength & Muscle Tone: within normal limits- not currently presenting with tremors, diaphoresis, or acute psychomotor restlessness  Gait & Station: normal Patient leans: N/A  Psychiatric Specialty Exam: Physical Exam  Nursing note and vitals reviewed. Constitutional: He is oriented to person, place, and time. He appears well-developed and well-nourished.  HENT:  Head: Normocephalic.  Cardiovascular: Normal rate.   Musculoskeletal: Normal range of motion.  Neurological: He is alert and oriented to person, place, and time.  No tremors   Psychiatric: His behavior is normal.    Review of Systems  Neurological: Negative for tremors.  Psychiatric/Behavioral: Positive for substance abuse. Negative for depression. The patient is nervous/anxious and has insomnia.   All other systems reviewed and are negative.  denies headache, denies visual disturbances, no vomiting .  Blood pressure 115/69, pulse 68, temperature 98.3 F (36.8 C), temperature source Oral, resp. rate 18, height 5' 9.5" (1.765 m), weight 174 lb (78.9 kg), SpO2 100 %.Body mass index is 25.33 kg/m.  General Appearance: Fairly Groomed  Eye Contact:  Good  Speech:  Normal Rate  Volume:  Normal  Mood:  Anxious-mild  Affect:  Appropriate and mildly anxious   Thought Process:  Linear  Orientation:  Full (Time, Place, and Person)  Thought Content:  denies hallucinations, no  delusions, not internally preoccupied   Suicidal Thoughts:  No- at this time denies suicidal ideations, denies self injurious ideations, contracts for safety on unit   Homicidal Thoughts:  No-  Memory:  recent and remote grossly intact   Judgement:  Other:  improving   Insight:  improving   Psychomotor Activity:  Normal   Concentration:  Concentration: Good and Attention Span: Good  Recall:  Good  Fund of Knowledge:  Good  Language:  Good  Akathisia:  Negative  Handed:  Right  AIMS (if indicated):     Assets:  Desire for Improvement Resilience  ADL's:  Intact  Cognition:  WNL  Sleep:  Number of Hours: 6.75   Assessment 30 year old man with alcohol use disorder, marijuana use disorder, type I DM  presented with SI of hanging himself in the context of relapsing alcohol and marijuana use, breaking up with his girlfriend.   # Alcohol use disorder # Marijuana use disorder He is motivated for sobriety. Will increase gabapentin for alcohol craving. Will plan to refer to residential program. No significant withdrawal symptoms reported on today's evaluation.   # MDD # Unspecified anxiety disorder # r/o substance induced mood disorder Continue fluoxetine. Will uptitrate gabapentin as above; hopefully it works for his anxiety as well. Discussed cognitive distortion of catastrophic thinking and cognitive restructuring. He would greatly benefit from CBT treatment when he is discharged from residential treatment program.   Plans Increase Neurontin 100 mg -100 mg- 300 mg Completed ciwa protocol Continue Prozac 20 mgrs QDAY for depression  Continue Seroquel 25 mgrs QHS for insomnia, mood disorder  Continue DM management  Continue to encourage group and milieu attendance to work on coping skills and symptom reduction  Continue to encourage efforts to work on relapse prevention and recovery efforts   Treatment Plan Summary: Daily contact with patient to assess and  evaluate symptoms and  progress in treatment, Medication management, Plan inpatient treatment  and medication management as below      Neysa Hotter, MD 08/06/2016, 2:02 PM

## 2016-08-06 NOTE — Progress Notes (Signed)
D Pt. Denies SI and HI, no complaints of pain or discomfort noted at present time.  A Writer offered support and encouragement, discussed pt.'s day as well as coping skills.  R Pt. Rated his day a 6, his depression and anxiety a 2.  Reports his coping skills include listening to and playing music.  Pt. Is [facility] that [facility] be [facility] and he will relapse if that happens, stating his Mother is also concerned.

## 2016-08-06 NOTE — Progress Notes (Signed)
Recreation Therapy Notes  Date: 08/06/16 Time: 0930 Location: 300 Group Room  Group Topic: Stress Management  Goal Area(s) Addresses:  Patient will verbalize importance of using healthy stress management.  Patient will identify positive emotions associated with healthy stress management.   Behavioral Response: Engaged  Intervention: Stress Management  Activity :  [place] Script.  LRT will introduce the technique of guided imagery to patients.  Patients will follow along with LRT as script is read to engage in activity.  Education:  Stress Management, Discharge Planning.   Education Outcome: Acknowledges edcuation/In group clarification offered/Needs additional education  Clinical Observations/Feedback: Pt was engaged in group.    Delcenia Inman, LRT/CTRS  

## 2016-08-06 NOTE — Progress Notes (Signed)
  D: Pt presents anxious on approach. Pt rates anxiety 6/10. Depression 2/10. Hopelessness 2/10. Pt denies suicidal thoughts. Pt denies withdrawal symptoms. [facility]. Pt stated that he's afraid that he will relapse if he's not discharged to a treatment facility.  A: Medications reviewed with pt. Medications administered as ordered per MD. Verbal support provided. Writer informed Gaspar ColaSheila May, NP., of diabetes recommendation. 15 minute checks performed for safety.  R: Pt receptive to tx.

## 2016-08-06 NOTE — Progress Notes (Signed)
Patient ID: Micheal Lopez, male   DOB: [birth year], 30 y.o.   MRN: [id number]  Pt currently presents with a blunted affect and cooperative behavior. Pt reports to writer that their goal is to "not go home yet." Pt states ""I want to go straight to a recovery program, I also don't necessarily want to be here for three weeks though either. Pt reports slight craving for nicotine but denies any other craving. Pt reports good sleep with current medication regimen.   Pt provided with scheduled and as needed medications per providers orders. Pt's labs and vitals were monitored throughout the night. Pt supported emotionally and encouraged to express concerns and questions. Pt educated on medications.   Pt's safety ensured with 15 minute and environmental checks. Pt currently denies SI/HI and A/V hallucinations. Pt verbally agrees to seek staff if SI/HI or A/VH occurs and to consult with staff before acting on any harmful thoughts. Will continue POC.

## 2016-08-06 NOTE — [facility] Group Notes (Signed)
[facility] LCSW Group Therapy  08/06/2016   1:15 PM   Type of Therapy:  Group Therapy  Participation Level:  Active  Participation Quality:  Attentive  Affect: Appropriate  Cognitive:  Alert and Oriented  Insight:  Developing/Improving and Engaged  Engagement in Therapy:  Developing/Improving and Engaged  Modes of Intervention:  Clarification, Confrontation, Discussion, Education, Exploration, Limit-setting, Orientation, Problem-solving, Rapport Building, Dance movement psychotherapisteality Testing, Socialization and Support  Summary of Progress/Problems: The topic for group therapy was feelings about diagnosis.  Pt actively participated in group discussion on their past and current diagnosis and how they feel towards this.  Pt also identified how society and family members judge them, based on their diagnosis as well as stereotypes and stigmas.  Patient participated minimally in discussion but did engage in therapeutic letter writing activity focused on his addiction as well as his future.   Samuella BruinKristin Sausha Raymond, MSW, LCSW Clinical Social Worker [facility] [phone]

## 2016-08-06 NOTE — [facility] Group Notes (Signed)
Patient attend group. His day was a 8. He dd not meet his goal. He wants to be place long term facility. This did not happen today.

## 2016-08-06 NOTE — Progress Notes (Signed)
Inpatient Diabetes Program Recommendations  AACE/ADA: New Consensus Statement on Inpatient Glycemic Control (2015)  Target Ranges:  Prepandial:   less than 140 mg/dL      Peak postprandial:   less than 180 mg/dL (1-2 hours)      Critically ill patients:  140 - 180 mg/dL   Results for Micheal Lopez, Micheal Lopez (MRN [id number]) as of 08/06/2016 10:03  Ref. Range 08/04/2016 06:06 08/04/2016 06:32 08/04/2016 11:38 08/04/2016 16:47 08/04/2016 21:47  Glucose-Capillary Latest Ref Range: 65 - 99 mg/dL 38 (LL) 409114 (H) 811254 (H) 283 (H) 217 (H)   Results for Micheal Lopez, Micheal Lopez (MRN [id number]) as of 08/06/2016 10:03  Ref. Range 08/05/2016 05:35 08/05/2016 11:12 08/05/2016 16:44 08/05/2016 21:28  Glucose-Capillary Latest Ref Range: 65 - 99 mg/dL 60 (L) 213344 (H) 086359 (H) 339 (H)    Home DM Meds: Lantus 35 units QHS                             Novolog SSI  Current Insulin Orders: Lantus 35 units QHS                                       Novolog Moderate Correction Scale/ SSI (0-15 units) TID AC                                       Novolog 4 units tidwc     MD- Note patient with Hypoglycemia in the morning on 08/12 and 08/13 followed by elevated postprandial glucose levels.  Please consider the following in-hospital insulin adjustments based on review of glucose trends and insulin given:  1. Reduce Lantus to 28 units QHS (20% reduction of dose)  2. Increase Novolog Meal Coverage to: Novolog 6 units tidwc     --Will follow patient during hospitalization--  Ambrose FinlandJeannine Johnston Talyah Seder RN, MSN, CDE Diabetes Coordinator Inpatient Glycemic Control Team Team Pager: [phone] (8a-5p)

## 2016-08-07 LAB — GLUCOSE, CAPILLARY
GLUCOSE-CAPILLARY: 274 mg/dL — AB (ref 65–99)
Glucose-Capillary: 278 mg/dL — ABNORMAL HIGH (ref 65–99)
Glucose-Capillary: 321 mg/dL — ABNORMAL HIGH (ref 65–99)
Glucose-Capillary: 324 mg/dL — ABNORMAL HIGH (ref 65–99)

## 2016-08-07 MED ORDER — HYDROXYZINE HCL 25 MG PO TABS
25.0000 mg | ORAL_TABLET | Freq: Three times a day (TID) | ORAL | Status: DC | PRN
  Administered 2016-08-08: 25 mg via ORAL
  Filled 2016-08-07: qty 1
  Filled 2016-08-07: qty 10

## 2016-08-07 MED ORDER — GABAPENTIN 100 MG PO CAPS
200.0000 mg | ORAL_CAPSULE | Freq: Two times a day (BID) | ORAL | Status: DC
  Administered 2016-08-07 – 2016-08-09 (×4): 200 mg via ORAL
  Filled 2016-08-07 (×3): qty 2
  Filled 2016-08-07: qty 14
  Filled 2016-08-07 (×2): qty 2
  Filled 2016-08-07: qty 14
  Filled 2016-08-07 (×3): qty 2

## 2016-08-07 MED ORDER — DOXEPIN HCL 10 MG PO CAPS
10.0000 mg | ORAL_CAPSULE | Freq: Every day | ORAL | Status: DC
  Administered 2016-08-07 – 2016-08-08 (×2): 10 mg via ORAL
  Filled 2016-08-07 (×5): qty 1

## 2016-08-07 NOTE — Progress Notes (Signed)
Nursing Note 08/07/2016 [id number]  Data Reports sleeping poorly with PRN sleep med.  Rates depression 2/10, hopelessness 1/10, and anxiety 6/10. Affect animated and anxious mood "anxious."  Denies HI, SI, AVH.  Patient active and present in day area most of day .  Interacting with peers.  Attending group, appropriate.  Patient anxious about discharge "I don't want to get discharged home, I want to have something lined up before I leave."   Action Spoke with patient 1:1, nurse offered support to patient throughout shift.  Encouraged to discuss insomnia with provider and address aftercare concerns with social work.  Continues to be monitored on 15 minute checks for safety.  Response Patient remains safe and appropriate on unit.

## 2016-08-07 NOTE — Progress Notes (Signed)
Recreation Therapy Notes   Animal-Assisted Activity (AAA) Program Checklist/Progress Notes Patient Eligibility Criteria Checklist & Daily Group note for Rec TxIntervention  Date: 08.15.2017 Time: 2:45pm [address]   AAA/T Program Assumption of Risk Form signed by Patient/ or Parent Legal Guardian Yes  Patient is free of allergies or sever asthma Yes  Patient reports no fear of animals Yes  Patient reports no history of cruelty to animals Yes  Patient understands his/her participation is voluntary Yes  Patient washes hands before animal contact Yes  Patient washes hands after animal contact Yes  Behavioral Response: Engaged, Attentive, Appropriate   Education:Hand Washing, Appropriate Animal Interaction   Education Outcome: Acknowledges education.   Clinical Observations/Feedback: Patient attended session and interacted appropriately with therapy dog and peers. Patient asked appropriate questions about therapy dog and his training.    Marykay Lexenise L Alois Mincer, LRT/CTRS  Jacquline Terrill L 08/07/2016 3:10 PM

## 2016-08-07 NOTE — [facility] Group Notes (Signed)
Nursing Psychoeducational Group 08/07/2016 [id number]  Today we discussed how to make a SMART goal- a goal that is specific, measurable, attainable, relevant, and time specific.  We then discussed 3 categories of needs including needs of the mind, body, and spirit.  Finally there was education about sleep hygiene. Patient attended the entire session, was engaged, shared, and asked a lot of questions.  He had a bright affect, and was supportive of peers.

## 2016-08-07 NOTE — Progress Notes (Signed)
Patient ID: Micheal SongDevon Miron, male   DOB: 12-03-1986, 30 y.o.   [id number] [facility]  08/07/2016 11:58 AM Micheal Lopez  MRN:  [id number] Subjective:   Patient seen, chart reviewed and case discussed with nursing staff.   He states that he feels "great" today. He continues to have insomnia and finds little benefit from quetiapine. He feels anxious at baseline and would like to have some medication. He reports chronic rash on his trunk and asks for treatment.    Principal Problem: Major depressive disorder, recurrent severe without psychotic features (HCC) Diagnosis:   Patient Active Problem List   Diagnosis Date Noted  . Major depressive disorder, recurrent severe without psychotic features (HCC) [F33.2] 07/31/2016  . Alcohol intoxication (HCC) [F10.129]   . Alcohol use disorder, severe, dependence (HCC) [F10.20] 09/21/2015  . Suicidal ideation [R45.851]    Total Time spent with patient: 20 minutes    Past Medical History:  Past Medical History:  Diagnosis Date  . Depression    per pt.  . Diabetes mellitus without complication (HCC)    Type I   History reviewed. No pertinent surgical history. Family History:  Family History  Problem Relation Age of Onset  . Cancer Maternal Grandmother     BREAST     Social History:  History  Alcohol Use  . Yes    Comment: occasional      History  Drug Use  . Frequency: 7.0 times per week  . Types: Marijuana    Social History   Social History  . Marital status: Legally Separated    Spouse name: N/A  . Number of children: N/A  . Years of education: N/A   Social History Main Topics  . Smoking status: Current Every Day Smoker    Types: E-cigarettes  . Smokeless tobacco: Never Used  . Alcohol use Yes     Comment: occasional   . Drug use:     Frequency: 7.0 times per week    Types: Marijuana  . Sexual activity: No   Other Topics Concern  . None   Social History Narrative  . None   Additional Social History:    Sleep: Good  Appetite:  Good  Current Medications: Current Facility-Administered Medications  Medication Dose Route Frequency Provider Last Rate Last Dose  . FLUoxetine (PROZAC) capsule 20 mg  20 mg Oral Daily Charm RingsJamison Y Lord, NP   20 mg at 08/07/16 0738  . folic acid (FOLVITE) tablet 1 mg  1 mg Oral Daily Charm RingsJamison Y Lord, NP   1 mg at 08/07/16 [id number]  . gabapentin (NEURONTIN) capsule 200 mg  200 mg Oral BID Neysa Hottereina Kenadi Miltner, MD      . gabapentin (NEURONTIN) capsule 300 mg  300 mg Oral QHS Adonis BrookSheila Agustin, NP   300 mg at 08/06/16 2212  . hydrOXYzine (ATARAX/VISTARIL) tablet 25 mg  25 mg Oral TID PRN Neysa Hottereina Nakiea Metzner, MD      . ibuprofen (ADVIL,MOTRIN) tablet 600 mg  600 mg Oral Q8H PRN Charm RingsJamison Y Lord, NP      . insulin aspart (novoLOG) injection 0-15 Units  0-15 Units Subcutaneous TID WC Charm RingsJamison Y Lord, NP   8 Units at 08/07/16 [phone]  . insulin aspart (novoLOG) injection 6 Units  6 Units Subcutaneous TID WC Adonis BrookSheila Agustin, NP   6 Units at 08/07/16 [phone]  . insulin glargine (LANTUS) injection 28 Units  28 Units Subcutaneous QHS Adonis BrookSheila Agustin, NP   28 Units at 08/06/16 2213  . magnesium  hydroxide (MILK OF MAGNESIA) suspension 30 mL  30 mL Oral Daily PRN Charm RingsJamison Y Lord, NP      . multivitamin with minerals tablet 1 tablet  1 tablet Oral Daily Craige CottaFernando A Cobos, MD   1 tablet at 08/07/16 [phone]  . nicotine polacrilex (NICORETTE) gum 2 mg  2 mg Oral PRN Craige CottaFernando A Cobos, MD   2 mg at 08/07/16 0957  . thiamine (B-1) injection 100 mg  100 mg Intramuscular Once Craige CottaFernando A Cobos, MD      . thiamine (VITAMIN B-1) tablet 100 mg  100 mg Oral Daily Craige CottaFernando A Cobos, MD   100 mg at 08/07/16 [phone]    Lab Results:  Results for orders placed or performed during the hospital encounter of 08/01/16 (from the past 48 hour(s))  Glucose, capillary     Status: Abnormal   Collection Time: 08/05/16  4:44 PM  Result Value Ref Range   Glucose-Capillary 359 (H) 65 - 99 mg/dL   Comment 1 Notify RN    Comment 2 Document in Chart    Glucose, capillary     Status: Abnormal   Collection Time: 08/05/16  9:28 PM  Result Value Ref Range   Glucose-Capillary 339 (H) 65 - 99 mg/dL  Glucose, capillary     Status: Abnormal   Collection Time: 08/06/16  5:58 AM  Result Value Ref Range   Glucose-Capillary 121 (H) 65 - 99 mg/dL  Glucose, capillary     Status: Abnormal   Collection Time: 08/06/16 11:58 AM  Result Value Ref Range   Glucose-Capillary 351 (H) 65 - 99 mg/dL   Comment 1 Notify RN    Comment 2 Document in Chart   Glucose, capillary     Status: Abnormal   Collection Time: 08/06/16  5:06 PM  Result Value Ref Range   Glucose-Capillary 296 (H) 65 - 99 mg/dL  Glucose, capillary     Status: Abnormal   Collection Time: 08/06/16  9:17 PM  Result Value Ref Range   Glucose-Capillary 285 (H) 65 - 99 mg/dL  Glucose, capillary     Status: Abnormal   Collection Time: 08/07/16  6:30 AM  Result Value Ref Range   Glucose-Capillary 274 (H) 65 - 99 mg/dL    Blood Alcohol level:  Lab Results  Component Value Date   ETH 119 (H) 07/31/2016   ETH <5 09/20/2015    Metabolic Disorder Labs: Lab Results  Component Value Date   HGBA1C 8.5 (H) 09/23/2015   MPG 197 09/23/2015   No results found for: PROLACTIN Lab Results  Component Value Date   CHOL 146 10/17/2015   TRIG 87 10/17/2015   HDL 68 10/17/2015   CHOLHDL 2.1 10/17/2015   VLDL 17 10/17/2015   LDLCALC 61 10/17/2015   LDLCALC 73 09/23/2015    Physical Findings: AIMS: Facial and Oral Movements Muscles of Facial Expression: None, normal Lips and Perioral Area: None, normal Jaw: None, normal Tongue: None, normal,Extremity Movements Upper (arms, wrists, hands, fingers): None, normal Lower (legs, knees, ankles, toes): None, normal, Trunk Movements Neck, shoulders, hips: None, normal, Overall Severity Severity of abnormal movements (highest score from questions above): None, normal Incapacitation due to abnormal movements: None, normal Patient's awareness of  abnormal movements (rate only patient's report): No Awareness, Dental Status Current problems with teeth and/or dentures?: No Does patient usually wear dentures?: No  CIWA:  CIWA-Ar Total: 0 COWS:  COWS Total Score: 1  Musculoskeletal: Strength & Muscle Tone: within normal limits- not currently presenting with  tremors, diaphoresis, or acute psychomotor restlessness  Gait & Station: normal Patient leans: N/A  Psychiatric Specialty Exam: Physical Exam  Nursing note and vitals reviewed. Constitutional: He is oriented to person, place, and time. He appears well-developed and well-nourished.  HENT:  Head: Normocephalic.  Cardiovascular: Normal rate.   Musculoskeletal: Normal range of motion.  Neurological: He is alert and oriented to person, place, and time.  No tremors   Psychiatric: His behavior is normal.    Review of Systems  Neurological: Negative for tremors.  Psychiatric/Behavioral: Positive for substance abuse. Negative for depression. The patient is nervous/anxious and has insomnia.   All other systems reviewed and are negative.  denies headache, denies visual disturbances, no vomiting .  Blood pressure 113/73, pulse 87, temperature 98.7 F (37.1 C), temperature source Oral, resp. rate 18, height 5' 9.5" (1.765 m), weight 174 lb (78.9 kg), SpO2 100 %.Body mass index is 25.33 kg/m.  General Appearance: Fairly Groomed  Eye Contact:  Good  Speech:  Normal Rate  Volume:  Normal  Mood:  Anxious-mild  Affect:  Appropriate and mildly anxious   Thought Process:  Linear  Orientation:  Full (Time, Place, and Person)  Thought Content:  denies hallucinations, no delusions, not internally preoccupied   Suicidal Thoughts:  No- at this time denies suicidal ideations, denies self injurious ideations, contracts for safety on unit   Homicidal Thoughts:  No-  Memory:  recent and remote grossly intact   Judgement:  Other:  improving   Insight:  improving   Psychomotor Activity:  Normal    Concentration:  Concentration: Good and Attention Span: Good  Recall:  Good  Fund of Knowledge:  Good  Language:  Good  Akathisia:  Negative  Handed:  Right  AIMS (if indicated):     Assets:  Desire for Improvement Resilience  ADL's:  Intact  Cognition:  WNL  Sleep:  Number of Hours: 6.75   Assessment 30 year old man with alcohol use disorder, marijuana use disorder, type I DM  presented with SI of hanging himself in the context of relapsing alcohol and marijuana use, breaking up with his girlfriend.   # Alcohol use disorder # Marijuana use disorder He is motivated for sobriety. Will uptitrate gabapentin for alcohol craving, which would be also helpful for his anxiety. Will plan to refer to residential program. No significant withdrawal symptoms reported on today's evaluation.   # MDD # Unspecified anxiety disorder # r/o substance induced mood disorder Continue fluoxetine. Will uptitrate gabapentin as above. Will have prn hydroxyzine available for his anxiety. He would greatly benefit from CBT treatment to target his anxiety once he completes residential treatment.   Plans Increase Neurontin 200 mg -200 mg- 300 mg Completed ciwa protocol Continue Prozac 20 mgrs QDAY for depression  Discontinue Seroquel given limited benefit Start hydroxyzine 25 mg TID prn for anxiety, insomnia Continue DM management  Continue to encourage group and milieu attendance to work on coping skills and symptom reduction  Continue to encourage efforts to work on relapse prevention and recovery efforts   Treatment Plan Summary: Daily contact with patient to assess and evaluate symptoms and progress in treatment, Medication management, Plan inpatient treatment  and medication management as below      Neysa Hotter, MD 08/07/2016, 11:58 AM

## 2016-08-07 NOTE — [facility] Group Notes (Signed)
[facility] LCSW Group Therapy 08/07/2016 1:15 PM Type of Therapy: Group Therapy Participation Level: Active  Participation Quality: Attentive, Sharing and Supportive  Affect: Appropriate  Cognitive: Alert and Oriented  Insight: Developing/Improving and Engaged  Engagement in Therapy: Developing/Improving and Engaged  Modes of Intervention: Activity, Clarification, Confrontation, Discussion, Education, Exploration, Limit-setting, Orientation, Problem-solving, Rapport Building, Dance movement psychotherapisteality Testing, Socialization and Support  Summary of Progress/Problems: Patient was attentive and engaged with speaker from [employer]. Patient was attentive to speaker while they shared their story of dealing with mental health and overcoming it. Patient expressed interest in their programs and services and received information on their agency. Patient processed ways they can relate to the speaker.   Micheal BruinKristin Linford Quintela, LCSW Clinical Social Worker [facility] [phone]

## 2016-08-07 NOTE — Tx Team (Signed)
Interdisciplinary Treatment Plan Update (Adult) Date: 08/07/2016    Time Reviewed: 9:30 AM  Progress in Treatment: Attending groups: Yes Participating in groups: Yes Taking medication as prescribed: Yes Tolerating medication: Yes Family/Significant other contact made: No, patient has declined collateral contacts Patient understands diagnosis: Yes Discussing patient identified problems/goals with staff: Yes Medical problems stabilized or resolved: Yes Denies suicidal/homicidal ideation: Yes Issues/concerns per patient self-inventory: Yes Other:  New problem(s) identified: N/A  Discharge Plan or Barriers: Patient is interested in residential treatment. Referrals pending at [facility].    Reason for Continuation of Hospitalization:  Depression Anxiety Medication Stabilization   Comments: N/A  Estimated length of stay: 2-3 days   Patient is a 30 year old male who presented to the hospital with increased depression and SI. Primary triggers for admission include recent break up. Patient will benefit from crisis stabilization, medication evaluation, group therapy and psycho education in addition to case management for discharge planning. At discharge, it is recommended that Pt remain compliant with established discharge plan and continued treatment.   Review of initial/current patient goals per problem list:  1. Goal(s): Patient will participate in aftercare plan   Met: No   Target date: 3-5 days post admission date   As evidenced by: Patient will participate within aftercare plan AEB aftercare provider and housing plan at discharge being identified.  8/10: Goal met. Home with outpatient services.   8/15: Goal progressing. Patient requesting residential treatment at [facility].    2. Goal (s): Patient will exhibit decreased depressive symptoms and suicidal ideations.   Met: Yes   Target date: 3-5 days post admission date   As evidenced by: Patient will  utilize self rating of depression at 3 or below and demonstrate decreased signs of depression or be deemed stable for discharge by MD.  8/10: Goal not met: Pt presents with flat affect and depressed mood.  Pt admitted with high levels of depression. Pt to show decreased sign of depression and a rating of 3 or less before d/c.     8/14: Goal met. Patient rates depression at 2.  4. Goal(s): Patient will demonstrate decreased signs of withdrawal due to substance abuse   Met: Yes   Target date: 3-5 days post admission date   As evidenced by: Patient will produce a CIWA/COWS score of 0, have stable vitals signs, and no symptoms of withdrawal  8/10: Goal met. No withdrawal symptoms reported at this time per medical chart.   Attendees: Patient:    Family:    Physician: Dr. Modesta Messing 08/07/2016 9:30 AM  Nursing: Otilio Carpen, Desma Paganini, RN 08/07/2016 9:30 AM  Clinical Social Worker: Erasmo Downer Fiza Nation, LCSW 08/07/2016 9:30 AM  Other: Peri Maris, LCSW 08/07/2016 9:30 AM  Other:  08/07/2016 9:30 AM  Other: Lars Pinks, Case Manager 08/07/2016 9:30 AM  Other: Agustina Caroli, May Augustin, NP 08/07/2016 9:30 AM  Other:    Other:        Scribe for Treatment Team:  Tilden Fossa, [place]

## 2016-08-08 DIAGNOSIS — Z79899 Other long term (current) drug therapy: Secondary | ICD-10-CM

## 2016-08-08 DIAGNOSIS — Z803 Family history of malignant neoplasm of breast: Secondary | ICD-10-CM

## 2016-08-08 DIAGNOSIS — Z791 Long term (current) use of non-steroidal anti-inflammatories (NSAID): Secondary | ICD-10-CM

## 2016-08-08 DIAGNOSIS — F1290 Cannabis use, unspecified, uncomplicated: Secondary | ICD-10-CM

## 2016-08-08 DIAGNOSIS — F17290 Nicotine dependence, other tobacco product, uncomplicated: Secondary | ICD-10-CM

## 2016-08-08 LAB — GLUCOSE, CAPILLARY
Glucose-Capillary: 202 mg/dL — ABNORMAL HIGH (ref 65–99)
Glucose-Capillary: 224 mg/dL — ABNORMAL HIGH (ref 65–99)
Glucose-Capillary: 400 mg/dL — ABNORMAL HIGH (ref 65–99)
Glucose-Capillary: 279 mg/dL — ABNORMAL HIGH (ref 65–99)

## 2016-08-08 NOTE — [facility] Group Notes (Signed)
[facility] LCSW Group Therapy 08/08/2016  1:15 PM Type of Therapy: Group Therapy Participation Level: Active  Participation Quality: Attentive, Sharing and Supportive  Affect: Appropriate  Cognitive: Alert and Oriented  Insight: Developing/Improving and Engaged  Engagement in Therapy: Developing/Improving and Engaged  Modes of Intervention: Clarification, Confrontation, Discussion, Education, Exploration, Limit-setting, Orientation, Problem-solving, Rapport Building, Dance movement psychotherapisteality Testing, Socialization and Support  Summary of Progress/Problems: The topic for group today was emotional regulation. This group focused on both positive and negative emotion identification and allowed group members to process ways to identify feelings, regulate negative emotions, and find healthy ways to manage internal/external emotions. Group members were asked to reflect on a time when their reaction to an emotion led to a negative outcome and explored how alternative responses using emotion regulation would have benefited them. Group members were also asked to discuss a time when emotion regulation was utilized when a negative emotion was experienced. Patient expressed a tendency to overanalyze situations and difficulty managing perceived rejection and anxiety. CSW and other group members provided emotional support and encouragement.   Samuella BruinKristin Yeily Link, MSW, LCSW Clinical Social Worker [facility] [phone]

## 2016-08-08 NOTE — Progress Notes (Signed)
Nursing Note 08/08/2016 [id number]  Data Reports sleeping good with new sleep med.  Rates depression 0/10, hopelessness 2/10, and anxiety 5/10. Affect wide-ranged, appropriate, bright. Mood euthymic.  Does c/o some anxiety.  Denies HI, SI, AVH.  Remains safe and appropriate on unit, pleasant and appropriate with staff and peers.  Attending groups, involved in treatment.  Still verbalizes concerns, wishes to be discharged directly into another treatment program.   Action Spoke with patient 1:1, nurse offered support to patient throughout shift.  Continues to be monitored on 15 minute checks for safety.  Response Remains safe and appropriate on unit.

## 2016-08-08 NOTE — Progress Notes (Signed)
Recreation Therapy Notes  Date: 08/08/16 Time: 0930 Location: [address] Group Room  Group Topic: Stress Management  Goal Area(s) Addresses:  Patient will verbalize importance of using healthy stress management.  Patient will identify positive emotions associated with healthy stress management.   Behavioral Response: Engaged  Intervention: Stress Management   Activity :  Progressive Muscle Relaxation.  LRT introduced the technique of progressive muscle relaxation to patients.  Patients were to follow along with LRT as script was read to participate in activity.  Education:  Stress Management, Discharge Planning.   Education Outcome: Acknowledges edcuation/In group clarification offered/Needs additional education  Clinical Observations/Feedback: Pt attended group.   Caroll RancherMarjette Raneshia Derick, LRT/CTRS        Lillia AbedLindsay, Shineka Auble A 08/08/2016 3:01 PM

## 2016-08-08 NOTE — Progress Notes (Signed)
Adult Psychoeducational Group Note  Date:  08/08/2016 Time:  7:14 PM  Group Topic/Focus:  Orientation:   The focus of this group is to educate the patient on the purpose and policies of crisis stabilization and provide a format to answer questions about their admission.  The group details unit policies and expectations of patients while admitted.   Participation Level:  Active  Participation Quality:  Appropriate and Attentive  Affect:  Appropriate  Cognitive:  Appropriate  Insight: Appropriate and Good  Engagement in Group:  Engaged  Modes of Intervention:  Discussion, Limit-setting and Orientation  Additional Comments:   Writer had a orientation group with patient for personal development, discussing hygiene and goal setting for patients reasoning for being in the hospital. Discussed with patient unit rules of the facility, the expectation of participating in groups and discussions of contraband and treatment agreement with not having contraband on the unit. Working on recovery and ways to manage self care and medication regimen. Pt was receptive to information received and shared during group.  Karleen HampshireFox, Monchel Pollitt Brittini 08/08/2016, 7:14 PM

## 2016-08-08 NOTE — [facility] Group Notes (Signed)
Patient attend group his day was a 7. His goal to talk and social wih other patients. He meet his goal he found long term care for himself.

## 2016-08-08 NOTE — Progress Notes (Addendum)
Patient ID: Jarold SongDevon Deroche, male   DOB: 06/02/1986, 30 y.o.   MRN: [id number]  Pt currently presents with an anxious affect and cooperative behavior. Pt reports to Clinical research associatewriter that their goal is to "figure out where I am going." Pt states "how long do people normally stay here, I want to get help but I'm worried about when I leave." Pt reports poor sleep with current medication regimen, states "I took Doxepin once and it really helped me, I asked them to take me off of the Seroquel today because I haven't slept the past two days on it." Pt denies any cravings tonight.  Pt provided with medications per providers orders. Provider notified of pt sleep medication concerns, see MAR. Pt's labs and vitals were monitored throughout the night. Pt supported emotionally and encouraged to express concerns and questions. Pt educated on medications.  Pt's safety ensured with 15 minute and environmental checks. Pt currently denies SI/HI and A/V hallucinations. Pt verbally agrees to seek staff if SI/HI or A/VH occurs and to consult with staff before acting on any harmful thoughts. Will continue POC.

## 2016-08-08 NOTE — Progress Notes (Signed)
Inpatient Diabetes Program Recommendations  AACE/ADA: New Consensus Statement on Inpatient Glycemic Control (2015)  Target Ranges:  Prepandial:   less than 140 mg/dL      Peak postprandial:   less than 180 mg/dL (1-2 hours)      Critically ill patients:  140 - 180 mg/dL   Results for Jarold SongMARLOW, Kacy (MRN [id number]) as of 08/08/2016 09:40  Ref. Range 08/07/2016 06:30 08/07/2016 12:15 08/07/2016 17:22 08/07/2016 21:54  Glucose-Capillary Latest Ref Range: 65 - 99 mg/dL 409274 (H) 811278 (H) 914321 (H) 324 (H)   Results for Jarold SongMARLOW, Helaman (MRN [id number]) as of 08/08/2016 09:40  Ref. Range 08/08/2016 06:08  Glucose-Capillary Latest Ref Range: 65 - 99 mg/dL 086202 (H)    Home DM Meds: Lantus 35 units QHS Novolog SSI  Current Insulin Orders: Lantus 28 units QHS   Novolog Moderate Correction Scale/ SSI (0-15 units) TID AC Novolog 6 units tidwc     MD- Note patient with elevated fasting glucose and elevated postprandial glucose levels.  Please consider the following in-hospital insulin adjustments based on review of glucose trends and insulin given:  1. Increase Lantus to 30 units QHS   2. Increase Novolog Meal Coverage to: Novolog 8 units tidwc      --Will follow patient during hospitalization--  Ambrose FinlandJeannine Johnston Lendy Dittrich RN, MSN, CDE Diabetes Coordinator Inpatient Glycemic Control Team Team Pager: [phone] (8a-5p)

## 2016-08-08 NOTE — Progress Notes (Signed)
Patient ID: Micheal Lopez, male   DOB: [birth year], 30 y.o.   MRN: [id number] Patient ID: Micheal Lopez, male   DOB: [birth year], 30 y.o.   MRN: [id number] [facility] MD Progress Note  08/08/2016 2:41 PM Micheal Lopez  MRN:  [id number]  Subjective: Micheal Lopez says, I'm still not sleeping well. The Seroquel was not helping, was discontinued as a result. He feels anxious at baseline and would like to have some medication. He is planning on going to the [facility] or [facility] after discharge.  Principal Problem: Major depressive disorder, recurrent severe without psychotic features (HCC) Diagnosis:   Patient Active Problem List   Diagnosis Date Noted  . Major depressive disorder, recurrent severe without psychotic features (HCC) [F33.2] 07/31/2016  . Alcohol intoxication (HCC) [F10.129]   . Alcohol use disorder, severe, dependence (HCC) [F10.20] 09/21/2015  . Suicidal ideation [R45.851]    Total Time spent with patient: 15 minutes  Past Medical History:  Past Medical History:  Diagnosis Date  . Depression    per pt.  . Diabetes mellitus without complication (HCC)    Type I   History reviewed. No pertinent surgical history.  Family History:  Family History  Problem Relation Age of Onset  . Cancer Maternal Grandmother     BREAST     Social History:  History  Alcohol Use  . Yes    Comment: occasional      History  Drug Use  . Frequency: 7.0 times per week  . Types: Marijuana    Social History   Social History  . Marital status: Legally Separated    Spouse name: N/A  . Number of children: N/A  . Years of education: N/A   Social History Main Topics  . Smoking status: Current Every Day Smoker    Types: E-cigarettes  . Smokeless tobacco: Never Used  . Alcohol use Yes     Comment: occasional   . Drug use:     Frequency: 7.0 times per week    Types: Marijuana  . Sexual activity: No   Other Topics Concern  . None   Social History Narrative  . None   Additional Social  History:   Sleep: Good  Appetite:  Good  Current Medications: Current Facility-Administered Medications  Medication Dose Route Frequency Provider Last Rate Last Dose  . doxepin (SINEQUAN) capsule 10 mg  10 mg Oral QHS Kerry Hough, PA-C   10 mg at 08/07/16 2317  . FLUoxetine (PROZAC) capsule 20 mg  20 mg Oral Daily Charm Rings, NP   20 mg at 08/08/16 0738  . folic acid (FOLVITE) tablet 1 mg  1 mg Oral Daily Charm Rings, NP   1 mg at 08/08/16 0737  . gabapentin (NEURONTIN) capsule 200 mg  200 mg Oral BID Neysa Hotter, MD   200 mg at 08/08/16 0737  . gabapentin (NEURONTIN) capsule 300 mg  300 mg Oral QHS Adonis Brook, NP   300 mg at 08/07/16 2157  . hydrOXYzine (ATARAX/VISTARIL) tablet 25 mg  25 mg Oral TID PRN Neysa Hotter, MD      . ibuprofen (ADVIL,MOTRIN) tablet 600 mg  600 mg Oral Q8H PRN Charm Rings, NP      . insulin aspart (novoLOG) injection 0-15 Units  0-15 Units Subcutaneous TID WC Charm Rings, NP   5 Units at 08/08/16 1215  . insulin aspart (novoLOG) injection 6 Units  6 Units Subcutaneous TID WC Adonis Brook, NP   6 Units at 08/08/16 1215  .  insulin glargine (LANTUS) injection 28 Units  28 Units Subcutaneous QHS Adonis BrookSheila Agustin, NP   28 Units at 08/07/16 2158  . magnesium hydroxide (MILK OF MAGNESIA) suspension 30 mL  30 mL Oral Daily PRN Charm RingsJamison Y Lord, NP      . multivitamin with minerals tablet 1 tablet  1 tablet Oral Daily Craige CottaFernando A Cobos, MD   1 tablet at 08/08/16 0737  . nicotine polacrilex (NICORETTE) gum 2 mg  2 mg Oral PRN Craige CottaFernando A Cobos, MD   2 mg at 08/08/16 1312  . thiamine (B-1) injection 100 mg  100 mg Intramuscular Once Craige CottaFernando A Cobos, MD      . thiamine (VITAMIN B-1) tablet 100 mg  100 mg Oral Daily Craige CottaFernando A Cobos, MD   100 mg at 08/08/16 [id number]   Lab Results:  Results for orders placed or performed during the hospital encounter of 08/01/16 (from the past 48 hour(s))  Glucose, capillary     Status: Abnormal   Collection Time: 08/06/16   5:06 PM  Result Value Ref Range   Glucose-Capillary 296 (H) 65 - 99 mg/dL  Glucose, capillary     Status: Abnormal   Collection Time: 08/06/16  9:17 PM  Result Value Ref Range   Glucose-Capillary 285 (H) 65 - 99 mg/dL  Glucose, capillary     Status: Abnormal   Collection Time: 08/07/16  6:30 AM  Result Value Ref Range   Glucose-Capillary 274 (H) 65 - 99 mg/dL  Glucose, capillary     Status: Abnormal   Collection Time: 08/07/16 12:15 PM  Result Value Ref Range   Glucose-Capillary 278 (H) 65 - 99 mg/dL   Comment 1 Notify RN   Glucose, capillary     Status: Abnormal   Collection Time: 08/07/16  5:22 PM  Result Value Ref Range   Glucose-Capillary 321 (H) 65 - 99 mg/dL   Comment 1 Notify RN   Glucose, capillary     Status: Abnormal   Collection Time: 08/07/16  9:54 PM  Result Value Ref Range   Glucose-Capillary 324 (H) 65 - 99 mg/dL  Glucose, capillary     Status: Abnormal   Collection Time: 08/08/16  6:08 AM  Result Value Ref Range   Glucose-Capillary 202 (H) 65 - 99 mg/dL  Glucose, capillary     Status: Abnormal   Collection Time: 08/08/16 12:09 PM  Result Value Ref Range   Glucose-Capillary 224 (H) 65 - 99 mg/dL   Comment 1 Notify RN    Blood Alcohol level:  Lab Results  Component Value Date   ETH 119 (H) 07/31/2016   ETH <5 09/20/2015   Metabolic Disorder Labs: Lab Results  Component Value Date   HGBA1C 8.5 (H) 09/23/2015   MPG 197 09/23/2015   No results found for: PROLACTIN Lab Results  Component Value Date   CHOL 146 10/17/2015   TRIG 87 10/17/2015   HDL 68 10/17/2015   CHOLHDL 2.1 10/17/2015   VLDL 17 10/17/2015   LDLCALC 61 10/17/2015   LDLCALC 73 09/23/2015   Physical Findings:  AIMS: Facial and Oral Movements Muscles of Facial Expression: None, normal Lips and Perioral Area: None, normal Jaw: None, normal Tongue: None, normal,Extremity Movements Upper (arms, wrists, hands, fingers): None, normal Lower (legs, knees, ankles, toes): None, normal,  Trunk Movements Neck, shoulders, hips: None, normal, Overall Severity Severity of abnormal movements (highest score from questions above): None, normal Incapacitation due to abnormal movements: None, normal Patient's awareness of abnormal movements (rate only patient's report):  No Awareness, Dental Status Current problems with teeth and/or dentures?: No Does patient usually wear dentures?: No  CIWA:  CIWA-Ar Total: 0 COWS:  COWS Total Score: 1  Musculoskeletal: Strength & Muscle Tone: within normal limits- not currently presenting with tremors, diaphoresis, or acute psychomotor restlessness  Gait & Station: normal Patient leans: N/A  Psychiatric Specialty Exam: Physical Exam  Nursing note and vitals reviewed. Constitutional: He is oriented to person, place, and time. He appears well-developed and well-nourished.  HENT:  Head: Normocephalic.  Cardiovascular: Normal rate.   Musculoskeletal: Normal range of motion.  Neurological: He is alert and oriented to person, place, and time.  No tremors   Psychiatric: His behavior is normal.    Review of Systems  Neurological: Negative for tremors.  Psychiatric/Behavioral: Positive for substance abuse. Negative for depression. The patient is nervous/anxious and has insomnia.   All other systems reviewed and are negative.  denies headache, denies visual disturbances, no vomiting .  Blood pressure 120/80, pulse 79, temperature 98.6 F (37 C), temperature source Oral, resp. rate 18, height 5' 9.5" (1.765 m), weight 78.9 kg (174 lb), SpO2 100 %.Body mass index is 25.33 kg/m.  General Appearance: Fairly Groomed  Eye Contact:  Good  Speech:  Normal Rate  Volume:  Normal  Mood:  Anxious-mild  Affect:  Appropriate and mildly anxious   Thought Process:  Linear  Orientation:  Full (Time, Place, and Person)  Thought Content:  denies hallucinations, no delusions, not internally preoccupied   Suicidal Thoughts:  No- at this time denies suicidal  ideations, denies self injurious ideations, contracts for safety on unit   Homicidal Thoughts:  No-  Memory:  recent and remote grossly intact   Judgement:  Other:  improving   Insight:  improving   Psychomotor Activity:  Normal   Concentration:  Concentration: Good and Attention Span: Good  Recall:  Good  Fund of Knowledge:  Good  Language:  Good  Akathisia:  Negative  Handed:  Right  AIMS (if indicated):     Assets:  Desire for Improvement Resilience  ADL's:  Intact  Cognition:  WNL  Sleep:  Number of Hours: 6   Assessment 30 year old man with alcohol use disorder, marijuana use disorder, type I DM  presented with SI of hanging himself in the context of relapsing alcohol and marijuana use, breaking up with his girlfriend.   # Alcohol use disorder # Marijuana use disorder He is motivated for sobriety. Will uptitrate gabapentin for alcohol craving, which would be also helpful for his anxiety. Will plan to refer to residential program. No significant withdrawal symptoms reported on today's evaluation.   # MDD # Unspecified anxiety disorder # r/o substance induced mood disorder Continue fluoxetine. Will uptitrate gabapentin as above. Will have prn hydroxyzine available for his anxiety. He would greatly benefit from CBT treatment to target his anxiety once he completes residential treatment.   Treatment Plan Summary: Daily contact with patient to assess and evaluate symptoms and progress in treatment, Medication management, Plan inpatient treatment  and medication management as below   Plans Continue Neurontin 200 mg -200 mg- 300 mg Completed ciwa protocol. Continue Prozac 20 mgrs QDAY for depression  Continue hydroxyzine 25 mg TID prn for anxiety, insomnia Continue DM management  Continue to encourage group and milieu attendance to work on coping skills and symptom reduction  Continue to encourage efforts to work on relapse prevention and recovery efforts.  Sanjuana KavaNwoko, Barkley Kratochvil I, NP  , PMHNP, FNP-BC. 08/08/2016, 2:41  PM

## 2016-08-09 LAB — GLUCOSE, CAPILLARY
Glucose-Capillary: 253 mg/dL — ABNORMAL HIGH (ref 65–99)
Glucose-Capillary: 205 mg/dL — ABNORMAL HIGH (ref 65–99)

## 2016-08-09 MED ORDER — HYDROXYZINE HCL 25 MG PO TABS: 25.0000 mg | ORAL_TABLET | Freq: Three times a day (TID) | ORAL | 0 refills | Status: DC | PRN

## 2016-08-09 MED ORDER — GABAPENTIN 300 MG PO CAPS: 300.0000 mg | ORAL_CAPSULE | Freq: Every day | ORAL | 0 refills | Status: DC

## 2016-08-09 MED ORDER — NICOTINE POLACRILEX 2 MG MT GUM: 2.0000 mg | CHEWING_GUM | OROMUCOSAL | 0 refills | Status: DC | PRN

## 2016-08-09 MED ORDER — FLUOXETINE HCL 20 MG PO CAPS: 20.0000 mg | ORAL_CAPSULE | Freq: Every day | ORAL | 0 refills | Status: DC

## 2016-08-09 MED ORDER — GABAPENTIN 100 MG PO CAPS: 200.0000 mg | ORAL_CAPSULE | Freq: Two times a day (BID) | ORAL | 0 refills | Status: DC

## 2016-08-09 NOTE — Discharge Summary (Signed)
Physician Discharge Summary Note  Patient:  Micheal Lopez is an 30 y.o., male MRN:  [id number] DOB:  14-Nov-1986 Patient phone:  [phone] (home)  Patient address:   [address] [place] [id number],  Total Time spent with patient: 30 minutes  Date of Admission:  08/01/2016 Date of Discharge: 08/09/2016  Reason for Admission:  ETOH and THC  Principal Problem: Major depressive disorder, recurrent severe without psychotic features [facility](HCC) Discharge Diagnoses: Patient Active Problem List   Diagnosis Date Noted  . 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 Alcohol intoxication (HCC) [F10.129]   . Alcohol use disorder, severe, dependence (HCC) [F10.20] 09/21/2015  . Suicidal ideation [R45.851]     Past Psychiatric History:  See HPI  Past Medical History:  Past Medical History:  Diagnosis Date  . Depression    per pt.  . Diabetes mellitus without complication (HCC)    Type I   History reviewed. No pertinent surgical history. Family History:  Family History  Problem Relation Age of Onset  . Cancer Maternal Grandmother     BREAST    Family Psychiatric  History:  See HPI Social History:  History  Alcohol Use  . Yes    Comment: occasional      History  Drug Use  . Frequency: 7.0 times per week  . Types: Marijuana    Social History   Social History  . Marital status: Legally Separated    Spouse name: N/A  . Number of children: N/A  . Years of education: N/A   Social History Main Topics  . Smoking status: Current Every Day Smoker    Types: E-cigarettes  . Smokeless tobacco: Never Used  . Alcohol use Yes     Comment: occasional   . Drug use:     Frequency: 7.0 times per week    Types: Marijuana  . Sexual activity: No   Other Topics Concern  . None   Social History Narrative  . None    Hospital Course:  [facility] 30 [facility]  complains of having suicidal thoughts for  the past day.  He says he may want to hang himself but that he does not have a specific plan. Patient has been drinking and using THC.    Micheal Songevon Fischbach was admitted for Major depressive disorder, recurrent severe without psychotic features (HCC) and crisis management.   He was treated with Prozac 20 mg daily depression, Gabapentin 200 mg BID anxiety, Hydroxyzine 25 mg PRN anxiety.  Medical problems were identified and treated as needed.  Home medications were restarted as appropriate.  Improvement was monitored by observation and [facility] daily report of symptom reduction.  Emotional and mental status was monitored by daily self inventory reports completed by Micheal Songevon Mainwaring and clinical staff.  Patient reported continued improvement, denied any new concerns.  Patient had been compliant on medications and denied side effects.  Support and encouragement was provided.    Patient encouraged to attend groups to help with recognizing triggers of emotional crises and de-stabilizations.  Patient encouraged to attend group to help identify the positive things in life that would help in dealing with feelings of loss, depression and unhealthy or abusive tendencies.         Micheal Songevon Yin was evaluated by the treatment team for stability and plans for continued recovery upon discharge.  He was offered further treatment options upon discharge including Residential, Intensive Outpatient and Outpatient treatment.  He will follow up with agencies listed below for medication management and counseling.  Encouraged patient to maintain satisfactory support network and home environment.  Advised to adhere to medication compliance and outpatient treatment follow up.  Prescriptions provided.       [employer] motivation was an integral factor for scheduling further treatment.  Employment, transportation, bed availability, health status, family support, and any pending legal issues were also considered during his hospital stay.   Upon completion of this admission the patient was both mentally and medically stable for discharge denying suicidal/homicidal ideation, auditory/visual/tactile hallucinations, delusional thoughts and paranoia.      Physical Findings: AIMS: Facial and Oral Movements Muscles of Facial Expression: None, normal Lips and Perioral Area: None, normal Jaw: None, normal Tongue: None, normal,Extremity Movements Upper (arms, wrists, hands, fingers): None, normal Lower (legs, knees, ankles, toes): None, normal, Trunk Movements Neck, shoulders, hips: None, normal, Overall Severity Severity of abnormal movements (highest score from questions above): None, normal Incapacitation due to abnormal movements: None, normal Patient's awareness of abnormal movements (rate only patient's report): No Awareness, Dental Status Current problems with teeth and/or dentures?: No Does patient usually wear dentures?: No  CIWA:  CIWA-Ar Total: 0 COWS:  COWS Total Score: 1  Musculoskeletal: Strength & Muscle Tone: within normal limits Gait & Station: normal Patient leans: N/A  Psychiatric Specialty Exam:  SEE MD SRA Physical Exam  ROS  Blood pressure 125/73, pulse 82, temperature 98.8 F (37.1 C), temperature source Oral, resp. rate 18, height 5' 9.5" (1.765 m), weight 78.9 kg (174 lb), SpO2 100 %.Body mass index is 25.33 kg/m.    Have you used any form of tobacco in the last 30 days? (Cigarettes, Smokeless Tobacco, Cigars, and/or Pipes): Yes  Has this patient used any form of tobacco in the last 30 days? (Cigarettes, Smokeless Tobacco, Cigars, and/or Pipes) Yes, given to patient  Blood Alcohol level:  Lab Results  Component Value Date   ETH 119 (H) 07/31/2016   ETH <5 09/20/2015    Metabolic Disorder Labs:  Lab Results  Component Value Date   HGBA1C 8.5 (H) 09/23/2015   MPG 197 09/23/2015   No results found for: PROLACTIN Lab Results  Component Value Date   CHOL 146 10/17/2015   TRIG 87 10/17/2015    HDL 68 10/17/2015   CHOLHDL 2.1 10/17/2015   VLDL 17 10/17/2015   LDLCALC 61 10/17/2015   LDLCALC 73 09/23/2015    See Psychiatric Specialty Exam and Suicide Risk Assessment completed by Attending Physician prior to discharge.  Discharge destination:  Home  Is patient on multiple antipsychotic therapies at discharge:  No   Has Patient had three or more failed trials of antipsychotic monotherapy by history:  No  Recommended Plan for Multiple Antipsychotic Therapies: NA     Medication List    STOP taking these medications   FLUoxetine 10 MG capsule Commonly known as:  PROZAC   FLUoxetine 20 MG tablet Commonly known as:  PROZAC Replaced by:  FLUoxetine 20 MG capsule   ibuprofen 800 MG tablet Commonly known as:  ADVIL,MOTRIN   insulin aspart 100 UNIT/ML injection Commonly known as:  novoLOG   insulin glargine 100 UNIT/ML injection Commonly known as:  LANTUS   insulin NPH Human 100 UNIT/ML injection Commonly known as:  NOVOLIN N     TAKE these medications     Indication  FLUoxetine 20 MG capsule Commonly known as:  PROZAC Take 1 capsule (20 mg total) by mouth daily. Replaces:  FLUoxetine 20 MG tablet  Indication:  Major Depressive Disorder   gabapentin 100 MG capsule Commonly known as:  NEURONTIN Take 2 capsules (200 mg total) by mouth 2 (two) times daily.  Indication:  Agitation   gabapentin 300 MG capsule Commonly known as:  NEURONTIN Take 1 capsule (300 mg total) by mouth at bedtime.  Indication:  Alcohol Withdrawal Syndrome, alcohol cravings, anxiety   hydrOXYzine 25 MG tablet Commonly known as:  ATARAX/VISTARIL Take 1 tablet (25 mg total) by mouth 3 (three) times daily as needed (anxiety, insomnia).  Indication:  Anxiety Neurosis   nicotine polacrilex 2 MG gum Commonly known as:  NICORETTE Take 1 each (2 mg total) by mouth as needed for smoking cessation.  Indication:  Nicotine Addiction      Follow-up Information    [facility] Follow up on 08/22/2016.   Why:  Assessment for therapy services with [name] on Wednesday August 30th at 10am. Call office if you need to reschedule.  Contact information: [facility] [address] [place], [place] [address] [phone]        [facility] Follow up on 08/10/2016.   Specialty:  Behavioral Health Why:  Medication management appt on Friday 8/18 at 11:30am with Dartha LodgeAnthony Steele.  Contact information: [address] [place] [address] [phone]        [facility] Follow up on 08/13/2016.   Why:  Assessment for possible admission on Monday 08/13/16. Bring your ID, social security card, and 2 week supply of medications, and clothing. Call if you need to reschedule. Please arrive at 11:45am Contact information: [address] [place] [address] [phone]          Follow-up recommendations:  Activity:  as tol Diet:  as tol  Comments:  1.  Take all your medications as prescribed.   2.  Report any adverse side effects to outpatient provider. 3.  Patient instructed to not use alcohol or illegal drugs while on prescription medicines. 4.  In the event of worsening symptoms, instructed patient to call 911, the crisis hotline or go to nearest emergency room for evaluation of symptoms.  Signed: Lindwood QuaSheila May Kaylene Dawn, NP [facility] 08/09/2016, 2:03 PM

## 2016-08-09 NOTE — [facility] Group Notes (Signed)
[facility] Group Notes:  (Nursing/MHT/Case Management/Adjunct)  Date:  08/09/2016  Time:  11:35 AM  Type of Therapy:  Nurse Education  Participation Level:  Active  Participation Quality:  Appropriate and Attentive  Affect:  Appropriate  Cognitive:  Alert and Appropriate  Insight:  Appropriate and Good  Engagement in Group:  Engaged  Modes of Intervention:  Discussion and Education  Summary of Progress/Problems: Topic was on stress management. Discussed the importance of choosing a positive coping skills and healthy leisure activities. Patient reports using music and art as an outlet to release stress.  Group encouraged to surround themselves with positive and healthy group/support system when changing to a healthy lifestyle.  Patient was receptive and contributed. Patient states goal is to define silver lining and dark cloud.    Mickie Baillizabeth O Iwenekha 08/09/2016, 11:35 AM

## 2016-08-09 NOTE — Tx Team (Signed)
Interdisciplinary Treatment Plan Update (Adult) Date: 08/09/2016    Time Reviewed: 9:30 AM  Progress in Treatment: Attending groups: Yes Participating in groups: Yes Taking medication as prescribed: Yes Tolerating medication: Yes Family/Significant other contact made: No, patient has declined collateral contacts Patient understands diagnosis: Yes Discussing patient identified problems/goals with staff: Yes Medical problems stabilized or resolved: Yes Denies suicidal/homicidal ideation: Yes Issues/concerns per patient self-inventory: Yes Other:  New problem(s) identified: N/A  Discharge Plan or Barriers: Patient is interested in residential treatment. [facility] screening scheduled for Monday 8/21. Patient plans to return home to await screening.   Reason for Continuation of Hospitalization:  Depression Anxiety Medication Stabilization   Comments: N/A  Estimated length of stay: Discharge anticipated for today 08/10/16   Patient is a 30 year old male who presented to the hospital with increased depression and SI. Primary triggers for admission include recent break up. Patient will benefit from crisis stabilization, medication evaluation, group therapy and psycho education in addition to case management for discharge planning. At discharge, it is recommended that Pt remain compliant with established discharge plan and continued treatment.   Review of initial/current patient goals per problem list:  1. Goal(s): Patient will participate in aftercare plan   Met: Yes   Target date: 3-5 days post admission date   As evidenced by: Patient will participate within aftercare plan AEB aftercare provider and housing plan at discharge being identified.  8/10: Goal met. Home with outpatient services.   8/15: Goal progressing. Patient requesting residential treatment at [facility].  8/17: Goal met. Patient plans to return home to await [facility] residential screening.     2. Goal  (s): Patient will exhibit decreased depressive symptoms and suicidal ideations.   Met: Yes   Target date: 3-5 days post admission date   As evidenced by: Patient will utilize self rating of depression at 3 or below and demonstrate decreased signs of depression or be deemed stable for discharge by MD.  8/10: Goal not met: Pt presents with flat affect and depressed mood.  Pt admitted with high levels of depression. Pt to show decreased sign of depression and a rating of 3 or less before d/c.     8/14: Goal met. Patient rates depression at 2.  4. Goal(s): Patient will demonstrate decreased signs of withdrawal due to substance abuse   Met: Yes   Target date: 3-5 days post admission date   As evidenced by: Patient will produce a CIWA/COWS score of 0, have stable vitals signs, and no symptoms of withdrawal  8/10: Goal met. No withdrawal symptoms reported at this time per medical chart.   Attendees: Patient:    Family:    Physician: Dr. Modesta Messing 08/09/2016 9:30 AM  Nursing: Larrie Kass., Desma Paganini, RN 08/09/2016 9:30 AM  Clinical Social Worker: Erasmo Downer Xaiver Roskelley, LCSW 08/09/2016 9:30 AM  Other: Peri Maris, LCSW 08/09/2016 9:30 AM  Other:  08/09/2016 9:30 AM  Other:  08/09/2016 9:30 AM  Other: Agustina Caroli, May Augustin, NP 08/09/2016 9:30 AM  Other:     Scribe for Treatment Team:  Tilden Fossa, [place]

## 2016-08-09 NOTE — Progress Notes (Signed)
Patient discharged to lobby. Patient was stable and appreciative at that time. All papers, samples and prescriptions were given and valuables returned. Verbal understanding expressed. Denies SI/HI and A/VH. Patient given opportunity to express concerns and ask questions.  

## 2016-08-09 NOTE — Progress Notes (Signed)
D: Pt was at the nurse's station upon initial approach.  Pt has appropriate affect and mood.  His goal is to "find out about aftercare."  Pt reports his plan was to "go to a rehab facility, [facility] or [facility], but they have like a 2 week waiting list so I don't know if that's going to happen.  My plan B is to do outpatient treatment."  Pt denies SI/HI, denies hallucinations, denies pain.  Pt has been visible in milieu interacting with peers and staff appropriately.  Pt attended evening group.    A: Introduced self to pt.  Met with pt and offered support and encouragement.  Actively listened to pt.  Medications administered per order.  PRN medication administered for insomnia.  R: Pt is compliant with medications.  Pt verbally contracts for safety.  Will continue to monitor and assess.

## 2016-08-09 NOTE — Progress Notes (Signed)
  [name] :  Will you be returning to the same living situation after discharge:  Yes,  patient plans to return home with mother to await [facility] residential screening At discharge, do you have transportation home?: Yes,  friends/family Do you have the ability to pay for your medications: Yes,  patient will be provided with prescriptions at discharge  Release of information consent forms completed and in the chart;  Patient's signature needed at discharge.  Patient to Follow up at: Follow-up Information    [facility] Follow up on 08/22/2016.   Why:  Assessment for therapy services with [name] on Wednesday August 30th at 10am. Call office if you need to reschedule.  Contact information: [facility] [address] [place], [place] [address] [phone]        [facility] Follow up on 08/10/2016.   Specialty:  Behavioral Health Why:  Medication management appt on Friday 8/18 at 11:30am with Dartha LodgeAnthony Steele.  Contact information: [address] [place] [address] [phone]        [facility] Follow up on 08/13/2016.   Why:  Assessment for possible admission on Monday 08/13/16. Bring your ID, social security card, and 2 week supply of medications, and clothing. Call if you need to reschedule. Please arrive at 11:45am Contact information: [address] [place] [address] [phone]           Next level of care provider has access to [facility] Link:no  Safety Planning and Suicide Prevention discussed: Yes,  with patient  Have you used any form of tobacco in the last 30 days? (Cigarettes, Smokeless Tobacco, Cigars, and/or Pipes): Yes  Has patient been referred to the Quitline?: Patient refused referral  Patient has been referred for addiction treatment: Yes  Micheal Lopez Micheal Lopez 08/09/2016, 1:26 PM

## 2016-08-09 NOTE — [facility] Suicide Risk Assessment (Signed)
[facility] Discharge Suicide Risk Assessment   Principal Problem: Major depressive disorder, recurrent severe without psychotic features [facility](HCC) Discharge Diagnoses:  Patient Active Problem List   Diagnosis Date Noted  . Major depressive disorder, recurrent severe without psychotic features (HCC) [F33.2] 07/31/2016  . Alcohol intoxication (HCC) [F10.129]   . Alcohol use disorder, severe, dependence (HCC) [F10.20] 09/21/2015  . Suicidal ideation [R45.851]     Total Time spent with patient: 20 minutes  Musculoskeletal: Strength & Muscle Tone: within normal limits Gait & Station: normal Patient leans: N/A  Psychiatric Specialty Exam: Review of Systems  Psychiatric/Behavioral: The patient is nervous/anxious and has insomnia.   All other systems reviewed and are negative.   Blood pressure 125/73, pulse 82, temperature 98.8 F (37.1 C), temperature source Oral, resp. rate 18, height 5' 9.5" (1.765 m), weight 174 lb (78.9 kg), SpO2 100 %.Body mass index is 25.33 kg/m.  General Appearance: Casual  Eye Contact::  Good  Speech:  Normal Rate409  Volume:  Normal  Mood:  good  Affect:  Appropriate slightly anxious  Thought Process:  Coherent and Goal Directed  Orientation:  Full (Time, Place, and Person)  Thought Content:  Logical Perceptions: denies AH/VH  Suicidal Thoughts:  No  Homicidal Thoughts:  No  Memory:  good  Judgement:  Good  Insight:  Good  Psychomotor Activity:  Normal  Concentration:  Good  Recall:  Good  Fund of Knowledge:Good  Language: Good  Akathisia:  No  Handed:  Right  AIMS (if indicated):     Assets:  Communication Skills Desire for Improvement Social Support  Sleep:  Number of Hours: 6  Cognition: WNL  ADL's:  Intact   Mental Status Per Nursing Assessment::   On Admission:  Suicidal ideation indicated by patient, Suicide plan, Plan includes specific time, place, or method, Self-harm thoughts, Self-harm behaviors  Demographic Factors:  Male, Caucasian and  Unemployed  Loss Factors: Loss of significant relationship Break up with his girlfriend  Historical Factors: Prior suicide attempts  Risk Reduction Factors:   Living with another person, especially a relative, Positive social support and Positive therapeutic relationship  Continued Clinical Symptoms:  Severe Anxiety and/or Agitation Depression:   Insomnia  Cognitive Features That Contribute To Risk:  None    Suicide Risk:  Mild:  Suicidal ideation of limited frequency, intensity, duration, and specificity.  There are no identifiable plans, no associated intent, mild dysphoria and related symptoms, good self-control (both objective and subjective assessment), few other risk factors, and identifiable protective factors, including available and accessible social support.  Follow-up Information    [facility] Follow up on 08/10/2016.   Why:  Assessment for therapy services with [name] on Friday August 18th at 12pm. Call office if you need to reschedule.  Contact information: [facility] [address] [place], [place] [address] [phone]        [facility] Follow up on 08/10/2016.   Specialty:  Behavioral Health Why:  Medication management appt on Friday 8/18 at 11:30am with Dartha LodgeAnthony Steele.  Contact information: [address] [place] [address] [phone]        [facility] Follow up on 08/13/2016.   Why:  Assessment for possible admission on Monday 08/13/16. Bring your ID, social security card, and 2 week supply of medications, and clothing. Call if you need to reschedule. Please arrive at 11:45am Contact information: [address] [place] [address] [phone]  Patient will be discharge to his mother's house. He is motivated for sobriety, future oriented and denies SI.   Plan Of Care/Follow-up recommendations:  Activity:  full Diet:  regular Tests:   none Other:  follow up as above  Neysa Hottereina Aubrianne Molyneux, MD 08/09/2016, 12:17 PM

## 2016-08-09 NOTE — Progress Notes (Signed)
Inpatient Diabetes Program Recommendations  AACE/ADA: New Consensus Statement on Inpatient Glycemic Control (2015)  Target Ranges:  Prepandial:   less than 140 mg/dL      Peak postprandial:   less than 180 mg/dL (1-2 hours)      Critically ill patients:  140 - 180 mg/dL   Results for Jarold SongMARLOW, Mariusz (MRN [id number]) as of 08/09/2016 07:51  Ref. Range 08/08/2016 06:08 08/08/2016 12:09 08/08/2016 17:18 08/08/2016 21:01  Glucose-Capillary Latest Ref Range: 65 - 99 mg/dL 409202 (H) 811224 (H) 914400 (H) 279 (H)   Results for Jarold SongMARLOW, Devun (MRN [id number]) as of 08/09/2016 07:51  Ref. Range 08/09/2016 06:17  Glucose-Capillary Latest Ref Range: 65 - 99 mg/dL 086253 (H)    Home DM Meds: Lantus 35 units QHS Novolog SSI  Current Insulin Orders: Lantus 28 units QHS Novolog Moderate Correction Scale/ SSI (0-15 units) TID AC Novolog 6 units tidwc     MD- Note patient with elevated fasting glucose and elevated postprandial glucose levels.  Please consider the following in-hospital insulin adjustments based on review of glucose trends and insulin given:  1. Increase Lantus to 30 units QHS   2. Increase Novolog Meal Coverage to: Novolog 10 units tidwc    --Will follow patient during hospitalization--  Ambrose FinlandJeannine Johnston Tom Ragsdale RN, MSN, CDE Diabetes Coordinator Inpatient Glycemic Control Team Team Pager: [phone] (8a-5p)

## 2016-08-23 ENCOUNTER — Emergency Department (HOSPITAL_COMMUNITY)
Admission: EM | Admit: 2016-08-23 | Discharge: 2016-08-23 | Disposition: A | Discharge status: Other Acute Inpatient Hospital | Payer: Self-pay | Attending: Emergency Medicine | Admitting: Emergency Medicine

## 2016-08-23 ENCOUNTER — Encounter (HOSPITAL_COMMUNITY): Payer: Self-pay | Admitting: *Deleted

## 2016-08-23 ENCOUNTER — Observation Stay (HOSPITAL_COMMUNITY)
Admission: AD | Admit: 2016-08-23 | Discharge: 2016-08-24 | Disposition: A | Discharge status: Home / Self Care | Payer: Federal, State, Local not specified - Other | Source: Intra-hospital | Attending: Psychiatry | Admitting: Psychiatry

## 2016-08-23 ENCOUNTER — Ambulatory Visit (HOSPITAL_COMMUNITY)
Admission: EM | Admit: 2016-08-23 | Discharge: 2016-08-23 | Disposition: A | Discharge status: Home / Self Care | Payer: Federal, State, Local not specified - Other | Source: Intra-hospital | Attending: Psychiatry | Admitting: Psychiatry

## 2016-08-23 DIAGNOSIS — Z794 Long term (current) use of insulin: Secondary | ICD-10-CM | POA: Insufficient documentation

## 2016-08-23 DIAGNOSIS — F1014 Alcohol abuse with alcohol-induced mood disorder: Secondary | ICD-10-CM | POA: Diagnosis present

## 2016-08-23 DIAGNOSIS — Z79899 Other long term (current) drug therapy: Secondary | ICD-10-CM | POA: Insufficient documentation

## 2016-08-23 DIAGNOSIS — F1024 Alcohol dependence with alcohol-induced mood disorder: Principal | ICD-10-CM | POA: Insufficient documentation

## 2016-08-23 DIAGNOSIS — R45851 Suicidal ideations: Secondary | ICD-10-CM

## 2016-08-23 DIAGNOSIS — F1994 Other psychoactive substance use, unspecified with psychoactive substance-induced mood disorder: Secondary | ICD-10-CM | POA: Insufficient documentation

## 2016-08-23 DIAGNOSIS — E109 Type 1 diabetes mellitus without complications: Secondary | ICD-10-CM | POA: Insufficient documentation

## 2016-08-23 DIAGNOSIS — F10229 Alcohol dependence with intoxication, unspecified: Secondary | ICD-10-CM | POA: Insufficient documentation

## 2016-08-23 DIAGNOSIS — F1290 Cannabis use, unspecified, uncomplicated: Secondary | ICD-10-CM | POA: Insufficient documentation

## 2016-08-23 DIAGNOSIS — G4700 Insomnia, unspecified: Secondary | ICD-10-CM | POA: Insufficient documentation

## 2016-08-23 DIAGNOSIS — F332 Major depressive disorder, recurrent severe without psychotic features: Secondary | ICD-10-CM | POA: Insufficient documentation

## 2016-08-23 DIAGNOSIS — F1020 Alcohol dependence, uncomplicated: Secondary | ICD-10-CM

## 2016-08-23 DIAGNOSIS — F17210 Nicotine dependence, cigarettes, uncomplicated: Secondary | ICD-10-CM | POA: Insufficient documentation

## 2016-08-23 DIAGNOSIS — F411 Generalized anxiety disorder: Secondary | ICD-10-CM | POA: Insufficient documentation

## 2016-08-23 LAB — COMPREHENSIVE METABOLIC PANEL
ALBUMIN: 4.5 g/dL (ref 3.5–5.0)
ALT: 30 U/L (ref 17–63)
ANION GAP: 9 (ref 5–15)
AST: 21 U/L (ref 15–41)
Alkaline Phosphatase: 66 U/L (ref 38–126)
BILIRUBIN TOTAL: 0.9 mg/dL (ref 0.3–1.2)
BUN: 14 mg/dL (ref 6–20)
CHLORIDE: 105 mmol/L (ref 101–111)
CO2: 23 mmol/L (ref 22–32)
Calcium: 9.1 mg/dL (ref 8.9–10.3)
Creatinine, Ser: 0.95 mg/dL (ref 0.61–1.24)
GFR calc Af Amer: >60 mL/min (ref >60)
GLUCOSE: 192 mg/dL — AB (ref 65–99)
POTASSIUM: 3.7 mmol/L (ref 3.5–5.1)
Sodium: 137 mmol/L (ref 135–145)
TOTAL PROTEIN: 7.3 g/dL (ref 6.5–8.1)

## 2016-08-23 LAB — CBC
HCT: 43.8 % (ref 39.0–52.0)
Hemoglobin: 15.9 g/dL (ref 13.0–17.0)
MCH: 31.7 pg (ref 26.0–34.0)
MCHC: 36.3 g/dL — ABNORMAL HIGH (ref 30.0–36.0)
MCV: 87.3 fL (ref 78.0–100.0)
PLATELETS: 238 10*3/uL (ref 150–400)
RBC: 5.02 MIL/uL (ref 4.22–5.81)
RDW: 13 % (ref 11.5–15.5)
WBC: 8.5 10*3/uL (ref 4.0–10.5)

## 2016-08-23 LAB — CBG MONITORING, ED
Glucose-Capillary: 182 mg/dL — ABNORMAL HIGH (ref 65–99)
Glucose-Capillary: 181 mg/dL — ABNORMAL HIGH (ref 65–99)

## 2016-08-23 LAB — RAPID URINE DRUG SCREEN, HOSP PERFORMED
Amphetamines: NOT DETECTED
BENZODIAZEPINES: NOT DETECTED
Barbiturates: NOT DETECTED
COCAINE: NOT DETECTED
Opiates: NOT DETECTED
Tetrahydrocannabinol: NOT DETECTED

## 2016-08-23 LAB — ETHANOL: ALCOHOL ETHYL (B): 99 mg/dL — AB (ref <5)

## 2016-08-23 LAB — ACETAMINOPHEN LEVEL

## 2016-08-23 LAB — GLUCOSE, CAPILLARY
Glucose-Capillary: 285 mg/dL — ABNORMAL HIGH (ref 65–99)
Glucose-Capillary: 199 mg/dL — ABNORMAL HIGH (ref 65–99)
Glucose-Capillary: 206 mg/dL — ABNORMAL HIGH (ref 65–99)

## 2016-08-23 LAB — SALICYLATE LEVEL: Salicylate Lvl: <4 mg/dL (ref 2.8–30.0)

## 2016-08-23 MED ORDER — INSULIN GLARGINE 100 UNIT/ML ~~LOC~~ SOLN
35.0000 [IU] | Freq: Every day | SUBCUTANEOUS | Status: DC
  Administered 2016-08-23: 35 [IU] via SUBCUTANEOUS

## 2016-08-23 MED ORDER — FLUOXETINE HCL 20 MG PO CAPS
20.0000 mg | ORAL_CAPSULE | Freq: Every day | ORAL | Status: DC
  Administered 2016-08-23: 20 mg via ORAL
  Filled 2016-08-23: qty 1

## 2016-08-23 MED ORDER — INSULIN ASPART 100 UNIT/ML ~~LOC~~ SOLN: 0.0000 [IU] | Freq: Three times a day (TID) | SUBCUTANEOUS | Status: DC

## 2016-08-23 MED ORDER — ALUM & MAG HYDROXIDE-SIMETH 200-200-20 MG/5ML PO SUSP: 30.0000 mL | ORAL | Status: DC | PRN

## 2016-08-23 MED ORDER — ACETAMINOPHEN 325 MG PO TABS: 650.0000 mg | ORAL_TABLET | ORAL | Status: DC | PRN

## 2016-08-23 MED ORDER — LORAZEPAM 1 MG PO TABS: 0.0000 mg | ORAL_TABLET | Freq: Four times a day (QID) | ORAL | Status: DC

## 2016-08-23 MED ORDER — GABAPENTIN 100 MG PO CAPS
200.0000 mg | ORAL_CAPSULE | Freq: Two times a day (BID) | ORAL | Status: DC
  Administered 2016-08-23: 200 mg via ORAL
  Filled 2016-08-23: qty 2

## 2016-08-23 MED ORDER — NICOTINE POLACRILEX 2 MG MT GUM: 2.0000 mg | CHEWING_GUM | OROMUCOSAL | Status: DC | PRN

## 2016-08-23 MED ORDER — INSULIN GLARGINE 100 UNIT/ML ~~LOC~~ SOLN: 35.0000 [IU] | Freq: Every day | SUBCUTANEOUS | Status: DC

## 2016-08-23 MED ORDER — MAGNESIUM HYDROXIDE 400 MG/5ML PO SUSP: 30.0000 mL | Freq: Every day | ORAL | Status: DC | PRN

## 2016-08-23 MED ORDER — LORAZEPAM 1 MG PO TABS
0.0000 mg | ORAL_TABLET | Freq: Four times a day (QID) | ORAL | Status: DC
  Administered 2016-08-24: 1 mg via ORAL
  Filled 2016-08-23: qty 1

## 2016-08-23 MED ORDER — HYDROXYZINE HCL 25 MG PO TABS: 25.0000 mg | ORAL_TABLET | Freq: Three times a day (TID) | ORAL | Status: DC | PRN

## 2016-08-23 MED ORDER — ACETAMINOPHEN 325 MG PO TABS: 650.0000 mg | ORAL_TABLET | Freq: Four times a day (QID) | ORAL | Status: DC | PRN

## 2016-08-23 MED ORDER — ACAMPROSATE CALCIUM 333 MG PO TBEC
333.0000 mg | DELAYED_RELEASE_TABLET | Freq: Three times a day (TID) | ORAL | Status: DC
  Administered 2016-08-24 (×2): 333 mg via ORAL
  Filled 2016-08-23 (×8): qty 1

## 2016-08-23 MED ORDER — FLUOXETINE HCL 20 MG PO CAPS
20.0000 mg | ORAL_CAPSULE | Freq: Every day | ORAL | Status: DC
  Administered 2016-08-24: 20 mg via ORAL
  Filled 2016-08-23: qty 1

## 2016-08-23 MED ORDER — LORAZEPAM 1 MG PO TABS: 0.0000 mg | ORAL_TABLET | Freq: Two times a day (BID) | ORAL | Status: DC

## 2016-08-23 MED ORDER — GABAPENTIN 100 MG PO CAPS: 200.0000 mg | ORAL_CAPSULE | Freq: Two times a day (BID) | ORAL | Status: DC

## 2016-08-23 MED ORDER — INSULIN GLARGINE 100 UNIT/ML ~~LOC~~ SOLN
35.0000 [IU] | Freq: Every day | SUBCUTANEOUS | Status: DC
Start: 2016-08-23 — End: 2016-08-23

## 2016-08-23 MED ORDER — ONDANSETRON HCL 4 MG PO TABS: 4.0000 mg | ORAL_TABLET | Freq: Three times a day (TID) | ORAL | Status: DC | PRN

## 2016-08-23 MED ORDER — NICOTINE 21 MG/24HR TD PT24: 21.0000 mg | MEDICATED_PATCH | Freq: Every day | TRANSDERMAL | Status: DC

## 2016-08-23 MED ORDER — INSULIN ASPART 100 UNIT/ML ~~LOC~~ SOLN: 4.0000 [IU] | Freq: Three times a day (TID) | SUBCUTANEOUS | Status: DC

## 2016-08-23 MED ORDER — GABAPENTIN 300 MG PO CAPS
300.0000 mg | ORAL_CAPSULE | Freq: Every day | ORAL | Status: DC
  Administered 2016-08-23: 300 mg via ORAL
  Filled 2016-08-23: qty 1

## 2016-08-23 MED ORDER — INSULIN ASPART 100 UNIT/ML ~~LOC~~ SOLN
0.0000 [IU] | Freq: Three times a day (TID) | SUBCUTANEOUS | Status: DC
  Administered 2016-08-23: 8 [IU] via SUBCUTANEOUS
  Administered 2016-08-23: 3 [IU] via SUBCUTANEOUS
  Administered 2016-08-24: 8 [IU] via SUBCUTANEOUS

## 2016-08-23 MED ORDER — ZOLPIDEM TARTRATE 5 MG PO TABS
5.0000 mg | ORAL_TABLET | Freq: Every evening | ORAL | Status: DC | PRN
  Administered 2016-08-23: 5 mg via ORAL
  Filled 2016-08-23: qty 1

## 2016-08-23 MED ORDER — GABAPENTIN 300 MG PO CAPS: 300.0000 mg | ORAL_CAPSULE | Freq: Every day | ORAL | Status: DC

## 2016-08-23 MED ORDER — GABAPENTIN 100 MG PO CAPS
200.0000 mg | ORAL_CAPSULE | Freq: Two times a day (BID) | ORAL | Status: DC
  Administered 2016-08-23 – 2016-08-24 (×2): 200 mg via ORAL
  Filled 2016-08-23 (×2): qty 2

## 2016-08-23 MED ORDER — INSULIN ASPART 100 UNIT/ML ~~LOC~~ SOLN
3.0000 [IU] | Freq: Once | SUBCUTANEOUS | Status: AC
  Administered 2016-08-23: 3 [IU] via SUBCUTANEOUS

## 2016-08-23 MED ORDER — DOXEPIN HCL 25 MG PO CAPS
25.0000 mg | ORAL_CAPSULE | Freq: Every evening | ORAL | Status: DC | PRN
  Filled 2016-08-23: qty 1

## 2016-08-23 MED ORDER — MELATONIN 3 MG PO TABS: 6.0000 mg | ORAL_TABLET | Freq: Every evening | ORAL | Status: DC | PRN

## 2016-08-23 NOTE — ED Notes (Signed)
D:  Pt admitted for medical clearance and [facility]. Pt denies SI/HI/AVH. Pt is pleasant and cooperative. Pt stated [facility] and he went to the rehab facility they did not accept him due to him being a diabetic (needing Insulin). A: Pt was offered support and encouragement.Q 15 minute checks were done for safety.  R: safety maintained on unit.

## 2016-08-23 NOTE — ED Provider Notes (Signed)
[facility]-EMERGENCY DEPT Provider Note   CSN: [id number] Arrival date & time: 08/23/16  0430     History   Chief Complaint Chief Complaint  Patient presents with  . Medical Clearance    HPI Micheal Lopez is a 30 y.o. male.  [facility]. He is to be evaluated in the AM by psychiatry. [facility] requesting assistance with alcohol use and abuse. He reports drinking 3 40-ounce beers tonight. He states that he can drink up to 1/5 of liquor in a day. No hx of seizures from ETOH withdrawal. No SI/HI. Patient denies illicit drug use other than marijuana.      Past Medical History:  Diagnosis Date  . Depression    per pt.  . Diabetes mellitus without complication (HCC)    Type I    Patient Active Problem List   Diagnosis Date Noted  . Major depressive disorder, recurrent severe without psychotic features (HCC) 07/31/2016  . Alcohol intoxication (HCC)   . Alcohol use disorder, severe, dependence (HCC) 09/21/2015  . Suicidal ideation     History reviewed. No pertinent surgical history.     Home Medications    Prior to Admission medications   Medication Sig Start Date End Date Taking? Authorizing Provider  DOXEPIN HCL PO Take 1 capsule by mouth at bedtime as needed (sleep).   Yes Historical Provider, MD  FLUoxetine (PROZAC) 20 MG capsule Take 1 capsule (20 mg total) by mouth daily. 08/09/16  Yes Adonis BrookSheila Agustin, NP  gabapentin (NEURONTIN) 100 MG capsule Take 2 capsules (200 mg total) by mouth 2 (two) times daily. 08/09/16  Yes Adonis BrookSheila Agustin, NP  gabapentin (NEURONTIN) 300 MG capsule Take 1 capsule (300 mg total) by mouth at bedtime. 08/09/16  Yes Adonis BrookSheila Agustin, NP  insulin aspart (NOVOLOG) 100 UNIT/ML injection Inject 0-20 Units into the skin 3 (three) times daily before meals. Per sliding scale   Yes Historical Provider, MD  insulin glargine (LANTUS) 100 UNIT/ML injection Inject 35 Units into the skin at  bedtime.   Yes Historical Provider, MD  Melatonin 3 MG TABS Take 6 mg by mouth at bedtime as needed (sleep).   Yes Historical Provider, MD  hydrOXYzine (ATARAX/VISTARIL) 25 MG tablet Take 1 tablet (25 mg total) by mouth 3 (three) times daily as needed (anxiety, insomnia). Patient not taking: Reported on 08/23/2016 08/09/16   Adonis BrookSheila Agustin, NP  nicotine polacrilex (NICORETTE) 2 MG gum Take 1 each (2 mg total) by mouth as needed for smoking cessation. Patient not taking: Reported on 08/23/2016 08/09/16   Adonis BrookSheila Agustin, NP    Family History Family History  Problem Relation Age of Onset  . Cancer Maternal Grandmother     BREAST     Social History Social History  Substance Use Topics  . Smoking status: Current Every Day Smoker    Types: E-cigarettes  . Smokeless tobacco: Never Used  . Alcohol use Yes     Comment: binge drinking     Allergies   Review of patient's allergies indicates no known allergies.   Review of Systems Review of Systems  Psychiatric/Behavioral: Positive for behavioral problems.  Ten systems reviewed and are negative for acute change, except as noted in the HPI.     Physical Exam Updated Vital Signs BP 130/69 (BP Location: Left Arm)   Pulse 88   Temp 98.3 F (36.8 C) (Oral)   Resp 18   Ht 5\' 11"  (1.803 m)   Wt 80.3 kg  SpO2 97%   BMI 24.69 kg/m   Physical Exam  Constitutional: He is oriented to person, place, and time. He appears well-developed and well-nourished. No distress.  Nontoxic appearing  HENT:  Head: Normocephalic and atraumatic.  Eyes: Conjunctivae and EOM are normal. No scleral icterus.  Neck: Normal range of motion.  Pulmonary/Chest: Effort normal. No respiratory distress.  Respirations even and unlabored  Musculoskeletal: Normal range of motion.  Neurological: He is alert and oriented to person, place, and time.  Skin: Skin is warm and dry. No rash noted. He is not diaphoretic. No erythema. No pallor.  Psychiatric: He has a  normal mood and affect. His behavior is normal.  Clinically sober. No SI/HI. Speech clear.  Nursing note and vitals reviewed.    ED Treatments / Results  Labs (all labs ordered are listed, but only abnormal results are displayed) Labs Reviewed  COMPREHENSIVE METABOLIC PANEL - Abnormal; Notable for the following:       Result Value   Glucose, Bld 192 (*)    All other components within normal limits  ETHANOL - Abnormal; Notable for the following:    Alcohol, Ethyl (B) 99 (*)    All other components within normal limits  ACETAMINOPHEN LEVEL - Abnormal; Notable for the following:    Acetaminophen (Tylenol), Serum <10 (*)    All other components within normal limits  CBC - Abnormal; Notable for the following:    MCHC 36.3 (*)    All other components within normal limits  CBG MONITORING, ED - Abnormal; Notable for the following:    Glucose-Capillary 182 (*)    All other components within normal limits  SALICYLATE LEVEL  URINE RAPID DRUG SCREEN, HOSP PERFORMED    EKG  EKG Interpretation None       Radiology No results found.  Procedures Procedures (including critical care time)  Medications Ordered in ED Medications  LORazepam (ATIVAN) tablet 0-4 mg (not administered)    Followed by  LORazepam (ATIVAN) tablet 0-4 mg (not administered)     Initial Impression / Assessment and Plan / ED Course  I have reviewed the triage vital signs and the nursing notes.  Pertinent labs & imaging results that were available during my care of the patient were reviewed by me and considered in my medical decision making (see chart for details).  Clinical Course    30 year old male presents to the emergency department from [facility] for medical clearance. He has been medically cleared and is pending morning psychiatric evaluation. Disposition to be determined by oncoming ED provider.   Final Clinical Impressions(s) / ED Diagnoses   Final diagnoses:  Substance induced mood disorder [facility])      New Prescriptions New Prescriptions   No medications on file     Antony Madura, PA-C 08/23/16 1610    April Palumbo, MD 08/23/16 [phone]

## 2016-08-23 NOTE — ED Notes (Signed)
Bed: [facility] Expected date:  Expected time:  Means of arrival:  Comments: 

## 2016-08-23 NOTE — Progress Notes (Signed)
Admission Note: [facility] for continuation of care related to depression, ETOH abuse and withdrawal symptoms. Pt presents calm on initial contact. Denies SI, HI, AVH and pain at time of assessment. Per report, pt has been depressed about living with his mother, "My mom is never happy with whatever I do and my girlfriend stresses me out too". Pt states he did not remember lying on the road in a suicide attempt as per report. Pt states he drinks 3-5X /week "whatever I can get, like liquor or beer". "I think I drink to self medicate due to emotional pain but I don't know how that pain started". Pt reports he sometimes black out when he drinks but denies active withdrawal symptoms or recent DUI. Pt cooperative with initial nursing assessment. Belonging search done as per protocol, no contraband found. Pt's skin is intact without areas of break downs, multiple tattoos noted on bilateral arms and back of right leg. Unit orientation done and routines discussed with pt; understanding verbalized. Emotional support and availability provided. Pt encouraged to voice needs / concerns. Meal and fluids offered, was tolerated well. Vitals WNL, no physical distress evident thus far. Pt remain safe at present without self harm gestures or outburst. Will continue to monitor pt for safety and stabilization in mood.

## 2016-08-23 NOTE — ED Triage Notes (Signed)
[facility] and [facility]; pt is to be seen by psychiatry in the am; pt denies SI / HI; per [facility] pt could not contract for safety and [facility]; pt states that he would like help with his drinking; pt reports binge drinking; pt states that he drinks often but not daily

## 2016-08-23 NOTE — Progress Notes (Signed)
This pt stated that he was no longer having SI and wasn't sure why he was in this unit. He also stated that he was taking his medications regularly. This Clinical research associatewriter suggests that pt follow up with his provider as soon as possible to regulate medication.

## 2016-08-23 NOTE — ED Provider Notes (Signed)
I was asked to check into his insulin dosing. Currently, he has only NovoLog ordered without directions.  During recent hospitalization. He was seen by the diabetic care, coordinating nurse, and she noted that his current regimen is  Home DM Meds: Lantus 35 units QHS Novolog SSI  Current Insulin Orders: Lantus 35 units QHS   Novolog Moderate Correction Scale/ SSI (0-15 units) TID AC Novolog 4 units tidwc   After review of records, the above, routine care seems appropriate, so I have reordered it.   Mancel BaleElliott Willa Brocks, MD 08/23/16 [phone]

## 2016-08-23 NOTE — BH Assessment (Signed)
[facility] Assessment Progress Note  Per Thedore MinsMojeed Akintayo, MD, this pt would benefit [facility].  Lillia AbedLindsay, RN, [facility] Obs [facility]; they are ready to receive pt immediately.  Pt has signed Voluntary Admission and Consent for Treatment, as well as Consent to Release Information, and signed forms [facility].  Pt's nurse, Jan, has been notified, and agrees to send original paperwork along with pt via [facility], and to call report to [phone] or [phone].  Doylene Canninghomas Leasia Swann, MA Triage Specialist [phone]
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
## 2016-08-23 NOTE — ED Notes (Signed)
Pt has in belonging bag:  Black and gold shoes, black watch, green shirt, white shirt, plaid shorts, black belt, black and green phone, black wallet ([place] driver lic, [place], EBT card, (1) one dollar bill) cigs box, blue and grey box of insulin.

## 2016-08-23 NOTE — ED Notes (Signed)
[facility]. All items returned. EMTALA complete.

## 2016-08-24 LAB — GLUCOSE, CAPILLARY
GLUCOSE-CAPILLARY: 30 mg/dL — AB (ref 65–99)
GLUCOSE-CAPILLARY: 89 mg/dL (ref 65–99)

## 2016-08-24 MED ORDER — ACAMPROSATE CALCIUM 333 MG PO TBEC: 333.0000 mg | DELAYED_RELEASE_TABLET | Freq: Three times a day (TID) | ORAL | 0 refills | Status: DC

## 2016-08-24 MED ORDER — DOXEPIN HCL 25 MG PO CAPS: 25.0000 mg | ORAL_CAPSULE | Freq: Every evening | ORAL | 0 refills | Status: DC | PRN

## 2016-08-24 MED ORDER — HYDROXYZINE HCL 25 MG PO TABS: 25.0000 mg | ORAL_TABLET | Freq: Three times a day (TID) | ORAL | 0 refills | Status: DC | PRN

## 2016-08-24 MED ORDER — FLUOXETINE HCL 20 MG PO CAPS: 20.0000 mg | ORAL_CAPSULE | Freq: Every day | ORAL | 0 refills | Status: DC
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
## 2016-08-24 NOTE — Progress Notes (Signed)
Patient was slightly anxious and cooperative. Denied any withdrawal symptoms. Patient rated his anxiety at a 2-3, but denied any depression or hopelessness. Patient

## 2016-08-24 NOTE — Progress Notes (Addendum)
Hypoglycemic Event  CBG: 30 0630  Treatment: 15 GM carbohydrate snack  Symptoms: Sweaty  Follow-up CBG: Time:0650 CBG Result: 89  Possible Reasons for Event: Inadequate meal intake. Per Pt, "I usually eat more than that"  Comments/MD notified: On call PA notified    Safi Culotta T Tarin Johndrow

## 2016-08-24 NOTE — Progress Notes (Signed)
D: Pt at the time of assessment denied any form of depression, anxiety, pain, SI, HI or AVH; he states, "I am only here because I was intoxicated; they said I said I was going to hurt myself-I have no recollection of this." Pt remained flat. Pt remained calm and cooperative. A: Medications offered as prescribed.  Support, encouragement, and safe environment provided.  15-minute safety checks continue. R: Pt was med compliant. Safety checks continue.

## 2016-08-26 ENCOUNTER — Emergency Department (HOSPITAL_COMMUNITY)
Admission: EM | Admit: 2016-08-26 | Discharge: 2016-08-26 | Disposition: A | Discharge status: Home / Self Care | Payer: Self-pay | Attending: Emergency Medicine | Admitting: Emergency Medicine

## 2016-08-26 ENCOUNTER — Encounter (HOSPITAL_COMMUNITY): Payer: Self-pay | Admitting: Emergency Medicine

## 2016-08-26 DIAGNOSIS — F332 Major depressive disorder, recurrent severe without psychotic features: Secondary | ICD-10-CM | POA: Diagnosis present

## 2016-08-26 DIAGNOSIS — F17210 Nicotine dependence, cigarettes, uncomplicated: Secondary | ICD-10-CM | POA: Insufficient documentation

## 2016-08-26 DIAGNOSIS — F1010 Alcohol abuse, uncomplicated: Secondary | ICD-10-CM

## 2016-08-26 DIAGNOSIS — Z794 Long term (current) use of insulin: Secondary | ICD-10-CM | POA: Insufficient documentation

## 2016-08-26 DIAGNOSIS — F1994 Other psychoactive substance use, unspecified with psychoactive substance-induced mood disorder: Secondary | ICD-10-CM | POA: Insufficient documentation

## 2016-08-26 DIAGNOSIS — F1020 Alcohol dependence, uncomplicated: Secondary | ICD-10-CM

## 2016-08-26 DIAGNOSIS — E109 Type 1 diabetes mellitus without complications: Secondary | ICD-10-CM | POA: Insufficient documentation

## 2016-08-26 DIAGNOSIS — Z79899 Other long term (current) drug therapy: Secondary | ICD-10-CM | POA: Insufficient documentation

## 2016-08-26 LAB — COMPREHENSIVE METABOLIC PANEL
ALBUMIN: 4.3 g/dL (ref 3.5–5.0)
ALK PHOS: 63 U/L (ref 38–126)
ALT: 22 U/L (ref 17–63)
AST: 14 U/L — AB (ref 15–41)
Anion gap: 10 (ref 5–15)
BUN: 17 mg/dL (ref 6–20)
CALCIUM: 9.4 mg/dL (ref 8.9–10.3)
CHLORIDE: 108 mmol/L (ref 101–111)
CO2: 21 mmol/L — AB (ref 22–32)
CREATININE: 1.17 mg/dL (ref 0.61–1.24)
GFR calc Af Amer: >60 mL/min (ref >60)
GFR calc non Af Amer: >60 mL/min (ref >60)
GLUCOSE: 240 mg/dL — AB (ref 65–99)
Potassium: 4 mmol/L (ref 3.5–5.1)
SODIUM: 139 mmol/L (ref 135–145)
Total Bilirubin: 0.7 mg/dL (ref 0.3–1.2)
Total Protein: 7.2 g/dL (ref 6.5–8.1)

## 2016-08-26 LAB — RAPID URINE DRUG SCREEN, HOSP PERFORMED
AMPHETAMINES: NOT DETECTED
BARBITURATES: NOT DETECTED
Benzodiazepines: NOT DETECTED
COCAINE: NOT DETECTED
Opiates: NOT DETECTED
TETRAHYDROCANNABINOL: NOT DETECTED

## 2016-08-26 LAB — SALICYLATE LEVEL: Salicylate Lvl: <4 mg/dL (ref 2.8–30.0)

## 2016-08-26 LAB — CBG MONITORING, ED
GLUCOSE-CAPILLARY: 131 mg/dL — AB (ref 65–99)
GLUCOSE-CAPILLARY: 61 mg/dL — AB (ref 65–99)
GLUCOSE-CAPILLARY: 220 mg/dL — AB (ref 65–99)
Glucose-Capillary: 39 mg/dL — CL (ref 65–99)

## 2016-08-26 LAB — CBC
HEMATOCRIT: 43.7 % (ref 39.0–52.0)
HEMOGLOBIN: 15.6 g/dL (ref 13.0–17.0)
MCH: 31.3 pg (ref 26.0–34.0)
MCHC: 35.7 g/dL (ref 30.0–36.0)
MCV: 87.6 fL (ref 78.0–100.0)
Platelets: 234 10*3/uL (ref 150–400)
RBC: 4.99 MIL/uL (ref 4.22–5.81)
RDW: 13 % (ref 11.5–15.5)
WBC: 9.4 10*3/uL (ref 4.0–10.5)

## 2016-08-26 LAB — ETHANOL: Alcohol, Ethyl (B): 25 mg/dL — ABNORMAL HIGH (ref <5)

## 2016-08-26 LAB — ACETAMINOPHEN LEVEL: Acetaminophen (Tylenol), Serum: <10 ug/mL — ABNORMAL LOW (ref 10–30)

## 2016-08-26 MED ORDER — INSULIN GLARGINE 100 UNIT/ML ~~LOC~~ SOLN: 35.0000 [IU] | Freq: Every day | SUBCUTANEOUS | Status: DC

## 2016-08-26 MED ORDER — INSULIN ASPART 100 UNIT/ML ~~LOC~~ SOLN: 0.0000 [IU] | Freq: Three times a day (TID) | SUBCUTANEOUS | Status: DC

## 2016-08-26 MED ORDER — GABAPENTIN 300 MG PO CAPS: 300.0000 mg | ORAL_CAPSULE | Freq: Every day | ORAL | Status: DC

## 2016-08-26 MED ORDER — NICOTINE POLACRILEX 2 MG MT GUM: 2.0000 mg | CHEWING_GUM | OROMUCOSAL | Status: DC | PRN

## 2016-08-26 MED ORDER — HYDROXYZINE HCL 25 MG PO TABS: 25.0000 mg | ORAL_TABLET | Freq: Three times a day (TID) | ORAL | Status: DC | PRN

## 2016-08-26 MED ORDER — FLUOXETINE HCL 20 MG PO CAPS
20.0000 mg | ORAL_CAPSULE | Freq: Every day | ORAL | Status: DC
  Administered 2016-08-26: 20 mg via ORAL
  Filled 2016-08-26: qty 1

## 2016-08-26 MED ORDER — INSULIN ASPART 100 UNIT/ML ~~LOC~~ SOLN: 0.0000 [IU] | Freq: Every day | SUBCUTANEOUS | Status: DC

## 2016-08-26 MED ORDER — GABAPENTIN 100 MG PO CAPS: 200.0000 mg | ORAL_CAPSULE | Freq: Two times a day (BID) | ORAL | Status: DC

## 2016-08-26 MED ORDER — INSULIN ASPART 100 UNIT/ML ~~LOC~~ SOLN
0.0000 [IU] | Freq: Three times a day (TID) | SUBCUTANEOUS | Status: DC
Start: 2016-08-26 — End: 2016-08-26
  Administered 2016-08-26: 5 [IU] via SUBCUTANEOUS
  Filled 2016-08-26: qty 1

## 2016-08-26 NOTE — ED Notes (Signed)
Pt behavior calm and cooperative. Pt voicing no complaints at this time. Pt compliant with morning medication regimen. Pt denies SI/HI. Denies AVH. Special checks q 15 mins in place for safety. Video monitoring in place.

## 2016-08-26 NOTE — ED Notes (Signed)
SBAR Report received from previous nurse. Pt received calm and visible on unit. Pt denies current SI/ HI, A/V H, depression, anxiety, and pain at this time, and is otherwise stable. Pt reminded of camera surveillance, q 15 min rounds, and rules of the milieu. Will continue to assess. 

## 2016-08-26 NOTE — ED Notes (Signed)
Pt has home medications and glucometer with him.  States he just took his blood sugar and it was 380.  Gave himself 13 units of novalog.
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
## 2016-08-26 NOTE — ED Notes (Signed)
Pt discharged home per MD order. Discharge summary reviewed with pt. Pt verbalizes understanding of discharge summary and outpatient resources. Pt denies SI/HI. Denies AVH. Pt signed for personal belongings and property returned. Pt signed e-signature. Ambulatory off unit.

## 2016-08-26 NOTE — ED Notes (Signed)
Notified RN of CBG of 39. RN spoke with pt, gave orange juice and directed this writer to recheck CBG at 0810.

## 2016-08-26 NOTE — ED Triage Notes (Signed)
Pt presents BIB by [facility] after they received a call that he may be suicidal.  States he began having suicidal thoughts a few hours ago.  Was seen recently and treated for same.  Has been drinking today, two 40 oz beers.  States he has a "couple of different ideas to kill himself, that it "wouldn't be hard"".

## 2016-08-26 NOTE — Consult Note (Signed)
[facility] Face-to-Face Psychiatry Consult   Reason for Consult:  Suicidal Ideation Referring Physician:  EDP Patient Identification: Micheal Lopez MRN:  [id number] Principal Diagnosis: Major depressive disorder, recurrent severe without psychotic features [facility]) Diagnosis:   Patient Active Problem List   Diagnosis Date Noted  . Alcohol abuse with alcohol-induced mood disorder ([place]) [F10.14] 08/23/2016  . Major depressive disorder, recurrent severe without psychotic features ([place]) [F33.2] 07/31/2016  . Alcohol intoxication ([place]) [F10.129]   . Alcohol use disorder, severe, dependence ([place]) [F10.20] 09/21/2015  . Suicidal ideation [R45.851]     Total Time spent with patient: 45 minutes  Subjective:   Micheal Lopez is a 30 y.o. male patient presented to [facility] with complaints of suicidal ideation.  HPI:  Patient seen by this provider along with Dr. Louretta Shorten and chart reviewed 08/26/16.   On evaluation:  Micheal Lopez reports that he was brought to the hospital by the police because he sent out a text stating "I want to kill myself" and it was concerned friend that called police.  Patient reports "I was at [facility] a month ago for 2 weeks and I left here going to [facility] and then [facility] says they can't take me cause I'm Type 1 diabetic.  I don't really want to kill my self.  I was just left home a lone all day and got into an argument with my girlfriend."  Patient reports that he is doing Counseling weekly with [facility] but feels that he needs more outpatient services to occupy his time and keep him busy. Discussed social work helping him with resource for outpatient for substance abuse and psychiatry.   Past Psychiatric History: Depression, Alcohol Use Disorder.  Prior inpatient services recently discharged from [facility] OBS 08/24/16 and prior inpatient 08/09/16.  Risk to Self: Suicidal Ideation: Yes-Currently Present Suicidal Intent: Yes-Currently Present Is patient at risk for suicide?: Yes Suicidal  Plan?: Yes-Currently Present Specify Current Suicidal Plan: 1. overdose on insulin, 2. "drink self stupid" 3. "drink self stupid and lay in the middle of the road". 4.  taking a bunch of pills.  Access to Means: Yes Specify Access to Suicidal Means: access to insulin, prescriptions and alcohol.  What has been your use of drugs/alcohol within the last 12 months?: Alcohol and marijuana use reported.  How many times?:  (multiple ) Other Self Harm Risks: None  Triggers for Past Attempts: Family contact, Other personal contacts Intentional Self Injurious Behavior: None Risk to Others: Homicidal Ideation: No Thoughts of Harm to Others: No Current Homicidal Intent: No Current Homicidal Plan: No Access to Homicidal Means: No Identified Victim: N/A History of harm to others?: Yes Assessment of Violence: In distant past Violent Behavior Description: per previous assessment: some fights in the past.  Does patient have access to weapons?: No Criminal Charges Pending?: No Does patient have a court date: No Prior Inpatient Therapy: Prior Inpatient Therapy: Yes Prior Therapy Dates: 07/31/16, 08/2015; 10 years ago Prior Therapy Facilty/Provider(s): [facility]; [facility] [place] Reason for Treatment: SA, SI Prior Outpatient Therapy: Prior Outpatient Therapy: Yes Prior Therapy Dates: For the last month Prior Therapy Facilty/Provider(s): [facility] & [facility] Reason for Treatment: med management & therapy Does patient have an ACCT team?: No Does patient have Intensive In-House Services?  : No Does patient have [facility] services? : No Does patient have P4CC services?: No  Past Medical History:  Past Medical History:  Diagnosis Date  . Depression    per pt.  . Diabetes mellitus without complication (  [place])    Type I   History reviewed. No pertinent surgical history. Family History:  Family History  Problem Relation Age of Onset  . Cancer Maternal Grandmother     BREAST     Family Psychiatric  History: None Social History:  History  Alcohol Use  . Yes    Comment: binge drinking     History  Drug Use  . Frequency: 7.0 times per week  . Types: Marijuana    Social History   Social History  . Marital status: Legally Separated    Spouse name: N/A  . Number of children: N/A  . Years of education: N/A   Social History Main Topics  . Smoking status: Current Every Day Smoker    Packs/day: 1.00    Types: E-cigarettes, Cigarettes  . Smokeless tobacco: Never Used  . Alcohol use Yes     Comment: binge drinking  . Drug use:     Frequency: 7.0 times per week    Types: Marijuana  . Sexual activity: No   Other Topics Concern  . None   Social History Narrative  . None   Additional Social History:    Allergies:  No Known Allergies  Labs:  Results for orders placed or performed during the hospital encounter of 08/26/16 (from the past 48 hour(s))  Rapid urine drug screen (hospital performed)     Status: None   Collection Time: 08/26/16  1:49 AM  Result Value Ref Range   Opiates NONE DETECTED NONE DETECTED   Cocaine NONE DETECTED NONE DETECTED   Benzodiazepines NONE DETECTED NONE DETECTED   Amphetamines NONE DETECTED NONE DETECTED   Tetrahydrocannabinol NONE DETECTED NONE DETECTED   Barbiturates NONE DETECTED NONE DETECTED    Comment:        DRUG SCREEN FOR MEDICAL PURPOSES ONLY.  IF CONFIRMATION IS NEEDED FOR ANY PURPOSE, NOTIFY LAB WITHIN 5 DAYS.        LOWEST DETECTABLE LIMITS FOR URINE DRUG SCREEN Drug Class       Cutoff (ng/mL) Amphetamine      1000 Barbiturate      200 Benzodiazepine   376 Tricyclics       283 Opiates          300 Cocaine          300 THC              50   Comprehensive metabolic panel     Status: Abnormal   Collection Time: 08/26/16  2:34 AM  Result Value Ref Range   Sodium 139 135 - 145 mmol/L   Potassium 4.0 3.5 - 5.1 mmol/L   Chloride 108 101 - 111 mmol/L   CO2 21 (L) 22 - 32 mmol/L   Glucose, Bld  240 (H) 65 - 99 mg/dL   BUN 17 6 - 20 mg/dL   Creatinine, Ser 1.17 0.61 - 1.24 mg/dL   Calcium 9.4 8.9 - 10.3 mg/dL   Total Protein 7.2 6.5 - 8.1 g/dL   Albumin 4.3 3.5 - 5.0 g/dL   AST 14 (L) 15 - 41 U/L   ALT 22 17 - 63 U/L   Alkaline Phosphatase 63 38 - 126 U/L   Total Bilirubin 0.7 0.3 - 1.2 mg/dL   GFR calc non Af Amer >60 >60 mL/min   GFR calc Af Amer >60 >60 mL/min    Comment: (NOTE) The eGFR has been calculated using the CKD EPI equation. This calculation has not been validated in all clinical  situations. eGFR's persistently <60 mL/min signify possible Chronic Kidney Disease.    Anion gap 10 5 - 15  Ethanol     Status: Abnormal   Collection Time: 08/26/16  2:34 AM  Result Value Ref Range   Alcohol, Ethyl (B) 25 (H) <5 mg/dL    Comment:        LOWEST DETECTABLE LIMIT FOR SERUM ALCOHOL IS 5 mg/dL FOR MEDICAL PURPOSES ONLY   Salicylate level     Status: None   Collection Time: 08/26/16  2:34 AM  Result Value Ref Range   Salicylate Lvl <9.6 2.8 - 30.0 mg/dL  Acetaminophen level     Status: Abnormal   Collection Time: 08/26/16  2:34 AM  Result Value Ref Range   Acetaminophen (Tylenol), Serum <10 (L) 10 - 30 ug/mL    Comment:        THERAPEUTIC CONCENTRATIONS VARY SIGNIFICANTLY. A RANGE OF 10-30 ug/mL MAY BE AN EFFECTIVE CONCENTRATION FOR MANY PATIENTS. HOWEVER, SOME ARE BEST TREATED AT CONCENTRATIONS OUTSIDE THIS RANGE. ACETAMINOPHEN CONCENTRATIONS >150 ug/mL AT 4 HOURS AFTER INGESTION AND >50 ug/mL AT 12 HOURS AFTER INGESTION ARE OFTEN ASSOCIATED WITH TOXIC REACTIONS.   cbc     Status: None   Collection Time: 08/26/16  2:34 AM  Result Value Ref Range   WBC 9.4 4.0 - 10.5 K/uL   RBC 4.99 4.22 - 5.81 MIL/uL   Hemoglobin 15.6 13.0 - 17.0 g/dL   HCT 43.7 39.0 - 52.0 %   MCV 87.6 78.0 - 100.0 fL   MCH 31.3 26.0 - 34.0 pg   MCHC 35.7 30.0 - 36.0 g/dL   RDW 13.0 11.5 - 15.5 %   Platelets 234 150 - 400 K/uL  CBG monitoring, ED     Status: Abnormal    Collection Time: 08/26/16  7:53 AM  Result Value Ref Range   Glucose-Capillary 39 (LL) 65 - 99 mg/dL   Comment 1 Notify RN   CBG monitoring, ED     Status: Abnormal   Collection Time: 08/26/16  8:08 AM  Result Value Ref Range   Glucose-Capillary 61 (L) 65 - 99 mg/dL  CBG monitoring, ED     Status: Abnormal   Collection Time: 08/26/16  8:24 AM  Result Value Ref Range   Glucose-Capillary 131 (H) 65 - 99 mg/dL  CBG monitoring, ED     Status: Abnormal   Collection Time: 08/26/16 12:52 PM  Result Value Ref Range   Glucose-Capillary 220 (H) 65 - 99 mg/dL    Current Facility-Administered Medications  Medication Dose Route Frequency Provider Last Rate Last Dose  . FLUoxetine (PROZAC) capsule 20 mg  20 mg Oral Daily Antonietta Breach, PA-C   20 mg at 08/26/16 0949  . [START ON 08/27/2016] gabapentin (NEURONTIN) capsule 200 mg  200 mg Oral BID Antonietta Breach, PA-C      . gabapentin (NEURONTIN) capsule 300 mg  300 mg Oral QHS Antonietta Breach, PA-C      . hydrOXYzine (ATARAX/VISTARIL) tablet 25 mg  25 mg Oral TID PRN Antonietta Breach, PA-C      . insulin aspart (novoLOG) injection 0-15 Units  0-15 Units Subcutaneous TID WC Lacretia Leigh, MD   5 Units at 08/26/16 1301  . insulin aspart (novoLOG) injection 0-5 Units  0-5 Units Subcutaneous QHS Lacretia Leigh, MD      . Derrill Memo ON 08/27/2016] insulin glargine (LANTUS) injection 35 Units  35 Units Subcutaneous QHS Antonietta Breach, PA-C      . nicotine polacrilex (NICORETTE) gum  2 mg  2 mg Oral PRN Antonietta Breach, PA-C       Current Outpatient Prescriptions  Medication Sig Dispense Refill  . doxepin (SINEQUAN) 25 MG capsule Take 1 capsule (25 mg total) by mouth at bedtime as needed (sleep). 30 capsule 0  . FLUoxetine (PROZAC) 20 MG capsule Take 1 capsule (20 mg total) by mouth daily. 30 capsule 0  . gabapentin (NEURONTIN) 100 MG capsule Take 2 capsules (200 mg total) by mouth 2 (two) times daily. 120 capsule 0  . gabapentin (NEURONTIN) 300 MG capsule Take 1 capsule (300 mg total)  by mouth at bedtime. 30 capsule 0  . hydrOXYzine (ATARAX/VISTARIL) 25 MG tablet Take 1 tablet (25 mg total) by mouth 3 (three) times daily as needed (anxiety, insomnia). 30 tablet 0  . insulin aspart (NOVOLOG) 100 UNIT/ML injection Inject 0-20 Units into the skin 3 (three) times daily before meals. Per sliding scale    . insulin glargine (LANTUS) 100 UNIT/ML injection Inject 35 Units into the skin at bedtime.    . nicotine polacrilex (NICORETTE) 2 MG gum Take 1 each (2 mg total) by mouth as needed for smoking cessation. 100 tablet 0  . acamprosate (CAMPRAL) 333 MG tablet Take 1 tablet (333 mg total) by mouth 3 (three) times daily. (Patient not taking: Reported on 08/26/2016) 30 tablet 0  . Melatonin 3 MG TABS Take 6 mg by mouth at bedtime as needed (sleep).      Musculoskeletal: Strength & Muscle Tone: within normal limits Gait & Station: normal Patient leans: N/A  Psychiatric Specialty Exam: Physical Exam  Constitutional: He is oriented to person, place, and time.  Neck: Normal range of motion.  Respiratory: Effort normal.  Musculoskeletal: Normal range of motion.  Neurological: He is alert and oriented to person, place, and time.  Skin: Skin is warm and dry.    Review of Systems  Constitutional: Negative.   HENT: Negative.   Eyes: Negative.   Respiratory: Negative.   Cardiovascular: Negative.   Gastrointestinal: Negative.   Genitourinary: Negative.   Musculoskeletal: Negative.   Skin: Negative.   Neurological: Negative.   Endo/Heme/Allergies: Negative.   Psychiatric/Behavioral: Positive for depression.    Blood pressure 118/77, pulse 71, temperature 98.2 F (36.8 C), temperature source Oral, resp. rate 16, height _0  (1.803 m), weight 76.2 kg (168 lb), SpO2 99 %.Body mass index is 23.43 kg/m.  General Appearance: Casual  Eye Contact:  Good  Speech:  Clear and Coherent and Normal Rate  Volume:  Normal  Mood:  Depressed  Affect:  Appropriate  Thought Process:  Coherent  and Goal Directed  Orientation:  Full (Time, Place, and Person)  Thought Content:  WDL  Suicidal Thoughts:  No  Homicidal Thoughts:  No  Memory:  Immediate;   Good Recent;   Good Remote;   Good  Judgement:  Fair  Insight:  Present  Psychomotor Activity:  Normal  Concentration:  Concentration: Fair and Attention Span: Fair  Recall:  Good  Fund of Knowledge:  Good  Language:  Good  Akathisia:  No  Handed:  Right  AIMS (if indicated):     Assets:  Communication Skills Desire for Improvement Housing Social Support  ADL's:  Intact  Cognition:  WNL  Sleep:        Treatment Plan Summary: Patient has been stable and willing to seek IOP and contract for safety during this evaluation. He has substance induced mood disorder and relationship problems. His BAL is 25 and has no  symptoms of intoxication or withdrawal.   Plan Discharge home with outpatient resources (Partial Hospitalization and IOP)   Disposition: No evidence of imminent risk to self or others at present.   Patient does not meet criteria for psychiatric inpatient admission.   Discharge home with resources for outpatient services  Earleen Newport, NP 08/26/2016 1:50 PM   Patient seen face to face for this psychiatric evaluation and case discussed with physician extender and treatment team and formulated treatment plan. Reviewed the information documented and agree with the treatment plan. Page Pucciarelli 08/28/2016 12:30 PM

## 2016-08-26 NOTE — ED Provider Notes (Signed)
[facility]-EMERGENCY DEPT Provider Note   CSN: [id number] Arrival date & time: 08/26/16  0122     History   Chief Complaint Chief Complaint  Patient presents with  . Suicidal    HPI Micheal Lopez Current is a 30 y.o. male.  30 year old male with a history of major depressive disorder and alcohol use disorder presents to the emergency department for worsening depression. He reports expressing suicidal ideation to some friends this evening who called the police. Patient brought in by [employer], voluntarily. Patient reports worsening depression and suicidal ideations after drinking alcohol tonight. He states that he drank 2 40-ounce beers. Patient with no suicidal plan. No illicit substance use. He expresses desire to go to a residential rehab facility. He was previously coordinated to go to [facility], but was turned away secondary to IDDM history.   The history is provided by the patient. No language interpreter was used.    Past Medical History:  Diagnosis Date  . Depression    per pt.  . Diabetes mellitus without complication (HCC)    Type I    Patient Active Problem List   Diagnosis Date Noted  . Alcohol abuse with alcohol-induced mood disorder (HCC) 08/23/2016  . Major depressive disorder, recurrent severe without psychotic features (HCC) 07/31/2016  . Alcohol intoxication (HCC)   . Alcohol use disorder, severe, dependence (HCC) 09/21/2015  . Suicidal ideation     No past surgical history on file.    Home Medications    Prior to Admission medications   Medication Sig Start Date End Date Taking? Authorizing Provider  doxepin (SINEQUAN) 25 MG capsule Take 1 capsule (25 mg total) by mouth at bedtime as needed (sleep). 08/24/16  Yes Truman Hayward, FNP  FLUoxetine (PROZAC) 20 MG capsule Take 1 capsule (20 mg total) by mouth daily. 08/24/16  Yes Truman Hayward, FNP  gabapentin (NEURONTIN) 100 MG capsule Take 2 capsules (200 mg total) by mouth 2 (two) times daily. 08/09/16  Yes Adonis Brook, NP   gabapentin (NEURONTIN) 300 MG capsule Take 1 capsule (300 mg total) by mouth at bedtime. 08/09/16  Yes Adonis Brook, NP  hydrOXYzine (ATARAX/VISTARIL) 25 MG tablet Take 1 tablet (25 mg total) by mouth 3 (three) times daily as needed (anxiety, insomnia). 08/24/16  Yes Truman Hayward, FNP  insulin aspart (NOVOLOG) 100 UNIT/ML injection Inject 0-20 Units into the skin 3 (three) times daily before meals. Per sliding scale   Yes Historical Provider, MD  insulin glargine (LANTUS) 100 UNIT/ML injection Inject 35 Units into the skin at bedtime.   Yes Historical Provider, MD  nicotine polacrilex (NICORETTE) 2 MG gum Take 1 each (2 mg total) by mouth as needed for smoking cessation. 08/09/16  Yes Adonis Brook, NP  acamprosate (CAMPRAL) 333 MG tablet Take 1 tablet (333 mg total) by mouth 3 (three) times daily. Patient not taking: Reported on 08/26/2016 08/24/16   Truman Hayward, FNP  Melatonin 3 MG TABS Take 6 mg by mouth at bedtime as needed (sleep).    Historical Provider, MD    Family History Family History  Problem Relation Age of Onset  . Cancer Maternal Grandmother     BREAST     Social History Social History  Substance Use Topics  . Smoking status: Current Every Day Smoker    Packs/day: 1.00    Types: E-cigarettes, Cigarettes  . Smokeless tobacco: Never Used  . Alcohol use Yes     Comment: binge drinking     Allergies   Review  of patient's allergies indicates no known allergies.   Review of Systems Review of Systems  Psychiatric/Behavioral: Positive for behavioral problems and suicidal ideas.  Ten systems reviewed and are negative for acute change, except as noted in the HPI.    Physical Exam Updated Vital Signs BP 121/74 (BP Location: Left Arm)   Pulse 99   Temp 99.2 F (37.3 C) (Oral)   Resp 16   Ht 5\' 11"  (1.803 m)   Wt 76.2 kg   SpO2 96%   BMI 23.43 kg/m   Physical Exam  Constitutional: He is oriented to person, place, and time. He appears well-developed and  well-nourished. No distress.  HENT:  Head: Normocephalic and atraumatic.  Eyes: Conjunctivae and EOM are normal. No scleral icterus.  Neck: Normal range of motion.  Pulmonary/Chest: Effort normal. No respiratory distress.  Musculoskeletal: Normal range of motion.  Neurological: He is alert and oriented to person, place, and time.  Skin: Skin is warm and dry. No rash noted. He is not diaphoretic. No erythema. No pallor.  Psychiatric: His behavior is normal. He exhibits a depressed mood (mild). He expresses no homicidal ideation. He expresses no suicidal plans and no homicidal plans.  Nursing note and vitals reviewed.    ED Treatments / Results  Labs (all labs ordered are listed, but only abnormal results are displayed) Labs Reviewed  COMPREHENSIVE METABOLIC PANEL - Abnormal; Notable for the following:       Result Value   CO2 21 (*)    Glucose, Bld 240 (*)    AST 14 (*)    All other components within normal limits  ETHANOL - Abnormal; Notable for the following:    Alcohol, Ethyl (B) 25 (*)    All other components within normal limits  ACETAMINOPHEN LEVEL - Abnormal; Notable for the following:    Acetaminophen (Tylenol), Serum <10 (*)    All other components within normal limits  SALICYLATE LEVEL  CBC  URINE RAPID DRUG SCREEN, HOSP PERFORMED    EKG  EKG Interpretation None       Radiology No results found.  Procedures Procedures (including critical care time)  Medications Ordered in ED Medications  FLUoxetine (PROZAC) capsule 20 mg (not administered)  hydrOXYzine (ATARAX/VISTARIL) tablet 25 mg (not administered)  insulin aspart (novoLOG) injection 0-20 Units (not administered)  insulin glargine (LANTUS) injection 35 Units (not administered)  nicotine polacrilex (NICORETTE) gum 2 mg (not administered)  gabapentin (NEURONTIN) capsule 300 mg (not administered)  gabapentin (NEURONTIN) capsule 200 mg (not administered)     Initial Impression / Assessment and Plan  / ED Course  I have reviewed the triage vital signs and the nursing notes.  Pertinent labs & imaging results that were available during my care of the patient were reviewed by me and considered in my medical decision making (see chart for details).  Clinical Course    Patient medically cleared. He has been evaluated by TTS who recommend inpatient treatment; no information on accepting facility at this time. Disposition to be set by oncoming ED provider.   Final Clinical Impressions(s) / ED Diagnoses   Final diagnoses:  Substance induced mood disorder (HCC)  Severe episode of recurrent major depressive disorder, without psychotic features [facility](HCC)    New Prescriptions New Prescriptions   No medications on file     Antony MaduraKelly Emilianna Barlowe, PA-C 08/26/16 [id number]    April Palumbo, MD 08/26/16 [phone]

## 2016-08-26 NOTE — ED Notes (Signed)
0754:Informed by Tarri Glennheressa MHT that pt CBG 39 mg/dl [id number]: This nurse in pt room, pt asymptomatic, voices no complaints at this time. Hypoglycemic protocol initiated. OJ provided. 0810: CBG recheck 61 mg/dl- OJ provided [id number]: CBG recheck 131 mg/dl, breakfast tray at bedside.

## 2016-08-26 NOTE — [facility] Suicide Risk Assessment (Cosign Needed)
Suicide Risk Assessment  Discharge Assessment   [facility]   Principal Problem: Major depressive disorder, recurrent severe without psychotic features [facility](HCC) Discharge Diagnoses:  Patient Active Problem List   Diagnosis Date Noted  . Alcohol abuse [F10.10]   . Alcohol abuse with alcohol-induced mood disorder (HCC) [F10.14] 08/23/2016  . Major depressive disorder, recurrent severe without psychotic features (HCC) [F33.2] 07/31/2016  . Alcohol intoxication (HCC) [F10.129]   . Substance induced mood disorder (HCC) [F19.94] 09/22/2015  . Alcohol use disorder, severe, dependence (HCC) [F10.20] 09/21/2015  . Suicidal ideation [R45.851]     Total Time spent with patient: 45 minutes  Musculoskeletal: Strength & Muscle Tone: within normal limits Gait & Station: normal Patient leans: N/A  Psychiatric Specialty Exam:   Blood pressure 118/77, pulse 71, temperature 98.2 F (36.8 C), temperature source Oral, resp. rate 16, height 5\' 11"  (1.803 m), weight 76.2 kg (168 lb), SpO2 99 %.Body mass index is 23.43 kg/m.   General Appearance: Casual  Eye Contact:  Good  Speech:  Clear and Coherent and Normal Rate  Volume:  Normal  Mood:  Depressed  Affect:  Appropriate  Thought Process:  Coherent and Goal Directed  Orientation:  Full (Time, Place, and Person)  Thought Content:  WDL  Suicidal Thoughts:  No  Homicidal Thoughts:  No  Memory:  Immediate;   Good Recent;   Good Remote;   Good  Judgement:  Fair  Insight:  Present  Psychomotor Activity:  Normal  Concentration:  Concentration: Fair and Attention Span: Fair  Recall:  Good  Fund of Knowledge:  Good  Language:  Good  Akathisia:  No  Handed:  Right  AIMS (if indicated):     Assets:  Communication Skills Desire for Improvement Housing Social Support  ADL's:  Intact  Cognition:  WNL  Sleep:       Mental Status Per Nursing Assessment::   On Admission:   Suicidal  ideation  Demographic Factors:   Male and Caucasian  Loss Factors: NA  Historical Factors: Impulsivity  Risk Reduction Factors:   Living with another person, especially a relative, Positive social support and Positive therapeutic relationship  Continued Clinical Symptoms:  Depression and Alcohol Abuse  Cognitive Features That Contribute To Risk:  None    Suicide Risk:  Minimal: No identifiable suicidal ideation.  Patients presenting with no risk factors but with morbid ruminations; may be classified as minimal risk based on the severity of the depressive symptoms    Plan Of Care/Follow-up recommendations:  Activity:  As tolerated Diet:  As tolerated  Amaree Loisel, NP 08/26/2016, 2:11 PM

## 2016-08-26 NOTE — ED Notes (Signed)
Instructed by EDP to hold morning insulin.

## 2016-08-28 LAB — GLUCOSE, CAPILLARY
GLUCOSE-CAPILLARY: 295 mg/dL — AB (ref 65–99)
Glucose-Capillary: 286 mg/dL — ABNORMAL HIGH (ref 65–99)

## 2016-09-10 NOTE — ED Provider Notes (Signed)
[facility]-EMERGENCY DEPT Provider Note   CSN: [id number] Arrival date & time: 07/31/16  0130  By signing my name below, I, Phillis Haggis, attest that this documentation has been prepared under the direction and in the presence of Dione Booze, MD.  Electronically Signed: Phillis Haggis, ED Scribe. 07/31/16. 3:25 AM.     First Provider Contact:  First MD Initiated Contact with Patient 07/31/16 0315     History   Chief Complaint Chief Complaint  Patient presents with  . Suicidal  . Alcohol Problem    HPI Micheal Lopez is a 30 y.o. male.  The history is provided by the patient.  Micheal Lopez is a 30 y.o. Male with a hx of Type I DM, substance induced mood disorder, depression, SI, and alcohol use disorder who presents to the Emergency Department complaining of SI onset one day ago. Pt reports that he began to have SI and has been drinking ever since. He states that he might want to hang himself, but does not have a specific plan. He currently denies SI. Pt states that he drinks "a lot" and smokes "a lot" of marijuana. He reports hx of suicide attempts. His most recent attempts include trying to overdose on insulin and injecting air into his veins in hopes that it would go to his heart. He states that he does not have energy or the desire to do things. He used 15 units of insulin in the waiting room. He states that the amount he needs is dependent on what he eats and what his CBG is at the time. Pt states that he also vapes. He denies hx of withdrawal symptoms. He denies fever, chills, nausea, vomiting, or hallucinations. Pt takes insulin, doxepin, and Prozac daily.     PCP: Dartha Lodge, FNP   Past Medical History:  Diagnosis Date  . Depression    per pt.  . Diabetes mellitus without complication (HCC)    Type I    Patient Active Problem List   Diagnosis Date Noted  . Alcohol abuse   . Alcohol abuse with alcohol-induced mood disorder (HCC) 08/23/2016  . Major depressive disorder,  recurrent severe without psychotic features (HCC) 07/31/2016  . Alcohol intoxication (HCC)   . Substance induced mood disorder (HCC) 09/22/2015  . Alcohol use disorder, severe, dependence (HCC) 09/21/2015  . Suicidal ideation     History reviewed. No pertinent surgical history.     Home Medications    Prior to Admission medications   Medication Sig Start Date End Date Taking? Authorizing Provider  acamprosate (CAMPRAL) 333 MG tablet Take 1 tablet (333 mg total) by mouth 3 (three) times daily. Patient not taking: Reported on 08/26/2016 08/24/16   Truman Hayward, FNP  doxepin (SINEQUAN) 25 MG capsule Take 1 capsule (25 mg total) by mouth at bedtime as needed (sleep). 08/24/16   Truman Hayward, FNP  FLUoxetine (PROZAC) 20 MG capsule Take 1 capsule (20 mg total) by mouth daily. 08/24/16   Truman Hayward, FNP  gabapentin (NEURONTIN) 100 MG capsule Take 2 capsules (200 mg total) by mouth 2 (two) times daily. 08/09/16   Adonis Brook, NP  gabapentin (NEURONTIN) 300 MG capsule Take 1 capsule (300 mg total) by mouth at bedtime. 08/09/16   Adonis Brook, NP  hydrOXYzine (ATARAX/VISTARIL) 25 MG tablet Take 1 tablet (25 mg total) by mouth 3 (three) times daily as needed (anxiety, insomnia). 08/24/16   Truman Hayward, FNP  insulin aspart (NOVOLOG) 100 UNIT/ML injection Inject 0-20 Units into the  skin 3 (three) times daily before meals. Per sliding scale    Historical Provider, MD  insulin glargine (LANTUS) 100 UNIT/ML injection Inject 35 Units into the skin at bedtime.    Historical Provider, MD  Melatonin 3 MG TABS Take 6 mg by mouth at bedtime as needed (sleep).    Historical Provider, MD  nicotine polacrilex (NICORETTE) 2 MG gum Take 1 each (2 mg total) by mouth as needed for smoking cessation. 08/09/16   Adonis Brook, NP    Family History Family History  Problem Relation Age of Onset  . Cancer Maternal Grandmother     BREAST     Social History Social History  Substance Use Topics  . Smoking  status: Current Every Day Smoker    Packs/day: 1.00    Types: E-cigarettes, Cigarettes  . Smokeless tobacco: Never Used  . Alcohol use Yes     Comment: binge drinking     Allergies   Review of patient's allergies indicates no known allergies.   Review of Systems Review of Systems  All other systems reviewed and are negative.    Physical Exam Updated Vital Signs BP 125/71   Pulse 92   Temp 98.6 F (37 C)   Resp 17   SpO2 100%   Physical Exam  Nursing note and vitals reviewed.  Constitutional: He is oriented to person, place, and time. He appears well-developed and well-nourished.  HENT:  Head: Normocephalic and atraumatic.  Eyes: EOM are normal. Pupils are equal, round, and reactive to light.  Neck: Normal range of motion. Neck supple. No JVD present.  Cardiovascular: Normal rate, regular rhythm and normal heart sounds.    No murmur heard.  Pulmonary/Chest: Effort normal and breath sounds normal. He has no wheezes. He has no rales. He exhibits no tenderness.  Abdominal: Soft. He exhibits no distension and no mass. There is no tenderness.  Musculoskeletal: Normal range of motion. He exhibits no edema.  Lymphadenopathy:    He has no cervical adenopathy.  Neurological: He is alert and oriented to person, place, and time. No cranial nerve deficit. He exhibits normal muscle tone. Coordination normal.  Skin: Skin is warm and dry. No rash noted.  Psychiatric: He has a normal mood and affect. His behavior is normal. Judgment and thought content normal.   ED Treatments / Results  Labs (all labs ordered are listed, but only abnormal results are displayed) Labs Reviewed  COMPREHENSIVE METABOLIC PANEL - Abnormal; Notable for the following:       Result Value   Sodium 132 (*)    Chloride 99 (*)    CO2 19 (*)    Glucose, Bld 473 (*)    All other components within normal limits  ETHANOL - Abnormal; Notable for the following:    Alcohol, Ethyl (B) 119 (*)    All other  components within normal limits  ACETAMINOPHEN LEVEL - Abnormal; Notable for the following:    Acetaminophen (Tylenol), Serum <10 (*)    All other components within normal limits  CBC - Abnormal; Notable for the following:    WBC 14.2 (*)    All other components within normal limits  CBG MONITORING, ED - Abnormal; Notable for the following:    Glucose-Capillary 376 (*)    All other components within normal limits  CBG MONITORING, ED - Abnormal; Notable for the following:    Glucose-Capillary 101 (*)    All other components within normal limits  CBG MONITORING, ED - Abnormal; Notable for the  following:    Glucose-Capillary 47 (*)    All other components within normal limits  CBG MONITORING, ED - Abnormal; Notable for the following:    Glucose-Capillary 455 (*)    All other components within normal limits  CBG MONITORING, ED - Abnormal; Notable for the following:    Glucose-Capillary 526 (*)    All other components within normal limits  CBG MONITORING, ED - Abnormal; Notable for the following:    Glucose-Capillary 322 (*)    All other components within normal limits  CBG MONITORING, ED - Abnormal; Notable for the following:    Glucose-Capillary 144 (*)    All other components within normal limits  CBG MONITORING, ED - Abnormal; Notable for the following:    Glucose-Capillary 219 (*)    All other components within normal limits  CBG MONITORING, ED - Abnormal; Notable for the following:    Glucose-Capillary 225 (*)    All other components within normal limits  CBG MONITORING, ED - Abnormal; Notable for the following:    Glucose-Capillary 114 (*)    All other components within normal limits  SALICYLATE LEVEL  URINE RAPID DRUG SCREEN, HOSP PERFORMED  CBG MONITORING, ED  CBG MONITORING, ED    Procedures Procedures (including critical care time)  Medications Ordered in ED Medications  insulin aspart (novoLOG) injection 10 Units (10 Units Subcutaneous Given 07/31/16 0420)    insulin aspart (novoLOG) injection 15 Units (15 Units Subcutaneous Given 07/31/16 1358)     Initial Impression / Assessment and Plan / ED Course  I have reviewed the triage vital signs and the nursing notes.  Pertinent lab results that were available during my care of the patient were reviewed by me and considered in my medical decision making (see chart for details).  Clinical Course   Patient with suicidal ideation. Glucose is moderately elevated and he is given insulin to correct that. Old records are reviewed showing a prior ED visits for suicidal ideation and diabetes. TTS consultation is obtained, and patient does meet inpatient criteria. [facility]  Final Clinical Impressions(s) / ED Diagnoses   Final diagnoses:  Suicidal ideation  Alcohol intoxication, uncomplicated (HCC)  Hyperglycemia  Major depressive disorder, recurrent severe without psychotic features (HCC)  Alcohol use disorder, severe, dependence [facility](HCC)    New Prescriptions Discharge Medication List as of 08/01/2016 11:07 AM       Dione Boozeavid Jaymarion Trombly, MD 09/10/16 2258

## 2017-07-11 ENCOUNTER — Encounter (HOSPITAL_COMMUNITY): Payer: Self-pay | Admitting: Behavioral Health

## 2017-07-11 ENCOUNTER — Inpatient Hospital Stay (HOSPITAL_COMMUNITY)
Admission: RE | Admit: 2017-07-11 | Discharge: 2017-07-14 | DRG: 885 | Disposition: A | Discharge status: Home / Self Care | Payer: 59 | Attending: Psychiatry | Admitting: Psychiatry

## 2017-07-11 DIAGNOSIS — R45850 Homicidal ideations: Secondary | ICD-10-CM | POA: Diagnosis present

## 2017-07-11 DIAGNOSIS — R630 Anorexia: Secondary | ICD-10-CM | POA: Diagnosis present

## 2017-07-11 DIAGNOSIS — F1020 Alcohol dependence, uncomplicated: Secondary | ICD-10-CM

## 2017-07-11 DIAGNOSIS — G479 Sleep disorder, unspecified: Secondary | ICD-10-CM | POA: Diagnosis present

## 2017-07-11 DIAGNOSIS — F1290 Cannabis use, unspecified, uncomplicated: Secondary | ICD-10-CM | POA: Diagnosis present

## 2017-07-11 DIAGNOSIS — F419 Anxiety disorder, unspecified: Secondary | ICD-10-CM | POA: Diagnosis present

## 2017-07-11 DIAGNOSIS — R5383 Other fatigue: Secondary | ICD-10-CM | POA: Diagnosis present

## 2017-07-11 DIAGNOSIS — E109 Type 1 diabetes mellitus without complications: Secondary | ICD-10-CM | POA: Diagnosis present

## 2017-07-11 DIAGNOSIS — F1010 Alcohol abuse, uncomplicated: Secondary | ICD-10-CM | POA: Diagnosis present

## 2017-07-11 DIAGNOSIS — F332 Major depressive disorder, recurrent severe without psychotic features: Secondary | ICD-10-CM | POA: Diagnosis present

## 2017-07-11 DIAGNOSIS — F17290 Nicotine dependence, other tobacco product, uncomplicated: Secondary | ICD-10-CM | POA: Diagnosis present

## 2017-07-11 DIAGNOSIS — F1994 Other psychoactive substance use, unspecified with psychoactive substance-induced mood disorder: Secondary | ICD-10-CM

## 2017-07-11 DIAGNOSIS — R45851 Suicidal ideations: Secondary | ICD-10-CM | POA: Diagnosis present

## 2017-07-11 DIAGNOSIS — Z79899 Other long term (current) drug therapy: Secondary | ICD-10-CM

## 2017-07-11 DIAGNOSIS — F17210 Nicotine dependence, cigarettes, uncomplicated: Secondary | ICD-10-CM | POA: Diagnosis not present

## 2017-07-11 DIAGNOSIS — F4320 Adjustment disorder, unspecified: Secondary | ICD-10-CM | POA: Diagnosis not present

## 2017-07-11 LAB — GLUCOSE, CAPILLARY
GLUCOSE-CAPILLARY: 287 mg/dL — AB (ref 65–99)
GLUCOSE-CAPILLARY: 350 mg/dL — AB (ref 65–99)
GLUCOSE-CAPILLARY: 433 mg/dL — AB (ref 65–99)

## 2017-07-11 LAB — COMPREHENSIVE METABOLIC PANEL
ALBUMIN: 4.5 g/dL (ref 3.5–5.0)
ALT: 24 U/L (ref 17–63)
ANION GAP: 11 (ref 5–15)
AST: 23 U/L (ref 15–41)
Alkaline Phosphatase: 63 U/L (ref 38–126)
BUN: 24 mg/dL — ABNORMAL HIGH (ref 6–20)
CO2: 26 mmol/L (ref 22–32)
Calcium: 9.4 mg/dL (ref 8.9–10.3)
Chloride: 99 mmol/L — ABNORMAL LOW (ref 101–111)
Creatinine, Ser: 1.11 mg/dL (ref 0.61–1.24)
GFR calc Af Amer: >60 mL/min (ref >60)
GFR calc non Af Amer: >60 mL/min (ref >60)
GLUCOSE: 386 mg/dL — AB (ref 65–99)
POTASSIUM: 3.9 mmol/L (ref 3.5–5.1)
SODIUM: 136 mmol/L (ref 135–145)
Total Bilirubin: 0.7 mg/dL (ref 0.3–1.2)
Total Protein: 7.3 g/dL (ref 6.5–8.1)

## 2017-07-11 LAB — CBC
HCT: 45.3 % (ref 39.0–52.0)
HEMOGLOBIN: 15.9 g/dL (ref 13.0–17.0)
MCH: 31.7 pg (ref 26.0–34.0)
MCHC: 35.1 g/dL (ref 30.0–36.0)
MCV: 90.2 fL (ref 78.0–100.0)
Platelets: 229 10*3/uL (ref 150–400)
RBC: 5.02 MIL/uL (ref 4.22–5.81)
RDW: 13 % (ref 11.5–15.5)
WBC: 10.6 10*3/uL — ABNORMAL HIGH (ref 4.0–10.5)

## 2017-07-11 LAB — URINALYSIS, ROUTINE W REFLEX MICROSCOPIC
BILIRUBIN URINE: NEGATIVE
Bacteria, UA: NONE SEEN
Glucose, UA: >=500 mg/dL — AB
HGB URINE DIPSTICK: NEGATIVE
Ketones, ur: 5 mg/dL — AB
Leukocytes, UA: NEGATIVE
Nitrite: NEGATIVE
PROTEIN: NEGATIVE mg/dL
Specific Gravity, Urine: 1.033 — ABNORMAL HIGH (ref 1.005–1.030)
Squamous Epithelial / LPF: NONE SEEN
pH: 6 (ref 5.0–8.0)

## 2017-07-11 LAB — ETHANOL: Alcohol, Ethyl (B): <5 mg/dL (ref <5)

## 2017-07-11 LAB — TSH: TSH: 0.651 u[IU]/mL (ref 0.350–4.500)

## 2017-07-11 MED ORDER — BUSPIRONE HCL 5 MG PO TABS
5.0000 mg | ORAL_TABLET | Freq: Three times a day (TID) | ORAL | Status: DC
Start: 2017-07-11 — End: 2017-07-12
  Administered 2017-07-11 – 2017-07-12 (×3): 5 mg via ORAL
  Filled 2017-07-11 (×6): qty 1

## 2017-07-11 MED ORDER — INSULIN GLARGINE 100 UNIT/ML ~~LOC~~ SOLN
24.0000 [IU] | Freq: Every day | SUBCUTANEOUS | Status: DC
  Administered 2017-07-11 – 2017-07-13 (×3): 24 [IU] via SUBCUTANEOUS

## 2017-07-11 MED ORDER — INSULIN ASPART 100 UNIT/ML ~~LOC~~ SOLN
0.0000 [IU] | Freq: Three times a day (TID) | SUBCUTANEOUS | Status: DC
  Administered 2017-07-11: 4 [IU] via SUBCUTANEOUS
  Administered 2017-07-12: 2 [IU] via SUBCUTANEOUS
  Administered 2017-07-12: 4 [IU] via SUBCUTANEOUS
  Administered 2017-07-12: 1 [IU] via SUBCUTANEOUS
  Administered 2017-07-13: 2 [IU] via SUBCUTANEOUS
  Administered 2017-07-13: 4 [IU] via SUBCUTANEOUS
  Administered 2017-07-13: 3 [IU] via SUBCUTANEOUS
  Administered 2017-07-14: 6 [IU] via SUBCUTANEOUS

## 2017-07-11 MED ORDER — ALUM & MAG HYDROXIDE-SIMETH 200-200-20 MG/5ML PO SUSP
30.0000 mL | ORAL | Status: DC | PRN
  Administered 2017-07-13: 30 mL via ORAL
  Filled 2017-07-11: qty 30

## 2017-07-11 MED ORDER — MAGNESIUM HYDROXIDE 400 MG/5ML PO SUSP: 30.0000 mL | Freq: Every day | ORAL | Status: DC | PRN

## 2017-07-11 MED ORDER — ACETAMINOPHEN 325 MG PO TABS: 650.0000 mg | ORAL_TABLET | Freq: Four times a day (QID) | ORAL | Status: DC | PRN

## 2017-07-11 MED ORDER — INSULIN ASPART 100 UNIT/ML ~~LOC~~ SOLN
0.0000 [IU] | Freq: Once | SUBCUTANEOUS | Status: AC
  Administered 2017-07-11: 3 [IU] via SUBCUTANEOUS

## 2017-07-11 MED ORDER — NICOTINE 21 MG/24HR TD PT24
21.0000 mg | MEDICATED_PATCH | Freq: Every day | TRANSDERMAL | Status: DC
  Filled 2017-07-11 (×3): qty 1

## 2017-07-11 MED ORDER — INSULIN ASPART 100 UNIT/ML ~~LOC~~ SOLN
4.0000 [IU] | Freq: Once | SUBCUTANEOUS | Status: AC
  Administered 2017-07-11: 4 [IU] via SUBCUTANEOUS

## 2017-07-11 MED ORDER — INSULIN ASPART 100 UNIT/ML ~~LOC~~ SOLN: 0.0000 [IU] | Freq: Three times a day (TID) | SUBCUTANEOUS | Status: DC

## 2017-07-11 MED ORDER — INSULIN ASPART 100 UNIT/ML ~~LOC~~ SOLN
8.0000 [IU] | Freq: Every day | SUBCUTANEOUS | Status: DC
  Administered 2017-07-11 – 2017-07-13 (×3): 8 [IU] via SUBCUTANEOUS

## 2017-07-11 MED ORDER — HYDROXYZINE HCL 25 MG PO TABS
25.0000 mg | ORAL_TABLET | Freq: Three times a day (TID) | ORAL | Status: DC | PRN
  Administered 2017-07-11: 25 mg via ORAL
  Filled 2017-07-11: qty 1

## 2017-07-11 MED ORDER — INSULIN ASPART 100 UNIT/ML ~~LOC~~ SOLN
6.0000 [IU] | Freq: Once | SUBCUTANEOUS | Status: AC
  Administered 2017-07-11: 6 [IU] via SUBCUTANEOUS

## 2017-07-11 MED ORDER — FLUOXETINE HCL 20 MG PO CAPS
40.0000 mg | ORAL_CAPSULE | Freq: Every day | ORAL | Status: DC
  Administered 2017-07-11 – 2017-07-14 (×4): 40 mg via ORAL
  Filled 2017-07-11 (×5): qty 2

## 2017-07-11 MED ORDER — INSULIN ASPART 100 UNIT/ML ~~LOC~~ SOLN
6.0000 [IU] | Freq: Two times a day (BID) | SUBCUTANEOUS | Status: DC
  Administered 2017-07-12 – 2017-07-14 (×5): 6 [IU] via SUBCUTANEOUS

## 2017-07-11 MED ORDER — NICOTINE 14 MG/24HR TD PT24
14.0000 mg | MEDICATED_PATCH | Freq: Every day | TRANSDERMAL | Status: DC
  Administered 2017-07-11 – 2017-07-13 (×3): 14 mg via TRANSDERMAL
  Filled 2017-07-11 (×5): qty 1
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
## 2017-07-11 NOTE — Tx Team (Signed)
Interdisciplinary Treatment and Diagnostic Plan Update  07/11/2017 Time of Session: [place] MRN: [id number]  Principal Diagnosis: Major depressive disorder, recurrent severe without psychotic features ([place])  Secondary Diagnoses: Principal Problem:   Major depressive disorder, recurrent severe without psychotic features ([place]) Active Problems:   MDD (major depressive disorder), recurrent severe, without psychosis ([place])   Current Medications:  Current Facility-Administered Medications  Medication Dose Route Frequency Provider Last Rate Last Dose  . acetaminophen (TYLENOL) tablet 650 mg  650 mg Oral Q6H PRN Ethelene Hal, NP      . alum & mag hydroxide-simeth (MAALOX/MYLANTA) 200-200-20 MG/5ML suspension 30 mL  30 mL Oral Q4H PRN Ethelene Hal, NP      . insulin aspart (novoLOG) injection 0-15 Units  0-15 Units Subcutaneous TID WC Ethelene Hal, NP      . magnesium hydroxide (MILK OF MAGNESIA) suspension 30 mL  30 mL Oral Daily PRN Ethelene Hal, NP      . nicotine (NICODERM CQ - dosed in mg/24 hours) patch 21 mg  21 mg Transdermal Daily Ethelene Hal, NP       PTA Medications: Prescriptions Prior to Admission  Medication Sig Dispense Refill Last Dose  . acamprosate (CAMPRAL) 333 MG tablet Take 1 tablet (333 mg total) by mouth 3 (three) times daily. (Patient not taking: Reported on 08/26/2016) 30 tablet 0 prescription has not been filled  . doxepin (SINEQUAN) 25 MG capsule Take 1 capsule (25 mg total) by mouth at bedtime as needed (sleep). 30 capsule 0 3-4 days ago  . FLUoxetine (PROZAC) 20 MG capsule Take 1 capsule (20 mg total) by mouth daily. 30 capsule 0 08/25/2016 at Unknown time  . gabapentin (NEURONTIN) 100 MG capsule Take 2 capsules (200 mg total) by mouth 2 (two) times daily. 120 capsule 0 08/25/2016 at Unknown time  . gabapentin (NEURONTIN) 300 MG capsule Take 1 capsule (300 mg total) by mouth at bedtime. 30 capsule 0 08/24/2016 at Unknown  time  . hydrOXYzine (ATARAX/VISTARIL) 25 MG tablet Take 1 tablet (25 mg total) by mouth 3 (three) times daily as needed (anxiety, insomnia). 30 tablet 0 unknown  . insulin aspart (NOVOLOG) 100 UNIT/ML injection Inject 0-20 Units into the skin 3 (three) times daily before meals. Per sliding scale   08/26/2016 at Unknown time  . insulin glargine (LANTUS) 100 UNIT/ML injection Inject 35 Units into the skin at bedtime.   08/24/2016  . Melatonin 3 MG TABS Take 6 mg by mouth at bedtime as needed (sleep).   3-4 days ago  . nicotine polacrilex (NICORETTE) 2 MG gum Take 1 each (2 mg total) by mouth as needed for smoking cessation. 100 tablet 0 Past Month at Unknown time    Patient Stressors:    Patient Strengths: Average or above average intelligence Communication skills  Treatment Modalities: Medication Management, Group therapy, Case management,  1 to 1 session with clinician, Psychoeducation, Recreational therapy.   Physician Treatment Plan for Primary Diagnosis: Major depressive disorder, recurrent severe without psychotic features ([place]) Long Term Goal(s):     Short Term Goals:    Medication Management: Evaluate patient's response, side effects, and tolerance of medication regimen.  Therapeutic Interventions: 1 to 1 sessions, Unit Group sessions and Medication administration.  Evaluation of Outcomes: Not Met  Physician Treatment Plan for Secondary Diagnosis: Principal Problem:   Major depressive disorder, recurrent severe without psychotic features ([place]) Active Problems:   MDD (major depressive disorder), recurrent severe, without psychosis ([place])  Long Term  Goal(s):     Short Term Goals:       Medication Management: Evaluate patient's response, side effects, and tolerance of medication regimen.  Therapeutic Interventions: 1 to 1 sessions, Unit Group sessions and Medication administration.  Evaluation of Outcomes: Not Met   RN Treatment Plan for Primary Diagnosis: Major depressive  disorder, recurrent severe without psychotic features ([place]) Long Term Goal(s): Knowledge of disease and therapeutic regimen to maintain health will improve  Short Term Goals: Ability to remain free from injury will improve, Ability to disclose and discuss suicidal ideas and Ability to identify and develop effective coping behaviors will improve  Medication Management: RN will administer medications as ordered by provider, will assess and evaluate patient's response and provide education to patient for prescribed medication. RN will report any adverse and/or side effects to prescribing provider.  Therapeutic Interventions: 1 on 1 counseling sessions, Psychoeducation, Medication administration, Evaluate responses to treatment, Monitor vital signs and CBGs as ordered, Perform/monitor CIWA, COWS, AIMS and Fall Risk screenings as ordered, Perform wound care treatments as ordered.  Evaluation of Outcomes: Not Met   LCSW Treatment Plan for Primary Diagnosis: Major depressive disorder, recurrent severe without psychotic features ([place]) Long Term Goal(s): Safe transition to appropriate next level of care at discharge, Engage patient in therapeutic group addressing interpersonal concerns.  Short Term Goals: Engage patient in aftercare planning with referrals and resources, Facilitate patient progression through stages of change regarding substance use diagnoses and concerns and Identify triggers associated with mental health/substance abuse issues  Therapeutic Interventions: Assess for all discharge needs, 1 to 1 time with Social worker, Explore available resources and support systems, Assess for adequacy in community support network, Educate family and significant other(s) on suicide prevention, Complete Psychosocial Assessment, Interpersonal group therapy.  Evaluation of Outcomes: Not Met   Progress in Treatment: Attending groups: No. Participating in groups: No.  Patient new to unit.  Taking  medication as prescribed: Yes. Toleration medication: Yes. Family/Significant other contact made: No, will contact:  family member will be contacted for SPE if pt consents Patient understands diagnosis: Yes. Discussing patient identified problems/goals with staff: Yes. Medical problems stabilized or resolved: Yes. Denies suicidal/homicidal ideation: Yes. Issues/concerns per patient self-inventory: No. Other: n/a  New problem(s) identified: No, Describe:  n/a   New Short Term/Long Term Goal(s): detox, medication management for mood stabilization, elimination of SI thoughts, and development of comprehensive mental wellness/sobriety plan.   Discharge Plan or Barriers: CSW assessing for appropriate referrals. Last admission 08-01-16 where he was referred to [facility], [facility] and [facility].   Reason for Continuation of Hospitalization: Anxiety Depression Medication stabilization Suicidal ideation Withdrawal symptoms  Estimated Length of Stay: Monday, 07-15-17  Attendees: Patient: 07/11/2017 10:36 AM  Physician: Dr. Sanjuana Letters MD;Dr Dwyane Dee MD 07/11/2017 10:36 AM  Nursing: Jinny Sanders RN; Opal Sidles RN 07/11/2017 10:36 AM  RN Care Manager: Lars Pinks CM 07/11/2017 10:36 AM  Social Worker: Maxie Better, LCSW 07/11/2017 10:36 AM  Recreational Therapist: x 07/11/2017 10:36 AM  Other: Lindell Spar NP; Mordecai Maes NP 07/11/2017 10:36 AM  Other:  07/11/2017 10:36 AM  Other: 07/11/2017 10:36 AM    Scribe for Treatment Team: [place], LCSW 07/11/2017 10:36 AM
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
## 2017-07-11 NOTE — BH Assessment (Signed)
Tele Assessment Note   Micheal Lopez is an 31 y.o. male. Pt reports SI with multiple plans. Pt reports HI with a plan to stab his girlfriend in the face with a knife. Pt denies AVH. Per Pt he has been diagnosed with depression, and his depression was exacerbated because of a recent conflict with his girlfriend. The Pt is receiving therapy bi-weekly at the [facility]. The Pt is being prescribed medication by his PCP. The Pt is prescribed Gabbapentin and Prozac. The Pt reports previous hospitalizations. Per Pt his last hospitalization was last year at [facility]. The Pt reports alcohol and marijuana use. Pt denies abuse.   Jacki Cones, NP recommends inpatient treatment. Pt accepted to [facility].  Diagnosis:  F33.2 MDD  Past Medical History:  Past Medical History:  Diagnosis Date  . Depression    per pt.  . Diabetes mellitus without complication (HCC)    Type I    No past surgical history on file.  Family History:  Family History  Problem Relation Age of Onset  . Cancer Maternal Grandmother        BREAST     Social History:  reports that he has been smoking E-cigarettes and Cigarettes.  He has been smoking about 1.00 pack per day. He has never used smokeless tobacco. He reports that he drinks alcohol. He reports that he uses drugs, including Marijuana, about 7 times per week.  Additional Social History:  Alcohol / Drug Use Pain Medications: please see mar Prescriptions: please see mar Over the Counter: please see mar History of alcohol / drug use?: Yes Longest period of sobriety (when/how long): unknown Substance #1 Name of Substance 1: alcohol 1 - Age of First Use: unknown 1 - Amount (size/oz): unknown 1 - Frequency: occasional 1 - Duration: ongoing 1 - Last Use / Amount: "cant remember"  CIWA: CIWA-Ar BP: 113/61 Pulse Rate: 95 COWS:    PATIENT STRENGTHS: (choose at least two) Average or above average intelligence Communication skills  Allergies: No Known Allergies  Home  Medications:  Medications Prior to Admission  Medication Sig Dispense Refill  . acamprosate (CAMPRAL) 333 MG tablet Take 1 tablet (333 mg total) by mouth 3 (three) times daily. (Patient not taking: Reported on 08/26/2016) 30 tablet 0  . doxepin (SINEQUAN) 25 MG capsule Take 1 capsule (25 mg total) by mouth at bedtime as needed (sleep). 30 capsule 0  . FLUoxetine (PROZAC) 20 MG capsule Take 1 capsule (20 mg total) by mouth daily. 30 capsule 0  . gabapentin (NEURONTIN) 100 MG capsule Take 2 capsules (200 mg total) by mouth 2 (two) times daily. 120 capsule 0  . gabapentin (NEURONTIN) 300 MG capsule Take 1 capsule (300 mg total) by mouth at bedtime. 30 capsule 0  . hydrOXYzine (ATARAX/VISTARIL) 25 MG tablet Take 1 tablet (25 mg total) by mouth 3 (three) times daily as needed (anxiety, insomnia). 30 tablet 0  . insulin aspart (NOVOLOG) 100 UNIT/ML injection Inject 0-20 Units into the skin 3 (three) times daily before meals. Per sliding scale    . insulin glargine (LANTUS) 100 UNIT/ML injection Inject 35 Units into the skin at bedtime.    . Melatonin 3 MG TABS Take 6 mg by mouth at bedtime as needed (sleep).    . nicotine polacrilex (NICORETTE) 2 MG gum Take 1 each (2 mg total) by mouth as needed for smoking cessation. 100 tablet 0    OB/GYN Status:  No LMP for male patient.  General Assessment Data Location of Assessment: [facility]  Assessment Services TTS Assessment: In system Is this a Tele or Face-to-Face Assessment?: Face-to-Face Is this an Initial Assessment or a Re-assessment for this encounter?: Initial Assessment Marital status: Single Maiden name: NA Is patient pregnant?: No Pregnancy Status: No Living Arrangements: Parent Can pt return to current living arrangement?: Yes Admission Status: Voluntary Is patient capable of signing voluntary admission?: Yes Referral Source: Self/Family/Friend Insurance type: [employer]  Medical Screening Exam [facility] Walk-in ONLY) Medical Exam completed:  Yes  Crisis Care Plan Living Arrangements: Parent Legal Guardian: Other: (self) Name of Psychiatrist: [facility] [employer] [facility] Name of Therapist: [facility] [employer] [facility]  Education Status Is patient currently in school?: No Current Grade: NA Highest grade of school patient has completed: some college Name of school: NA Contact person: NA  Risk to self with the past 6 months Suicidal Ideation: Yes-Currently Present Has patient been a risk to self within the past 6 months prior to admission? : No Suicidal Intent: Yes-Currently Present Has patient had any suicidal intent within the past 6 months prior to admission? : No Is patient at risk for suicide?: Yes Suicidal Plan?: Yes-Currently Present Has patient had any suicidal plan within the past 6 months prior to admission? : No Specify Current Suicidal Plan: multiple plans Access to Means: Yes Specify Access to Suicidal Means: access to pills What has been your use of drugs/alcohol within the last 12 months?: marijuana and alcohol Previous Attempts/Gestures: Yes How many times?: 2 Other Self Harm Risks: na Triggers for Past Attempts: Unpredictable Intentional Self Injurious Behavior: None Family Suicide History: No Recent stressful life event(s): Conflict (Comment) Persecutory voices/beliefs?: No Depression: Yes Depression Symptoms: Despondent, Insomnia, Tearfulness, Isolating, Fatigue, Guilt, Loss of interest in usual pleasures, Feeling worthless/self pity, Feeling angry/irritable Substance abuse history and/or treatment for substance abuse?: Yes Suicide prevention information given to non-admitted patients: Not applicable  Risk to Others within the past 6 months Homicidal Ideation: Yes-Currently Present Does patient have any lifetime risk of violence toward others beyond the six months prior to admission? : No Thoughts of Harm to Others: Yes-Currently Present Comment - Thoughts of Harm to Others: Pt states he has thoughts of harming his  girlfriend Current Homicidal Intent: Yes-Currently Present Current Homicidal Plan: Yes-Currently Present Describe Current Homicidal Plan: stabbing girlfriend in the face Access to Homicidal Means: Yes Describe Access to Homicidal Means: access to knives Identified Victim: Pt's girlfriend History of harm to others?: No Assessment of Violence: On admission Violent Behavior Description: NA Does patient have access to weapons?: Yes (Comment) Criminal Charges Pending?: No Does patient have a court date: No Is patient on probation?: No  Psychosis Hallucinations: None noted Delusions: None noted  Mental Status Report Appearance/Hygiene: Unremarkable Eye Contact: Fair Motor Activity: Freedom of movement Speech: Logical/coherent Level of Consciousness: Alert Mood: Depressed Affect: Depressed Anxiety Level: Minimal Thought Processes: Coherent, Relevant Judgement: Unimpaired Orientation: Person, Place, Time, Situation, Appropriate for developmental age Obsessive Compulsive Thoughts/Behaviors: None  Cognitive Functioning Concentration: Normal Memory: Recent Intact, Remote Intact IQ: Average Insight: Poor Impulse Control: Poor Appetite: Fair Weight Loss: 0 Weight Gain: 0 Sleep: Decreased Total Hours of Sleep: 5 Vegetative Symptoms: None  ADLScreening [facility] Assessment Services) Patient's cognitive ability adequate to safely complete daily activities?: Yes Patient able to express need for assistance with ADLs?: Yes Independently performs ADLs?: Yes (appropriate for developmental age)  Prior Inpatient Therapy Prior Inpatient Therapy: Yes Prior Therapy Dates: 2017 [facility] for Treatment: depression  Prior Outpatient Therapy Prior Outpatient Therapy: Yes Prior Therapy Dates: current Prior Therapy  Facilty/Provider(s): [facility] Reason for Treatment: depression Does patient have an ACCT team?: No Does patient have Intensive  In-House Services?  : No Does patient have [facility] services? : No Does patient have P4CC services?: No  ADL Screening (condition at time of admission) Patient's cognitive ability adequate to safely complete daily activities?: Yes Is the patient deaf or have difficulty hearing?: No Does the patient have difficulty seeing, even when wearing glasses/contacts?: No Does the patient have difficulty concentrating, remembering, or making decisions?: No Patient able to express need for assistance with ADLs?: Yes Does the patient have difficulty dressing or bathing?: No Independently performs ADLs?: Yes (appropriate for developmental age) Does the patient have difficulty walking or climbing stairs?: No Weakness of Legs: None Weakness of Arms/Hands: None       Abuse/Neglect Assessment (Assessment to be complete while patient is alone) Physical Abuse: Denies Verbal Abuse: Denies Sexual Abuse: Denies Exploitation of patient/patient's resources: Denies Self-Neglect: Denies     Merchant navy officer (For Healthcare) Does Patient Have a Medical Advance Directive?: No    Additional Information 1:1 In Past 12 Months?: No CIRT Risk: No Elopement Risk: No Does patient have medical clearance?: Yes     Disposition:  Disposition Initial Assessment Completed for this Encounter: Yes Disposition of Patient: Inpatient treatment program Type of inpatient treatment program: Adult  Bettylou Frew D 07/11/2017 8:46 AM

## 2017-07-11 NOTE — H&P (Signed)
Psychiatric Admission Assessment Adult  Patient Identification: Micheal Lopez MRN:  [id number] Date of Evaluation:  07/11/2017 Chief Complaint:  MDD Principal Diagnosis: Major depressive disorder, recurrent severe without psychotic features (HCC) Diagnosis:   Patient Active Problem List   Diagnosis Date Noted  . MDD (major depressive disorder), recurrent severe, without psychosis (HCC) [F33.2] 07/11/2017  . Alcohol abuse [F10.10]   . Alcohol abuse with alcohol-induced mood disorder (HCC) [F10.14] 08/23/2016  . Major depressive disorder, recurrent severe without psychotic features (HCC) [F33.2] 07/31/2016  . Alcohol intoxication (HCC) [F10.929]   . Substance induced mood disorder (HCC) [F19.94] 09/22/2015  . Alcohol use disorder, severe, dependence (HCC) [F10.20] 09/21/2015  . Suicidal ideation [R45.851]    History of Present Illness: 31 year old male admitted to [facility] for suicidal ideation with no direct plan and homicidal ideas without intent or plan. Patient acknowledges his reason for admission. He endorses he found out some bad new regarding his girlfriend around 3:00 am this morning and it caused him to have the thoughts as mentioned above. He endorses that after he learned about the information, he rode around time and brought himself to the hospital. Although he denies any specific plan, as per assessment note, patient admitted that he had multiple plans to hurt himself as well as a plan to stab his girlfriend in the face with a knife. Patient denies wanting to hurt his girlfriend at all although he currently endorses HI towards, " the one she was with."   Patient endorses a psychiatric hx of depression and anxiety. He endorses multiple SA in the past with the last attempt occurring 1-2 months ago. He endorses at that time, he attempted to strangle himself. He endorses intermittent SI when becoming, " overwhelmed." Describes depressive symptoms as low mood. Low energy, decreased appetite, and  changes in sleeping pattern. Reports a substance abuse history that include ETOH and THC. He reports drinking alcohol occasionally however endorses smoking marijuana several days per week. He denies withdrawal symptoms at this time. [facility] for his substance use. Reports he has experimented with other drugs in thepast yet does not provided details and reports no recent use.Endorses that he does struggle with controlling his anger. Reports when he becomes upset he throws things, yells, screams. He denies AVH, paranoia, delusions or other psychotic process. Denies any history of cutting or self-injurious behaviors. Reports one prior admission to [facility] 1 year ago. Reports receiving out patient therapy bi-weekly at the [facility] for his mental health needs. Reports current psychiatric medications as Prozac and Gabapentin.Reports medications are prescribed by his PCP Ephriam Knuckles at [facility]. Reports no recent medication dose changes. Reports he does not take the Neurontin consistently as he feels as though it is not effective for his anxiety. Reports using Lamictal in the past which was not effective so the medication was discontinued. Patient denies any legal history. He reports family psychiatric history as unknown.  Patient endorsed current stressors as relationship in general although he does not provide other details bedside current relationship issues with his girlfriend. He endorses that he lives with his mother and their relationship is well. Reports that he has a son and has never been married. Reports that he has been smoking E-cigarettes and Cigarettes at least a pack per day.  Patients medical history is remarkable for Type I Diabetes and he reports he is currently on Insulin (huamalog ) and Tresiba insulin.    Associated Signs/Symptoms:  Depression Symptoms:  depressed mood, fatigue, suicidal thoughts without  plan, anxiety, loss of energy/fatigue, disturbed sleep, decreased appetite, homicdal thoughts (Hypo) Manic Symptoms:  none  Anxiety Symptoms:  Excessive Worry, Psychotic Symptoms:  denies PTSD Symptoms: NA Total Time spent with patient: 1 hour  Past Psychiatric History: Depression and anxiety. Multiple SA in the past. Substance abuse history that include ETOH and THC. [facility] for his substance use. One prior admission to [facility] 1 year ago. Receiving out patient therapy bi-weekly at the [facility] for his mental health needs. Current psychiatric medications are Prozac 20 mg and Gabapentin. Medications are prescribed by his PCP Ephriam Knuckles at [facility]. Reports he does not take the Neurontin consistently as he feels as though it is not effective for his anxiety. Reports using Lamictal in the past which was not effective.   Is the patient at risk to self? Yes.    Has the patient been a risk to self in the past 6 months? Yes.    Has the patient been a risk to self within the distant past? Yes.    Is the patient a risk to others? Yes.    Has the patient been a risk to others in the past 6 months? No.  Has the patient been a risk to others within the distant past? No.   Prior Inpatient Therapy: Prior Inpatient Therapy: Yes Prior Therapy Dates: 2017 Prior Therapy Facilty/Provider(s): [facility] Reason for Treatment: depression Prior Outpatient Therapy: Prior Outpatient Therapy: Yes Prior Therapy Dates: current Prior Therapy Facilty/Provider(s): [facility] Reason for Treatment: depression Does patient have an ACCT team?: No Does patient have Intensive In-House Services?  : No Does patient have [facility] services? : No Does patient have P4CC services?: No  Alcohol Screening:   Substance Abuse History in the last 12 months:  Yes.   Consequences of Substance Abuse: NA Previous Psychotropic Medications: Yes  Psychological  Evaluations: No  Past Medical History:  Past Medical History:  Diagnosis Date  . Depression    per pt.  . Diabetes mellitus without complication (HCC)    Type I   History reviewed. No pertinent surgical history. Family History:  Family History  Problem Relation Age of Onset  . Cancer Maternal Grandmother        BREAST    Family Psychiatric  History: Reports family psychiatric history as unknown.  Tobacco Screening:   Social History:  History  Alcohol Use  . Yes    Comment: binge drinking     History  Drug Use  . Frequency: 7.0 times per week  . Types: Marijuana    Additional Social History: Marital status: Single    Pain Medications: please see mar Prescriptions: please see mar Over the Counter: please see mar History of alcohol / drug use?: Yes Longest period of sobriety (when/how long): unknown Name of Substance 1: alcohol 1 - Age of First Use: unknown 1 - Amount (size/oz): unknown 1 - Frequency: occasional 1 - Duration: ongoing 1 - Last Use / Amount: "cant remember"    Allergies:  No Known Allergies Lab Results: No results found for this or any previous visit (from the past 48 hour(s)).  Blood Alcohol level:  Lab Results  Component Value Date   ETH 25 (H) 08/26/2016   ETH 99 (H) 08/23/2016    Metabolic Disorder Labs:  Lab Results  Component Value Date   HGBA1C 8.5 (H) 09/23/2015   MPG 197 09/23/2015  No results found for: PROLACTIN Lab Results  Component Value Date   CHOL 146 10/17/2015   TRIG 87 10/17/2015   HDL 68 10/17/2015   CHOLHDL 2.1 10/17/2015   VLDL 17 10/17/2015   LDLCALC 61 10/17/2015   LDLCALC 73 09/23/2015    Current Medications: Current Facility-Administered Medications  Medication Dose Route Frequency Provider Last Rate Last Dose  . acetaminophen (TYLENOL) tablet 650 mg  650 mg Oral Q6H PRN Laveda AbbeParks, Laurie Britton, NP      . alum & mag hydroxide-simeth (MAALOX/MYLANTA) 200-200-20 MG/5ML suspension 30 mL  30 mL Oral Q4H PRN  Laveda AbbeParks, Laurie Britton, NP      . insulin aspart (novoLOG) injection 0-15 Units  0-15 Units Subcutaneous TID WC Laveda AbbeParks, Laurie Britton, NP      . magnesium hydroxide (MILK OF MAGNESIA) suspension 30 mL  30 mL Oral Daily PRN Laveda AbbeParks, Laurie Britton, NP      . nicotine (NICODERM CQ - dosed in mg/24 hours) patch 21 mg  21 mg Transdermal Daily Laveda AbbeParks, Laurie Britton, NP       PTA Medications: Prescriptions Prior to Admission  Medication Sig Dispense Refill Last Dose  . acamprosate (CAMPRAL) 333 MG tablet Take 1 tablet (333 mg total) by mouth 3 (three) times daily. (Patient not taking: Reported on 08/26/2016) 30 tablet 0 prescription has not been filled  . doxepin (SINEQUAN) 25 MG capsule Take 1 capsule (25 mg total) by mouth at bedtime as needed (sleep). 30 capsule 0 3-4 days ago  . FLUoxetine (PROZAC) 20 MG capsule Take 1 capsule (20 mg total) by mouth daily. 30 capsule 0 08/25/2016 at Unknown time  . gabapentin (NEURONTIN) 100 MG capsule Take 2 capsules (200 mg total) by mouth 2 (two) times daily. 120 capsule 0 08/25/2016 at Unknown time  . gabapentin (NEURONTIN) 300 MG capsule Take 1 capsule (300 mg total) by mouth at bedtime. 30 capsule 0 08/24/2016 at Unknown time  . hydrOXYzine (ATARAX/VISTARIL) 25 MG tablet Take 1 tablet (25 mg total) by mouth 3 (three) times daily as needed (anxiety, insomnia). 30 tablet 0 unknown  . insulin aspart (NOVOLOG) 100 UNIT/ML injection Inject 0-20 Units into the skin 3 (three) times daily before meals. Per sliding scale   08/26/2016 at Unknown time  . insulin glargine (LANTUS) 100 UNIT/ML injection Inject 35 Units into the skin at bedtime.   08/24/2016  . Melatonin 3 MG TABS Take 6 mg by mouth at bedtime as needed (sleep).   3-4 days ago  . nicotine polacrilex (NICORETTE) 2 MG gum Take 1 each (2 mg total) by mouth as needed for smoking cessation. 100 tablet 0 Past Month at Unknown time    Musculoskeletal: Strength & Muscle Tone: within normal limits Gait & Station:  normal Patient leans: N/A  Psychiatric Specialty Exam: Physical Exam  Nursing note and vitals reviewed. Constitutional: He is oriented to person, place, and time.  Neurological: He is alert and oriented to person, place, and time.    Review of Systems  Psychiatric/Behavioral: Positive for depression, substance abuse (Hx of substnace abuse ) and suicidal ideas. Negative for hallucinations. The patient is nervous/anxious and has insomnia.   All other systems reviewed and are negative.   Blood pressure 129/73, pulse (!) 105, temperature 99 F (37.2 C), temperature source Oral, resp. rate 16, height 5' 11.25" (1.81 m), weight 166 lb 12 oz (75.6 kg), SpO2 100 %.Body mass index is 23.09 kg/m.  General Appearance: Fairly Groomed  Eye Contact:  Good  Speech:  Clear and Coherent and Normal Rate  Volume:  Normal  Mood:  Anxious and Depressed  Affect:  Appropriate  Thought Process:  Coherent, Linear and Descriptions of Associations: Intact  Orientation:  Full (Time, Place, and Person)  Thought Content:  Logical denies AVH, no ruminations, no preoccupations   Suicidal Thoughts:  Yes.  without intent/plan  Homicidal Thoughts:  Yes.  with intent/plan  Memory:  Immediate;   Fair Recent;   Fair  Judgement:  Fair  Insight:  Fair  Psychomotor Activity:  Normal  Concentration:  Concentration: Fair and Attention Span: Fair  Recall:  [employer] of Knowledge:  Fair  Language:  Good  Akathisia:  Negative  Handed:  Right  AIMS (if indicated):     Assets:  Desire for Improvement Resilience  ADL's:  Intact  Cognition:  WNL  Sleep:       Treatment Plan Summary: Daily contact with patient to assess and evaluate symptoms and progress in treatment  Treatment Plan/Recommendations: 1. Admit for crisis management and stabilization, estimated length of stay 3-5 days.  2. Medication management to reduce current symptoms to base line and improve the patient's overall level of functioning: Patient  endorses that Prozac is effective although he would like to start Wellbutrin for long term smoking cessation. Discussed this with MD as well as patient. Advised patient that Wellbutrin can increase the risk of seizures with alcohol use as well as increase anxiety. Discussed with patient that due to his history of anxiety and ETOH use, Wellbutrin would not be a good choice. At this time, will increase Prozac to 40 mg po daily for depression management and anxiety and add Buspar 5 mg po TID for further anxiety management. Patient receptive to plan. Patient also endorsed that he would like a nicotine patch although he heard that it may cause, " bad dreams." He was willing to try patch at a lower dose so 14 mg nicotine patch will be initiated. Will resume home medications for medical conditions as noted in [facility]  3. Treat health problems as indicated.  4. Develop treatment plan to decrease risk of relapse upon discharge and the need for readmission.  5. Psycho-social education regarding relapse prevention and self care.  6. Health care follow up as needed for medical problems.  7. Review, reconcile, and reinstate any pertinent home medications for other health issues where appropriate. 8. Call for consults with hospitalist for any additional specialty patient care services as needed. 9. Begin Clonidine detox protocol for withdrawal symptoms.   Observation Level/Precautions:  15 minute checks  Laboratory:  Ordered UDS, Ethanol. Prolactin, TSH, HgbA1c, CMP, CBC pending.   Psychotherapy:  Group milieu   Medications:  See MAR  Consultations:  As needed.  Discharge Concerns:  Mood stability, maintaining sobriety & safety  Estimated LOS:2-4 days.  Other:  Admit to the 300-hall.     Physician Treatment Plan for Primary Diagnosis: Major depressive disorder, recurrent severe without psychotic features (HCC) Long Term Goal(s): Improvement in symptoms so as ready for discharge  Short Term Goals: Ability to  identify changes in lifestyle to reduce recurrence of condition will improve, Ability to verbalize feelings will improve, Compliance with prescribed medications will improve and Ability to identify triggers associated with substance abuse/mental health issues will improve  Physician Treatment Plan for Secondary Diagnosis: Principal Problem:   Major depressive disorder, recurrent severe without psychotic features (HCC) Active Problems:   MDD (major depressive disorder), recurrent severe, without psychosis (HCC)  Long Term Goal(s):  Improvement in symptoms so as ready for discharge  Short Term Goals: Ability to disclose and discuss suicidal ideas and Ability to identify and develop effective coping behaviors will improve  I certify that inpatient services furnished can reasonably be expected to improve the patient's condition.    Denzil Magnuson, NP 7/19/201810:16 AM

## 2017-07-11 NOTE — Tx Team (Signed)
Initial Treatment Plan 07/11/2017 11:07 AM Micheal Lopez ZOX:[id number]    PATIENT STRESSORS: Substance abuse   PATIENT STRENGTHS: Ability for insight Financial means   PATIENT IDENTIFIED PROBLEMS: Substance abuse  anxiety  depression                 DISCHARGE CRITERIA:  Ability to meet basic life and health needs Improved stabilization in mood, thinking, and/or behavior Motivation to continue treatment in a less acute level of care  PRELIMINARY DISCHARGE PLAN: Attend aftercare/continuing care group  PATIENT/FAMILY INVOLVEMENT: This treatment plan has been presented to and reviewed with the patient, Micheal Lopez, and/or family member. patient and family have been given the opportunity to ask questions and make suggestions.  Bethann PunchesJane O Esraa Seres, RN 07/11/2017, 11:07 AM
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
## 2017-07-11 NOTE — H&P (Signed)
Behavioral Health Medical Screening Exam  Micheal Lopez is an 31 y.o. male who [facility], as a [facility] in, with racing thoughts, poor sleep, multiple plans of suicide and homicidal ideation towards his girlfriend.   Total Time spent with patient: 20 minutes  Psychiatric Specialty Exam: Physical Exam  Constitutional: He is oriented to person, place, and time. He appears well-developed and well-nourished.  HENT:  Head: Normocephalic.  Right Ear: External ear normal.  Left Ear: External ear normal.  Nose: Nose normal.  Neck: Normal range of motion.  Cardiovascular: Normal rate, normal heart sounds and intact distal pulses.   Respiratory: Effort normal and breath sounds normal.  GI: Soft. Bowel sounds are normal.  Musculoskeletal: Normal range of motion.  Neurological: He is alert and oriented to person, place, and time.  Skin: Skin is warm and dry.  Psychiatric: His behavior is normal. His mood appears anxious. His speech is rapid and/or pressured. Cognition and memory are normal. He expresses impulsivity. He expresses homicidal and suicidal ideation. He expresses suicidal plans (multiple plans) and homicidal plans (stated he has thought about stabbing his girlfriend in the face).    Review of Systems  Psychiatric/Behavioral: Positive for depression, substance abuse (marijuana and occasional ETOH) and suicidal ideas. Negative for hallucinations and memory loss. The patient is nervous/anxious and has insomnia.     Blood pressure 113/61, pulse 95, temperature 98.7 F (37.1 C), temperature source Oral, resp. rate 16, SpO2 100 %.There is no height or weight on file to calculate BMI.  General Appearance: Casual and Fairly Groomed  Eye Contact:  Good  Speech:  Pressured  Volume:  Normal  Mood:  Anxious and Depressed  Affect:  Congruent  Thought Process:  Coherent  Orientation:  Full (Time, Place, and Person)  Thought Content:  Ideas of Reference:   Paranoia, Rumination and Tangential   Suicidal Thoughts:  Yes.  with intent/plan, has multiple plans  Homicidal Thoughts:  Yes.  with intent/plan, has thoughts to stab his girlfriend in the face  Memory:  Immediate;   Good Recent;   Fair Remote;   Fair  Judgement:  Fair  Insight:  Fair  Psychomotor Activity:  Normal  Concentration: Concentration: Good and Attention Span: Good  Recall:  Good  Fund of Knowledge:Good  Language: Good  Akathisia:  No  Handed:  Right  AIMS (if indicated):     Assets:  Communication Skills Desire for Improvement Financial Resources/Insurance Housing Physical Health Resilience Social Support Transportation Vocational/Educational  Sleep:       Musculoskeletal: Strength & Muscle Tone: within normal limits Gait & Station: normal Patient leans: N/A  Blood pressure 113/61, pulse 95, temperature 98.7 F (37.1 C), temperature source Oral, resp. rate 16, SpO2 100 %.  Recommendations:  Based on my evaluation the patient does not appear to have an emergency medical condition. [facility] 305-2  Laveda AbbeLaurie Britton Sahian Kerney, NP 07/11/2017, 8:29 AM

## 2017-07-12 DIAGNOSIS — F1994 Other psychoactive substance use, unspecified with psychoactive substance-induced mood disorder: Secondary | ICD-10-CM

## 2017-07-12 DIAGNOSIS — F4320 Adjustment disorder, unspecified: Secondary | ICD-10-CM

## 2017-07-12 DIAGNOSIS — F1010 Alcohol abuse, uncomplicated: Secondary | ICD-10-CM

## 2017-07-12 DIAGNOSIS — F17210 Nicotine dependence, cigarettes, uncomplicated: Secondary | ICD-10-CM

## 2017-07-12 DIAGNOSIS — F332 Major depressive disorder, recurrent severe without psychotic features: Principal | ICD-10-CM

## 2017-07-12 DIAGNOSIS — F1290 Cannabis use, unspecified, uncomplicated: Secondary | ICD-10-CM

## 2017-07-12 LAB — GLUCOSE, CAPILLARY
GLUCOSE-CAPILLARY: 177 mg/dL — AB (ref 65–99)
Glucose-Capillary: 211 mg/dL — ABNORMAL HIGH (ref 65–99)
Glucose-Capillary: 344 mg/dL — ABNORMAL HIGH (ref 65–99)
Glucose-Capillary: 166 mg/dL — ABNORMAL HIGH (ref 65–99)

## 2017-07-12 MED ORDER — HYDROXYZINE HCL 10 MG PO TABS
10.0000 mg | ORAL_TABLET | Freq: Three times a day (TID) | ORAL | Status: DC | PRN
  Administered 2017-07-12 – 2017-07-13 (×2): 10 mg via ORAL
  Filled 2017-07-12: qty 1
  Filled 2017-07-12: qty 10
  Filled 2017-07-12: qty 1

## 2017-07-12 NOTE — Progress Notes (Signed)
Recreation Therapy Notes  Date: 07/12/2017 Time: 9:30am Location: 300 Hall Dayroom  Group Topic: Stress Management  Goal Area(s) Addresses:  Patient will verbalize importance of using healthy stress management.  Patient will identify positive emotions associated with healthy stress management.   Intervention: Stress Management  Activity : Relaxation Visualization. Recreation Therapy Intern introduced the stress management technique of visualization. Recreation Therapy Intern read a script that allowed patients to take mental trip to a wildlife sanctuary. Recreation Therapy Intern played nature music. Patients were to follow along as script was read to engage in the activity.  Education: Stress Management, Discharge Planning.   Education Outcome: Acknowledges edcuation  Clinical Observations/Feedback: Pt did not attend group.  Rachel Meyer, Recreation Therapy Intern  Micheal Lopez, LRT/CTRS 

## 2017-07-12 NOTE — [facility] Group Notes (Signed)
[facility] LCSW Group Therapy  07/12/2017 1:21 PM  Type of Therapy:  Group Therapy  Participation Level:  Active  Participation Quality:  Attentive  Affect:  Appropriate  Cognitive:  Alert and Oriented  Insight:  Engaged  Engagement in Therapy:  Engaged  Modes of Intervention:  Confrontation, Discussion, Education, Problem-solving, Socialization and Support  Summary of Progress/Problems: Feelings around Relapse. Group members discussed the meaning of relapse and shared personal stories of relapse, how it affected them and others, and how they perceived themselves during this time. Group members were encouraged to identify triggers, warning signs and coping skills used when facing the possibility of relapse. Social supports were discussed and explored in detail. Post Acute Withdrawal Syndrome (handout provided) was introduced and examined. Pt's were encouraged to ask questions, talk about key points associated with PAWS, and process this information in terms of relapse prevention.    Dennise Bamber N Smart LCSW 07/12/2017, 1:21 PM
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
## 2017-07-12 NOTE — Progress Notes (Signed)
[facility] MD Progress Note  07/12/2017 12:19 PM Micheal Lopez  MRN:  [id number] Subjective:   31 yo Caucasian male, divorced, lives with his mother, employed. Known history of SUD, MDD, IDDM and head injury. Self presented for voluntary admission. Recently found out that his girlfriend was having an affair. Felt homicidal towards her. Had thoughts suicide.   Chart reviewed today. Patient discussed at team today.   Staff reports that he has been pleasant. No behavioral issues. He has not voiced any suicidal or homicidal thoughts. He has been participating well with unit groups and activities. SW spoke to his mom. She has been in contact with the patient. Mom feels that he is coping well with the break up. His mom hopes he would be back home this weekend as his son would be coming. No concerns about suicidality or homicidality.   Seen today. States that he has been coping well. He reads and keeps his mind busy. Able to focus when he reads. Still thinks about his past relationship. Says he is trying to problem solve on how he would handle the situation when they eventually run into each other. He still has anger and bitterness. No longer having any thoughts of violence. Denies any homicidal thought. Denies any suicidal thoughts. Says he knows they might argue. Patient is processing ways of keeping self under control. We processed ways he could deal with it so as not to come across as threatening. Patient is optimistic he would handle it well. Says he is focused on getting back to work and getting on with his life. Patient reports excessive sedation for 25 mg of vistaril. We have cut it down to 10 mg. We have agreed to discontinue Buspirone as there is no indication for use at this time.   Principal Problem: Major depressive disorder, recurrent severe without psychotic features ([place]) Diagnosis:   Patient Active Problem List   Diagnosis Date Noted  . MDD (major depressive disorder), recurrent severe, without  psychosis ([place]) [F33.2] 07/11/2017  . Alcohol abuse [F10.10]   . Alcohol abuse with alcohol-induced mood disorder ([place]) [F10.14] 08/23/2016  . Major depressive disorder, recurrent severe without psychotic features ([place]) [F33.2] 07/31/2016  . Alcohol intoxication ([place]) [F10.929]   . Substance induced mood disorder ([place]) [F19.94] 09/22/2015  . Alcohol use disorder, severe, dependence ([place]) [F10.20] 09/21/2015  . Suicidal ideation [R45.851]    Total Time spent with patient: 20 minutes  Past Psychiatric History: As in H&P  Past Medical History:  Past Medical History:  Diagnosis Date  . Depression    per pt.  . Diabetes mellitus without complication (HCC)    Type I   History reviewed. No pertinent surgical history. Family History:  Family History  Problem Relation Age of [place]  . Cancer Maternal Grandmother        BREAST    Family Psychiatric  History: As in H&P Social History:  History  Alcohol Use  . Yes    Comment: binge drinking     History  Drug Use  . Frequency: 7.0 times per week  . Types: Marijuana    Social History   Social History  . Marital status: Legally Separated    Spouse name: N/A  . Number of children: N/A  . Years of education: N/A   Social History Main Topics  . Smoking status: Current Every Day Smoker    Packs/day: 1.00    Types: E-cigarettes, Cigarettes  . Smokeless tobacco: Never Used  . Alcohol use Yes  Comment: binge drinking  . Drug use: Yes    Frequency: 7.0 times per week    Types: Marijuana  . Sexual activity: No   Other Topics Concern  . None   Social History Narrative  . None   Additional Social History:    Pain Medications: please see mar Prescriptions: please see mar Over the Counter: please see mar History of alcohol / drug use?: Yes Longest period of sobriety (when/how long): unknown Negative Consequences of Use: Personal relationships Name of Substance 1: alcohol 1 - Age of First Use: unknown 1 - Amount  (size/oz): unknown 1 - Frequency: occasional 1 - Duration: ongoing 1 - Last Use / Amount: "cant remember"                  Sleep: Good  Appetite:  Good  Current Medications: Current Facility-Administered Medications  Medication Dose Route Frequency Provider Last Rate Last Dose  . acetaminophen (TYLENOL) tablet 650 mg  650 mg Oral Q6H PRN Ethelene Hal, NP      . alum & mag hydroxide-simeth (MAALOX/MYLANTA) 200-200-20 MG/5ML suspension 30 mL  30 mL Oral Q4H PRN Ethelene Hal, NP      . FLUoxetine (PROZAC) capsule 40 mg  40 mg Oral Daily Mordecai Maes, NP   40 mg at 07/12/17 1015  . hydrOXYzine (ATARAX/VISTARIL) tablet 10 mg  10 mg Oral TID PRN Artist Beach, MD      . insulin aspart (novoLOG) injection 0-6 Units  0-6 Units Subcutaneous TID WC , Laruth Bouchard, MD   1 Units at 07/12/17 [phone]  . insulin aspart (novoLOG) injection 6 Units  6 Units Subcutaneous BID WC , Laruth Bouchard, MD   6 Units at 07/12/17 [phone]   And  . insulin aspart (novoLOG) injection 8 Units  8 Units Subcutaneous QAC supper Artist Beach, MD   8 Units at 07/11/17 1737  . insulin glargine (LANTUS) injection 24 Units  24 Units Subcutaneous QHS Artist Beach, MD   24 Units at 07/11/17 2221  . magnesium hydroxide (MILK OF MAGNESIA) suspension 30 mL  30 mL Oral Daily PRN Ethelene Hal, NP      . nicotine (NICODERM CQ - dosed in mg/24 hours) patch 14 mg  14 mg Transdermal Daily Mordecai Maes, NP   14 mg at 07/12/17 1015    Lab Results:  Results for orders placed or performed during the hospital encounter of 07/11/17 (from the past 48 hour(s))  Urinalysis, Routine w reflex microscopic     Status: Abnormal   Collection Time: 07/11/17 10:08 AM  Result Value Ref Range   Color, Urine YELLOW YELLOW   APPearance CLEAR CLEAR   Specific Gravity, Urine 1.033 (H) 1.005 - 1.030   pH 6.0 5.0 - 8.0   Glucose, UA >=500 (A) NEGATIVE mg/dL   Hgb urine dipstick NEGATIVE  NEGATIVE   Bilirubin Urine NEGATIVE NEGATIVE   Ketones, ur 5 (A) NEGATIVE mg/dL   Protein, ur NEGATIVE NEGATIVE mg/dL   Nitrite NEGATIVE NEGATIVE   Leukocytes, UA NEGATIVE NEGATIVE   RBC / HPF 0-5 0 - 5 RBC/hpf   WBC, UA 0-5 0 - 5 WBC/hpf   Bacteria, UA NONE SEEN NONE SEEN   Squamous Epithelial / LPF NONE SEEN NONE SEEN    Comment: Performed at [facility], [place] [address] [place], [place] [address]  Glucose, capillary     Status: Abnormal   Collection Time: 07/11/17 12:05 PM  Result Value Ref Range  Glucose-Capillary 287 (H) 65 - 99 mg/dL  Glucose, capillary     Status: Abnormal   Collection Time: 07/11/17  5:21 PM  Result Value Ref Range   Glucose-Capillary 350 (H) 65 - 99 mg/dL  CBC     Status: Abnormal   Collection Time: 07/11/17  6:29 PM  Result Value Ref Range   WBC 10.6 (H) 4.0 - 10.5 K/uL   RBC 5.02 4.22 - 5.81 MIL/uL   Hemoglobin 15.9 13.0 - 17.0 g/dL   HCT 45.3 39.0 - 52.0 %   MCV 90.2 78.0 - 100.0 fL   MCH 31.7 26.0 - 34.0 pg   MCHC 35.1 30.0 - 36.0 g/dL   RDW 13.0 11.5 - 15.5 %   Platelets 229 150 - 400 K/uL    Comment: Performed at [facility], [place] [address], [place], [place] [address]  Comprehensive metabolic panel     Status: Abnormal   Collection Time: 07/11/17  6:29 PM  Result Value Ref Range   Sodium 136 135 - 145 mmol/L   Potassium 3.9 3.5 - 5.1 mmol/L   Chloride 99 (L) 101 - 111 mmol/L   CO2 26 22 - 32 mmol/L   Glucose, Bld 386 (H) 65 - 99 mg/dL   BUN 24 (H) 6 - 20 mg/dL   Creatinine, Ser 1.11 0.61 - 1.24 mg/dL   Calcium 9.4 8.9 - 10.3 mg/dL   Total Protein 7.3 6.5 - 8.1 g/dL   Albumin 4.5 3.5 - 5.0 g/dL   AST 23 15 - 41 U/L   ALT 24 17 - 63 U/L   Alkaline Phosphatase 63 38 - 126 U/L   Total Bilirubin 0.7 0.3 - 1.2 mg/dL   GFR calc non Af Amer >60 >60 mL/min   GFR calc Af Amer >60 >60 mL/min    Comment: (NOTE) The eGFR has been calculated using the CKD EPI equation. This calculation has not been  validated in all clinical situations. eGFR's persistently <60 mL/min signify possible Chronic Kidney Disease.    Anion gap 11 5 - 15    Comment: Performed at [facility], [place] [address], [place], [place] [address]  Ethanol     Status: None   Collection Time: 07/11/17  6:29 PM  Result Value Ref Range   Alcohol, Ethyl (B) <5 <5 mg/dL    Comment:        LOWEST DETECTABLE LIMIT FOR SERUM ALCOHOL IS 5 mg/dL FOR MEDICAL PURPOSES ONLY Performed at [place] [address], [place], [place] [address]   TSH     Status: None   Collection Time: 07/11/17  6:29 PM  Result Value Ref Range   TSH 0.651 0.350 - 4.500 uIU/mL    Comment: Performed by a 3rd Generation assay with a functional sensitivity of <=0.01 uIU/mL. Performed at [facility], [place] [address] [place], [place] [address]   Glucose, capillary     Status: Abnormal   Collection Time: 07/11/17  9:42 PM  Result Value Ref Range   Glucose-Capillary 433 (H) 65 - 99 mg/dL   Comment 1 Notify RN   Glucose, capillary     Status: Abnormal   Collection Time: 07/12/17  6:18 AM  Result Value Ref Range   Glucose-Capillary 177 (H) 65 - 99 mg/dL   Comment 1 Notify RN    Comment 2 Document in Chart     Blood Alcohol level:  Lab Results  Component Value Date   ETH <5  07/11/2017   ETH 25 (H) 67/67/2094    Metabolic Disorder Labs: Lab Results  Component Value Date   HGBA1C 8.5 (H) 09/23/2015   MPG 197 09/23/2015   No results found for: PROLACTIN Lab Results  Component Value Date   CHOL 146 10/17/2015   TRIG 87 10/17/2015   HDL 68 10/17/2015   CHOLHDL 2.1 10/17/2015   VLDL 17 10/17/2015   LDLCALC 61 10/17/2015   LDLCALC 73 09/23/2015    Physical Findings: AIMS: Facial and Oral Movements Muscles of Facial Expression: None, normal Lips and Perioral Area: None, normal Jaw: None, normal Tongue: None, normal,Extremity Movements Upper (arms, wrists, hands, fingers):  None, normal Lower (legs, knees, ankles, toes): None, normal, Trunk Movements Neck, shoulders, hips: None, normal, Overall Severity Severity of abnormal movements (highest score from questions above): None, normal Incapacitation due to abnormal movements: None, normal Patient's awareness of abnormal movements (rate only patient's report): No Awareness, Dental Status Does patient usually wear dentures?: No  CIWA:  CIWA-Ar Total: 2 COWS:  COWS Total Score: 4  Musculoskeletal: Strength & Muscle Tone: within normal limits Gait & Station: normal Patient leans: N/A  Psychiatric Specialty Exam: Physical Exam  Constitutional: He is oriented to person, place, and time. He appears well-developed and well-nourished.  HENT:  Head: Normocephalic.  Eyes: Pupils are equal, round, and reactive to light.  Neck: Normal range of motion.  Cardiovascular: Normal rate.   Respiratory: Effort normal.  Musculoskeletal: Normal range of motion.  Neurological: He is alert and oriented to person, place, and time.  Skin: Skin is warm and dry.  Psychiatric:  As above    ROS  Blood pressure (!) 119/92, pulse 78, temperature 98.6 F (37 C), temperature source Oral, resp. rate 16, height 5' 11.25" (1.81 m), weight 75.6 kg (166 lb 12 oz), SpO2 100 %.Body mass index is 23.09 kg/m.  General Appearance: Neatly dressed, calm and cooperative. Good relatedness. Not in any distress. Mobilizing affect well.   Eye Contact:  Good  Speech:  Spontaneous, normal prosody. Normal tone and rate.   Volume:  Normal  Mood:  Feels better. Not objectively depressed.   Affect:  Appropriate and Full Range  Thought Process:  Linear  Orientation:  Full (Time, Place, and Person)  Thought Content:  Future oriented. No thoughts of violence. No delusional theme. No hallucination in any modality.   Suicidal Thoughts:  No  Homicidal Thoughts:  No  Memory:  Immediate;   Good Recent;   Good Remote;   Good  Judgement:  Good  Insight:   Good  Psychomotor Activity:  Normal  Concentration:  Concentration: Good and Attention Span: Good  Recall:  Good  Fund of Knowledge:  Good  Language:  Good  Akathisia:  Negative  Handed:    AIMS (if indicated):     Assets:  Communication Skills Desire for Improvement Financial Resources/Insurance Housing Physical Health Social Support Transportation Vocational/Educational  ADL's:  Intact  Cognition:  WNL  Sleep:  Number of Hours: 6.5     Treatment Plan Summary: Patient is adjusting well to recent breakup. He is not pervasively depressed. He presented with thoughts of homicide and suicide. Those thoughts has been replaced by more rational thoughts. We have adjusted the dose of his antidepressants. He is tolerating recent dose adjustment well. We plan to evaluate him further. Hopeful  discharge by Sunday.   Psychiatric: MDD Recurrent Adjustment Disorder SUD  Medical:  Psychosocial:  Recent breakup  PLAN: 1. Continue current regimen 2. Continue to  monitor mood, behavior and interaction with peers   Artist Beach, MD 07/12/2017, 12:19 PM

## 2017-07-12 NOTE — Progress Notes (Signed)
  [facility] :  Will you be returning to the same living situation after discharge:  Yes,  home with mother At discharge, do you have transportation home?: Yes,  pt's car in parking lot. Pt scheduled for discharge on Sunday 07/14/17 per Dr. Jackquline BerlinIzediuno  Do you have the ability to pay for your medications: Yes,  [employer] insurance  Release of information consent forms completed and submitted to medical records by CSW.  Patient to Follow up at: Follow-up Information    [facility]   Why:  Follow-up appt for therapy at 3:30PM with Aram Beechamynthia. Thank you.  Contact information: [address] [place], [place] [address] Phone: [phone] Fax:        [facility] Follow up on 07/24/2017.   Why:  Appointment on Wednesday 8/1/18at 10:00AM for evaluation for medication management [facility]. Your first medication management appointment will be Wednesday 9/5 at 11:00AM with Garth SchlatterLori Arena. Please call at discharge to make $20 deposit. Thank you. Contact information: [address] [place] [address] [phone]        [facility] 07[facility].   Why:  Hospital follow-up appt for medication management on Thursday at 11:00AM. After this appt, you [facility].  Contact information: ATTN: Ephriam KnucklesJennifer Chapman [address] [place], [place] [address] Phone: [phone] Fax: [phone]          Next level of care provider has access to [facility] Link:no  Safety Planning and Suicide Prevention discussed: Yes,  SPE completed with both pt and his mother. SPI pamphlet and Mobile Crisis information provided to pt.   Have you used any form of tobacco in the last 30 days? (Cigarettes, Smokeless Tobacco, Cigars, and/or Pipes): Yes  Has patient been referred to the Quitline?: Patient refused referral  Patient  has been referred for addiction treatment: Yes  Hisako Bugh N Smart LCSW 07/12/2017, 3:03 PM

## 2017-07-12 NOTE — Progress Notes (Signed)
  D: Pt asked the writer about exercising to help decrease his blood sugar. Asked if he had any options for exercising. Informed pt that he could do sit ups or exercises similar in his room. Also discussed sleep med either doxepin or vistaril. Pt has no questions or concerns.   A; Received an order for vistaril. Support and encouragement was offered. 15 min checks continued for safety.  R: Pt remains safe.

## 2017-07-12 NOTE — Progress Notes (Signed)
Gonzella LexBrian Cheek Audiological scientist(Security Manager) notified of pt's upcoming discharge and states that, given information about the patient's situation, there is no need for duty to warn. The patient is no longer endorsing any HI thoughts and has no access to guns/firearms. Pt is also returning home with his mother at discharge. CSW spoke with pt's mother who also stated that she has no concerns regarding pt safety or the safety of others. She visited the patient last night and states that pt appears much better and is looking forward to him discharging home.  MD notified of above.   Trula SladeHeather Smart, MSW, LCSW Clinical Social Worker 07/12/2017 1:14 PM

## 2017-07-12 NOTE — Progress Notes (Signed)
Nursing Note 07/12/2017 [id number]  Data Reports sleeping good with PRN sleep med.  Rates depression 5/10, hopelessness 3/10, and anxiety 7/10. Affect appropriate.  Denies HI, SI, AVH.  Pleasant, attending groups, denies concerns today.   Action Spoke with patient 1:1, nurse offered support to patient throughout shift.  Continues to be monitored on 15 minute checks for safety.  Response Remains safe on unit.

## 2017-07-12 NOTE — [facility] Suicide Risk Assessment (Signed)
[facility] INPATIENT:  Family/Significant Other Suicide Prevention Education  Suicide Prevention Education:  Education Completed; Gordy SaversDannette Hartland (pt's mother) [phone] has been identified by the patient as the family member/significant other with whom the patient will be residing, and identified as the person(s) who will aid the patient in the event of a mental health crisis (suicidal ideations/suicide attempt).  With written consent from the patient, the family member/significant other has been provided the following suicide prevention education, prior to the and/or following the discharge of the patient.  The suicide prevention education provided includes the following:  Suicide risk factors  Suicide prevention and interventions  National Suicide Hotline telephone number  [facility] assessment telephone number  [facility] 911  [facility] and/or [facility] telephone number  Request made of family/significant other to:  Remove weapons (e.g., guns, rifles, knives), all items previously/currently identified as safety concern.    Remove drugs/medications (over-the-counter, prescriptions, illicit drugs), all items previously/currently identified as a safety concern.  The family member/significant other verbalizes understanding of the suicide prevention education information provided.  The family member/significant other agrees to remove the items of safety concern listed above.  Pt's mother has no concerns regarding patient discharging this weekend and is hoping for him to come home on Saturday since his 7yo son will be visiting. Pt lives with his mother; she reports that there are no guns/weapons in the home. SPE and aftercare plan reviewed with her.  Eleanor Dimichele N Smart LCSW 07/12/2017, 12:01 PM

## 2017-07-12 NOTE — Tx Team (Signed)
Interdisciplinary Treatment and Diagnostic Plan Update  07/12/2017 Time of Session: 845AM Micheal Lopez MRN: [id number]  Principal Diagnosis: Major depressive disorder, recurrent severe without psychotic features (HCC)  Secondary Diagnoses: Principal Problem:   Major depressive disorder, recurrent severe without psychotic features (HCC) Active Problems:   MDD (major depressive disorder), recurrent severe, without psychosis (HCC)   Current Medications:  Current Facility-Administered Medications  Medication Dose Route Frequency Provider Last Rate Last Dose  . acetaminophen (TYLENOL) tablet 650 mg  650 mg Oral Q6H PRN Laveda Abbe, NP      . alum & mag hydroxide-simeth (MAALOX/MYLANTA) 200-200-20 MG/5ML suspension 30 mL  30 mL Oral Q4H PRN Laveda Abbe, NP      . FLUoxetine (PROZAC) capsule 40 mg  40 mg Oral Daily Denzil Magnuson, NP   40 mg at 07/12/17 1015  . hydrOXYzine (ATARAX/VISTARIL) tablet 10 mg  10 mg Oral TID PRN Georgiann Cocker, MD      . insulin aspart (novoLOG) injection 0-6 Units  0-6 Units Subcutaneous TID WC Izediuno, Delight Ovens, MD   2 Units at 07/12/17 1200  . insulin aspart (novoLOG) injection 6 Units  6 Units Subcutaneous BID WC Izediuno, Vincent A, MD   6 Units at 07/12/17 1200   And  . insulin aspart (novoLOG) injection 8 Units  8 Units Subcutaneous QAC supper Georgiann Cocker, MD   8 Units at 07/11/17 1737  . insulin glargine (LANTUS) injection 24 Units  24 Units Subcutaneous QHS Georgiann Cocker, MD   24 Units at 07/11/17 2221  . magnesium hydroxide (MILK OF MAGNESIA) suspension 30 mL  30 mL Oral Daily PRN Laveda Abbe, NP      . nicotine (NICODERM CQ - dosed in mg/24 hours) patch 14 mg  14 mg Transdermal Daily Denzil Magnuson, NP   14 mg at 07/12/17 1015   PTA Medications: Prescriptions Prior to Admission  Medication Sig Dispense Refill Last Dose  . insulin aspart (NOVOLOG) 100 UNIT/ML injection Inject 6 Units into the skin 2  (two) times daily with breakfast and lunch.     . insulin aspart (NOVOLOG) 100 UNIT/ML injection Inject 8 Units into the skin daily before supper.     . Insulin Degludec (TRESIBA FLEXTOUCH Ernest) Inject 24 Units into the skin at bedtime.     Marland Kitchen FLUoxetine (PROZAC) 20 MG capsule Take 1 capsule (20 mg total) by mouth daily. 30 capsule 0 08/25/2016 at Unknown time  . hydrOXYzine (ATARAX/VISTARIL) 25 MG tablet Take 1 tablet (25 mg total) by mouth 3 (three) times daily as needed (anxiety, insomnia). 30 tablet 0 unknown  . insulin aspart (NOVOLOG) 100 UNIT/ML injection Inject 0-10 Units into the skin 3 (three) times daily before meals. 150-200 = 1 unit 201-250 = 2 unit 251-300 = 3 unit 301-350 = 4 unit 351-400 = 5 units   08/26/2016 at Unknown time  . Melatonin 3 MG TABS Take 6 mg by mouth at bedtime as needed (sleep).   3-4 days ago    Patient Stressors: Substance abuse  Patient Strengths: Ability for insight Financial means  Treatment Modalities: Medication Management, Group therapy, Case management,  1 to 1 session with clinician, Psychoeducation, Recreational therapy.   Physician Treatment Plan for Primary Diagnosis: Major depressive disorder, recurrent severe without psychotic features (HCC) Long Term Goal(s): Improvement in symptoms so as ready for discharge Improvement in symptoms so as ready for discharge   Short Term Goals: Ability to identify changes in lifestyle to reduce recurrence of  condition will improve Ability to verbalize feelings will improve Compliance with prescribed medications will improve Ability to identify triggers associated with substance abuse/mental health issues will improve Ability to disclose and discuss suicidal ideas Ability to identify and develop effective coping behaviors will improve  Medication Management: Evaluate patient's response, side effects, and tolerance of medication regimen.  Therapeutic Interventions: 1 to 1 sessions, Unit Group sessions and  Medication administration.  Evaluation of Outcomes: Adequate for discharge   Physician Treatment Plan for Secondary Diagnosis: Principal Problem:   Major depressive disorder, recurrent severe without psychotic features (HCC) Active Problems:   MDD (major depressive disorder), recurrent severe, without psychosis (HCC)  Long Term Goal(s): Improvement in symptoms so as ready for discharge Improvement in symptoms so as ready for discharge   Short Term Goals: Ability to identify changes in lifestyle to reduce recurrence of condition will improve Ability to verbalize feelings will improve Compliance with prescribed medications will improve Ability to identify triggers associated with substance abuse/mental health issues will improve Ability to disclose and discuss suicidal ideas Ability to identify and develop effective coping behaviors will improve     Medication Management: Evaluate patient's response, side effects, and tolerance of medication regimen.  Therapeutic Interventions: 1 to 1 sessions, Unit Group sessions and Medication administration.  Evaluation of Outcomes: Adequate for discharge   RN Treatment Plan for Primary Diagnosis: Major depressive disorder, recurrent severe without psychotic features (HCC) Long Term Goal(s): Knowledge of disease and therapeutic regimen to maintain health will improve  Short Term Goals: Ability to remain free from injury will improve, Ability to disclose and discuss suicidal ideas and Ability to identify and develop effective coping behaviors will improve  Medication Management: RN will administer medications as ordered by provider, will assess and evaluate patient's response and provide education to patient for prescribed medication. RN will report any adverse and/or side effects to prescribing provider.  Therapeutic Interventions: 1 on 1 counseling sessions, Psychoeducation, Medication administration, Evaluate responses to treatment, Monitor vital  signs and CBGs as ordered, Perform/monitor CIWA, COWS, AIMS and Fall Risk screenings as ordered, Perform wound care treatments as ordered.  Evaluation of Outcomes: Adequate for discharge    LCSW Treatment Plan for Primary Diagnosis: Major depressive disorder, recurrent severe without psychotic features (HCC) Long Term Goal(s): Safe transition to appropriate next level of care at discharge, Engage patient in therapeutic group addressing interpersonal concerns.  Short Term Goals: Engage patient in aftercare planning with referrals and resources, Facilitate patient progression through stages of change regarding substance use diagnoses and concerns and Identify triggers associated with mental health/substance abuse issues  Therapeutic Interventions: Assess for all discharge needs, 1 to 1 time with Social worker, Explore available resources and support systems, Assess for adequacy in community support network, Educate family and significant other(s) on suicide prevention, Complete Psychosocial Assessment, Interpersonal group therapy.  Evaluation of Outcomes: Adequate for discharge   Progress in Treatment: Attending groups: Yes Participating in groups: Yes Taking medication as prescribed: Yes. Toleration medication: Yes. Family/Significant other contact made: SPE completed with pt's mother. Collateral information also obtained.  Patient understands diagnosis: Yes. Discussing patient identified problems/goals with staff: Yes. Medical problems stabilized or resolved: Yes. Denies suicidal/homicidal ideation: Yes. Issues/concerns per patient self-inventory: No. Other: n/a  New problem(s) identified: No, Describe:  n/a   New Short Term/Long Term Goal(s): detox, medication management for mood stabilization, elimination of SI thoughts, and development of comprehensive mental wellness/sobriety plan.   Discharge Plan or Barriers: Referral made to [facility] for  medication management-Pt  will continue to see his PCP until his first appt there in early September. Pt follow-up made with his thrapist at [facility] as well. Pt provided with work note per his request and MHAG pamphlet for additional community support.   Reason for Continuation of Hospitalization: medication management  Estimated Length of Stay: Sunday, 07/14/17  Attendees: Patient: 07/12/2017 1:15 PM  Physician: Dr. Jackquline Berlin MD;Dr Lucianne Muss MD 07/12/2017 1:15 PM  Nursing: Stacy Gardner RN 07/12/2017 1:15 PM  RN Care Manager: Onnie Boer CM 07/12/2017 1:15 PM  Social Worker: [employer], LCSW 07/12/2017 1:15 PM  Recreational Therapist: x 07/12/2017 1:15 PM  Other: Armandina Stammer NP; Denzil Magnuson NP 07/12/2017 1:15 PM  Other:  07/12/2017 1:15 PM  Other: 07/12/2017 1:15 PM    Scribe for Treatment Team: Ledell Peoples Smart, LCSW 07/12/2017 1:15 PM

## 2017-07-13 LAB — GLUCOSE, CAPILLARY
GLUCOSE-CAPILLARY: 211 mg/dL — AB (ref 65–99)
Glucose-Capillary: 263 mg/dL — ABNORMAL HIGH (ref 65–99)
Glucose-Capillary: 332 mg/dL — ABNORMAL HIGH (ref 65–99)
Glucose-Capillary: 243 mg/dL — ABNORMAL HIGH (ref 65–99)

## 2017-07-13 LAB — PROLACTIN: Prolactin: 19.3 ng/mL — ABNORMAL HIGH (ref 4.0–15.2)

## 2017-07-13 NOTE — Progress Notes (Signed)
Patient did attend the evening speaker AA meeting.  

## 2017-07-13 NOTE — [facility] Group Notes (Signed)
[facility] LCSW Group Therapy Note  07/13/2017  and  10:00 - 11:00 AM  Type of Therapy and Topic:  Group Therapy: Avoiding Self-Sabotaging and Enabling Behaviors  Participation Level:  Active  Participation Quality: Attentive and sharing  Affect:  Appropriate  Cognitive:  Alert and Oriented  Insight:  Developing  Engagement in Therapy:  Developing  Therapeutic models used: Cognitive Behavioral Therapy,  Person-Centered Therapy and Motivational Interviewing  Modes of Intervention:  Discussion, Exploration, Orientation, Rapport Building, Socialization and Support   Summary of Progress/Problems:  The main focus of today's process group was for the patient to identify ways in which they have in the past sabotaged their own recovery. Motivational Interviewing was utilized to identify motivation they may have for wanting to change. The Stages of Change were explained using a handout, and patients identified where they currently are with regard to stages of change. Patient shared that he has experienced similar disappointments in multiple relationships and is to the point that he expects same in future relationships. Patient processed his feelings about delaying commitment.     Carney Bernatherine C Harrill, LCSW

## 2017-07-13 NOTE — [facility] Group Notes (Signed)
Group Progress Note  Nursing Psychoeducational Group  [id number]  This group included a 20 minute long TedTalk entitled "The Power of Vulnerability" by Brene Brown.  After introducing themselves the patients watched the video, then we had an open discussion about it for 10 minutes.  Afterwards the patients listed educational topics while the nurse taught on them.  Among these topics included Bipolar, depression, addiction and drug dependence  The patient attended for the duration of the group and participated well 

## 2017-07-13 NOTE — Progress Notes (Signed)
Patient ID: Micheal Lopez, male   DOB: [birth year], 31 y.o.   MRN: [id number]   D: Patient pleasant on approach tonight. Reports improvement in mood. Smiling and interacting well on the unit. Contracts for safety with no active SI at present.  A: Staff will monitor on q 15 minute checks, follow treatment plan, and give medications as ordered. R: Cooperative on the unit and compliant with diabetic monitoring. Trying to eat the sugar free snacks on the unit.

## 2017-07-13 NOTE — Progress Notes (Signed)
[facility] MD Progress Note  07/13/2017 2:35 PM Micheal Lopez  MRN:  [id number] Subjective:   31 yo Caucasian male, divorced, lives with his mother, employed. Known history of SUD, MDD, IDDM and head injury. Self presented for voluntary admission. Recently found out that his girlfriend was having an affair. Felt homicidal towards her. Had thoughts suicide.   Chart reviewed today. Patient discussed at team today.   Staff reports that patient has been participating well at unit groups. He has not been observed to be withdrawn. He is appropriate and has not voiced any thoughts of violence. He is looking forward to discharge tomorrow.   Seen today. [place] at the gymn earlier today. Says he is in good spirits. He talked to his ex-girlfriend over the phone. Says it went well. She plans to come pick up her stuff in a couple of days. Says the plan is that his mom would be around when she comes. Says the conversation helped broke the ice. No violent thoughts towards her. No suicidal thoughts. No homicidal thoughts. No difficulties with his medications.   Principal Problem: Major depressive disorder, recurrent severe without psychotic features ([place]) Diagnosis:   Patient Active Problem List   Diagnosis Date Noted  . MDD (major depressive disorder), recurrent severe, without psychosis ([place]) [F33.2] 07/11/2017  . Alcohol abuse [F10.10]   . Alcohol abuse with alcohol-induced mood disorder ([place]) [F10.14] 08/23/2016  . Major depressive disorder, recurrent severe without psychotic features ([place]) [F33.2] 07/31/2016  . Alcohol intoxication ([place]) [F10.929]   . Substance induced mood disorder ([place]) [F19.94] 09/22/2015  . Alcohol use disorder, severe, dependence ([place]) [F10.20] 09/21/2015  . Suicidal ideation [R45.851]    Total Time spent with patient: 20 minutes  Past Psychiatric History: As in H&P  Past Medical History:  Past Medical History:  Diagnosis Date  . Depression    per pt.  . Diabetes mellitus without  complication (HCC)    Type I   History reviewed. No pertinent surgical history. Family History:  Family History  Problem Relation Age of Onset  . Cancer Maternal Grandmother        BREAST    Family Psychiatric  History: As in H&P Social History:  History  Alcohol Use  . Yes    Comment: binge drinking     History  Drug Use  . Frequency: 7.0 times per week  . Types: Marijuana    Social History   Social History  . Marital status: Legally Separated    Spouse name: N/A  . Number of children: N/A  . Years of education: N/A   Social History Main Topics  . Smoking status: Current Every Day Smoker    Packs/day: 1.00    Types: E-cigarettes, Cigarettes  . Smokeless tobacco: Never Used  . Alcohol use Yes     Comment: binge drinking  . Drug use: Yes    Frequency: 7.0 times per week    Types: Marijuana  . Sexual activity: No   Other Topics Concern  . None   Social History Narrative  . None   Additional Social History:    Pain Medications: please see mar Prescriptions: please see mar Over the Counter: please see mar History of alcohol / drug use?: Yes Longest period of sobriety (when/how long): unknown Negative Consequences of Use: Personal relationships Name of Substance 1: alcohol 1 - Age of First Use: unknown 1 - Amount (size/oz): unknown 1 - Frequency: occasional 1 - Duration: ongoing 1 - Last Use / Amount: "cant remember"  Sleep: Good  Appetite:  Good  Current Medications: Current Facility-Administered Medications  Medication Dose Route Frequency Provider Last Rate Last Dose  . acetaminophen (TYLENOL) tablet 650 mg  650 mg Oral Q6H PRN Ethelene Hal, NP      . alum & mag hydroxide-simeth (MAALOX/MYLANTA) 200-200-20 MG/5ML suspension 30 mL  30 mL Oral Q4H PRN Ethelene Hal, NP      . FLUoxetine (PROZAC) capsule 40 mg  40 mg Oral Daily Mordecai Maes, NP   40 mg at 07/13/17 0748  . hydrOXYzine (ATARAX/VISTARIL) tablet 10 mg  10 mg  Oral TID PRN Artist Beach, MD   10 mg at 07/12/17 2230  . insulin aspart (novoLOG) injection 0-6 Units  0-6 Units Subcutaneous TID WC Izediuno, Laruth Bouchard, MD   3 Units at 07/13/17 1216  . insulin aspart (novoLOG) injection 6 Units  6 Units Subcutaneous BID WC Izediuno, Laruth Bouchard, MD   6 Units at 07/13/17 1216   And  . insulin aspart (novoLOG) injection 8 Units  8 Units Subcutaneous QAC supper Artist Beach, MD   8 Units at 07/12/17 1724  . insulin glargine (LANTUS) injection 24 Units  24 Units Subcutaneous QHS Artist Beach, MD   24 Units at 07/12/17 2149  . magnesium hydroxide (MILK OF MAGNESIA) suspension 30 mL  30 mL Oral Daily PRN Ethelene Hal, NP      . nicotine (NICODERM CQ - dosed in mg/24 hours) patch 14 mg  14 mg Transdermal Daily Mordecai Maes, NP   14 mg at 07/13/17 6761    Lab Results:  Results for orders placed or performed during the hospital encounter of 07/11/17 (from the past 48 hour(s))  Glucose, capillary     Status: Abnormal   Collection Time: 07/11/17  5:21 PM  Result Value Ref Range   Glucose-Capillary 350 (H) 65 - 99 mg/dL  CBC     Status: Abnormal   Collection Time: 07/11/17  6:29 PM  Result Value Ref Range   WBC 10.6 (H) 4.0 - 10.5 K/uL   RBC 5.02 4.22 - 5.81 MIL/uL   Hemoglobin 15.9 13.0 - 17.0 g/dL   HCT 45.3 39.0 - 52.0 %   MCV 90.2 78.0 - 100.0 fL   MCH 31.7 26.0 - 34.0 pg   MCHC 35.1 30.0 - 36.0 g/dL   RDW 13.0 11.5 - 15.5 %   Platelets 229 150 - 400 K/uL    Comment: Performed at [facility], [place] [address] [place], [place] [address]  Comprehensive metabolic panel     Status: Abnormal   Collection Time: 07/11/17  6:29 PM  Result Value Ref Range   Sodium 136 135 - 145 mmol/L   Potassium 3.9 3.5 - 5.1 mmol/L   Chloride 99 (L) 101 - 111 mmol/L   CO2 26 22 - 32 mmol/L   Glucose, Bld 386 (H) 65 - 99 mg/dL   BUN 24 (H) 6 - 20 mg/dL   Creatinine, Ser 1.11 0.61 - 1.24 mg/dL   Calcium 9.4 8.9 - 10.3 mg/dL    Total Protein 7.3 6.5 - 8.1 g/dL   Albumin 4.5 3.5 - 5.0 g/dL   AST 23 15 - 41 U/L   ALT 24 17 - 63 U/L   Alkaline Phosphatase 63 38 - 126 U/L   Total Bilirubin 0.7 0.3 - 1.2 mg/dL   GFR calc non Af Amer >60 >60 mL/min   GFR calc Af Amer >60 >60 mL/min  Comment: (NOTE) The eGFR has been calculated using the CKD EPI equation. This calculation has not been validated in all clinical situations. eGFR's persistently <60 mL/min signify possible Chronic Kidney Disease.    Anion gap 11 5 - 15    Comment: Performed at [facility], [place] [address] [place], [place] [address]  Ethanol     Status: None   Collection Time: 07/11/17  6:29 PM  Result Value Ref Range   Alcohol, Ethyl (B) <5 <5 mg/dL    Comment:        LOWEST DETECTABLE LIMIT FOR SERUM ALCOHOL IS 5 mg/dL FOR MEDICAL PURPOSES ONLY Performed at [facility] [address], [place], [place] [address]   Prolactin     Status: Abnormal   Collection Time: 07/11/17  6:29 PM  Result Value Ref Range   Prolactin 19.3 (H) 4.0 - 15.2 ng/mL    Comment: (NOTE) Performed At: [facility] [place], [place] [id number] Lindon Romp MD [id number] Performed at [facility], [place] [address] [place], [place] [address]   TSH     Status: None   Collection Time: 07/11/17  6:29 PM  Result Value Ref Range   TSH 0.651 0.350 - 4.500 uIU/mL    Comment: Performed by a 3rd Generation assay with a functional sensitivity of <=0.01 uIU/mL. Performed at [facility], [place] [address], [place],  [address]   Glucose, capillary     Status: Abnormal   Collection Time: 07/11/17  9:42 PM  Result Value Ref Range   Glucose-Capillary 433 (H) 65 - 99 mg/dL   Comment 1 Notify RN   Glucose, capillary     Status: Abnormal   Collection Time: 07/12/17  6:18 AM  Result Value Ref Range   Glucose-Capillary 177 (H) 65 - 99 mg/dL   Comment 1 Notify RN     Comment 2 Document in Chart   Glucose, capillary     Status: Abnormal   Collection Time: 07/12/17 12:19 PM  Result Value Ref Range   Glucose-Capillary 211 (H) 65 - 99 mg/dL  Glucose, capillary     Status: Abnormal   Collection Time: 07/12/17  4:52 PM  Result Value Ref Range   Glucose-Capillary 344 (H) 65 - 99 mg/dL  Glucose, capillary     Status: Abnormal   Collection Time: 07/12/17  9:17 PM  Result Value Ref Range   Glucose-Capillary 166 (H) 65 - 99 mg/dL  Glucose, capillary     Status: Abnormal   Collection Time: 07/13/17  6:02 AM  Result Value Ref Range   Glucose-Capillary 211 (H) 65 - 99 mg/dL  Glucose, capillary     Status: Abnormal   Collection Time: 07/13/17 11:40 AM  Result Value Ref Range   Glucose-Capillary 263 (H) 65 - 99 mg/dL    Blood Alcohol level:  Lab Results  Component Value Date   ETH <5 07/11/2017   ETH 25 (H) 81/44/8185    Metabolic Disorder Labs: Lab Results  Component Value Date   HGBA1C 8.5 (H) 09/23/2015   MPG 197 09/23/2015   Lab Results  Component Value Date   PROLACTIN 19.3 (H) 07/11/2017   Lab Results  Component Value Date   CHOL 146 10/17/2015   TRIG 87 10/17/2015   HDL 68 10/17/2015   CHOLHDL 2.1 10/17/2015   VLDL 17 10/17/2015   LDLCALC 61 10/17/2015   LDLCALC 73 09/23/2015    Physical Findings: AIMS: Facial and Oral  Movements Muscles of Facial Expression: None, normal Lips and Perioral Area: None, normal Jaw: None, normal Tongue: None, normal,Extremity Movements Upper (arms, wrists, hands, fingers): None, normal Lower (legs, knees, ankles, toes): None, normal, Trunk Movements Neck, shoulders, hips: None, normal, Overall Severity Severity of abnormal movements (highest score from questions above): None, normal Incapacitation due to abnormal movements: None, normal Patient's awareness of abnormal movements (rate only patient's report): No Awareness, Dental Status Current problems with teeth and/or dentures?: No Does patient  usually wear dentures?: No  CIWA:  CIWA-Ar Total: 2 COWS:  COWS Total Score: 4  Musculoskeletal: Strength & Muscle Tone: within normal limits Gait & Station: normal Patient leans: N/A  Psychiatric Specialty Exam: Physical Exam  Constitutional: He is oriented to person, place, and time. He appears well-developed and well-nourished.  HENT:  Head: Normocephalic.  Eyes: Pupils are equal, round, and reactive to light.  Neck: Normal range of motion.  Cardiovascular: Normal rate.   Respiratory: Effort normal.  Musculoskeletal: Normal range of motion.  Neurological: He is alert and oriented to person, place, and time.  Skin: Skin is warm and dry.  Psychiatric:  As above    ROS  Blood pressure 121/84, pulse 96, temperature 98.3 F (36.8 C), temperature source Oral, resp. rate 16, height 5' 11.25" (1.81 m), weight 75.6 kg (166 lb 12 oz), SpO2 100 %.Body mass index is 23.09 kg/m.  General Appearance: Neatly dressed, pleasant, engaging well and cooperative. Appropriate behavior. Not in any distress. Good relatedness. Not internally stimulated   Eye Contact:  Good  Speech:  Spontaneous, normal prosody. Normal tone and rate.   Volume:  Normal  Mood: Euthymic  Affect:  Appropriate and Full Range  Thought Process:  Linear  Orientation:  Full (Time, Place, and Person)  Thought Content:  Future oriented. No thoughts of violence. No delusional theme. No hallucination in any modality.   Suicidal Thoughts:  No  Homicidal Thoughts:  No  Memory:  Immediate;   Good Recent;   Good Remote;   Good  Judgement:  Good  Insight:  Good  Psychomotor Activity:  Normal  Concentration:  Concentration: Good and Attention Span: Good  Recall:  Good  Fund of Knowledge:  Good  Language:  Good  Akathisia:  Negative  Handed:    AIMS (if indicated):     Assets:  Communication Skills Desire for Improvement Financial Resources/Insurance Housing Physical Health Social  Support Transportation Vocational/Educational  ADL's:  Intact  Cognition:  WNL  Sleep:  Number of Hours: 6.5     Treatment Plan Summary: Patient is adjusting well to recent breakup. He is not pervasively depressed. He presented with thoughts of homicide and suicide. Scheduled for discharge tomorrow.  Psychiatric: MDD Recurrent Adjustment Disorder SUD  Medical:  Psychosocial:  Recent breakup  PLAN: 1. Continue current regimen 2. Continue to monitor mood, behavior and interaction with peers   Artist Beach, MD 07/13/2017, 2:35 PMPatient ID: Micheal Lopez, male   DOB: [birth year], 31 y.o.   MRN: [id number]

## 2017-07-13 NOTE — Progress Notes (Signed)
Nursing Note 07/13/2017 [id number]  Data Reports sleeping good with PRN sleep med.  Rates depression 3/10, hopelessness 1/10, and anxiety 7/10. Affect wide ranged and appropriate. Denies HI, SI, AVH.  Attending groups, engaged in milieu and groups.  Polite. Denies concerns today.  Action Spoke with patient 1:1, nurse offered support to patient throughout shift.  Continues to be monitored on 15 minute checks for safety.  Response Remains safe on unit.

## 2017-07-14 LAB — GLUCOSE, CAPILLARY: GLUCOSE-CAPILLARY: 200 mg/dL — AB (ref 65–99)

## 2017-07-14 MED ORDER — HYDROXYZINE HCL 10 MG PO TABS: 10.0000 mg | ORAL_TABLET | Freq: Three times a day (TID) | ORAL | 0 refills | Status: DC | PRN

## 2017-07-14 MED ORDER — FLUOXETINE HCL 40 MG PO CAPS: 40.0000 mg | ORAL_CAPSULE | Freq: Every day | ORAL | 0 refills | Status: DC

## 2017-07-14 MED ORDER — NICOTINE 14 MG/24HR TD PT24: 14.0000 mg | MEDICATED_PATCH | Freq: Every day | TRANSDERMAL | 0 refills | Status: DC

## 2017-07-14 NOTE — Progress Notes (Signed)
D.  Pt pleasant on approach, denies complaints at this time.  Pt was positive for evening AA group and spoke appropriately to representatives after group.  Pt observed engaged in appropriate interaction with peers on the unit.  Pt denies SI/HI/AVH at this time.  A. Support and encouragement offered, medication given as ordered  R.  Pt remains safe on the unit, will continue to monitor.

## 2017-07-14 NOTE — Discharge Summary (Signed)
Physician Discharge Summary Note  Patient:  Micheal Lopez is an 31 y.o., male MRN:  [id number] DOB:  May 08, 1986 Patient phone:  [phone] (home)  Patient address:   [address] [place] [address],  Total Time spent with patient: 30 minutes  Date of Admission:  07/11/2017 Date of Discharge: 07/14/2017  Reason for Admission:  31 year old male admitted to [facility] for suicidal ideation with no direct plan and homicidal ideas without intent or plan. Patient acknowledges his reason for admission. He endorses he found out some bad new regarding his girlfriend around 3:00 am this morning and it caused him to have the thoughts as mentioned above. He endorses that after he learned about the information, he rode around time and brought himself to the hospital. Although he denies any specific plan, as per assessment note, patient admitted that he had multiple plans to hurt himself as well as a plan to stab his girlfriend in the face with a knife. Patient denies wanting to hurt his girlfriend at all although he currently endorses HI towards, " the one she was with."   Patient endorses a psychiatric hx of depression and anxiety. He endorses multiple SA in the past with the last attempt occurring 1-2 months ago. He endorses at that time, he attempted to strangle himself. He endorses intermittent SI when becoming, " overwhelmed." Describes depressive symptoms as low mood. Low energy, decreased appetite, and changes in sleeping pattern. Reports a substance abuse history that include ETOH and THC. He reports drinking alcohol occasionally however endorses smoking marijuana several days per week. He denies withdrawal symptoms at this time. [facility] for his substance use. Reports he has experimented with other drugs in thepast yet does not provided details and reports no recent use.Endorses that he does struggle with controlling his anger. Reports when he becomes  upset he throws things, yells, screams. He denies AVH, paranoia, delusions or other psychotic process. Denies any history of cutting or self-injurious behaviors. Reports one prior admission to [facility] 1 year ago. Reports receiving out patient therapy bi-weekly at the [facility] for his mental health needs. Reports current psychiatric medications as Prozac and Gabapentin.Reports medications are prescribed by his PCP Ephriam Knuckles at [facility]. Reports no recent medication dose changes. Reports he does not take the Neurontin consistently as he feels as though it is not effective for his anxiety. Reports using Lamictal in the past which was not effective so the medication was discontinued. Patient denies any legal history. He reports family psychiatric history as unknown.  Patient endorsed current stressors as relationship in general although he does not provide other details bedside current relationship issues with his girlfriend. He endorses that he lives with his mother and their relationship is well. Reports that he has a son and has never been married. Reports that he has been smoking E-cigarettes and Cigarettes at least a pack per day.  Patients medical history is remarkable for Type I Diabetes and he reports he is currently on Insulin (huamalog ) and Tresiba insulin.    Associated Signs/Symptoms: Depression Symptoms:  depressed mood, fatigue, suicidal thoughts without plan, anxiety, loss of energy/fatigue, disturbed sleep, decreased appetite, homicdal thoughts (Hypo) Manic Symptoms:  none  Anxiety Symptoms:  Excessive Worry, Psychotic Symptoms:  denies PTSD Symptoms: NA Total Time spent with patient: 1 hour  Past Psychiatric History: Depression and anxiety. Multiple SA in the past. Substance abuse history that include ETOH and THC. [facility]  [facility] years ago for his substance use. One prior admission to [facility] 1 year ago. Receiving out  patient therapy bi-weekly at the [facility] for his mental health needs. Current psychiatric medications are Prozac 20 mg and Gabapentin. Medications are prescribed by his PCP Ephriam Knuckles at [facility]. Reports he does not take the Neurontin consistently as he feels as though it is not effective for his anxiety. Reports using Lamictal in the past which was not effective.   Principal Problem: Major depressive disorder, recurrent severe without psychotic features [facility]) Discharge Diagnoses: Patient Active Problem List   Diagnosis Date Noted  . MDD (major depressive disorder), recurrent severe, without psychosis (HCC) [F33.2] 07/11/2017  . Alcohol abuse [F10.10]   . Alcohol abuse with alcohol-induced mood disorder (HCC) [F10.14] 08/23/2016  . Major depressive disorder, recurrent severe without psychotic features (HCC) [F33.2] 07/31/2016  . Alcohol intoxication (HCC) [F10.929]   . Substance induced mood disorder (HCC) [F19.94] 09/22/2015  . Alcohol use disorder, severe, dependence (HCC) [F10.20] 09/21/2015  . Suicidal ideation [R45.851]     Past Medical History:  Past Medical History:  Diagnosis Date  . Depression    per pt.  . Diabetes mellitus without complication (HCC)    Type I   History reviewed. No pertinent surgical history. Family History:  Family History  Problem Relation Age of Onset  . Cancer Maternal Grandmother        BREAST    Family Psychiatric  History:  Reports family psychiatric history as unknown.  Social History:  History  Alcohol Use  . Yes    Comment: binge drinking     History  Drug Use  . Frequency: 7.0 times per week  . Types: Marijuana    Social History   Social History  . Marital status: Legally Separated    Spouse name: N/A  . Number of children: N/A  . Years of education: N/A   Social History Main Topics  . Smoking status: Current Every Day Smoker    Packs/day: 1.00    Types: E-cigarettes, Cigarettes  .  Smokeless tobacco: Never Used  . Alcohol use Yes     Comment: binge drinking  . Drug use: Yes    Frequency: 7.0 times per week    Types: Marijuana  . Sexual activity: No   Other Topics Concern  . None   Social History Narrative  . None    Hospital Course:  [facility] 31 y.[facility].male  complains of having suicidal thoughts for the past day.  He says he may want to hang himself but that he does not have a specific plan. Patient has been drinking and using THC.    Micheal Lopez was admitted for Major depressive disorder, recurrent severe without psychotic features (HCC) and crisis management. Medication management to reduce current symptoms to base line and improve the patient's overall level of functioning: Patient endorses that Prozac is effective although he would like to start Wellbutrin for long term smoking cessation. Discussed this with MD as well as patient. Advised patient that Wellbutrin can increase the risk of seizures with alcohol use as well as increase anxiety. Discussed with patient that due to his history of anxiety and ETOH use, Wellbutrin would not be a good choice. At this time, will increase Prozac to 40 mg po daily for depression management and anxiety and add Buspar 5 mg po TID for further anxiety management. Patient receptive to plan. Patient also endorsed that he would  like a nicotine patch although he heard that it may cause, " bad dreams." He was willing to try patch at a lower dose so 14 mg nicotine patch will be initiated. Will resume home medications for medical conditions as noted in [facility].   He was treated with Prozac 40 mg daily depression, Hydroxyzine 25 mg PRN anxiety.  Medical problems were identified and treated as needed.  Home medications were restarted as appropriate.  Improvement was monitored by observation and [facility] daily report of symptom reduction.  Emotional and mental status was monitored by daily self inventory reports completed by Micheal Lopez  and clinical staff.  Patient reported continued improvement, denied any new concerns.  Patient had been compliant on medications and denied side effects.  Support and encouragement was provided.    Patient encouraged to attend groups to help with recognizing triggers of emotional crises and de-stabilizations.  Patient encouraged to attend group to help identify the positive things in life that would help in dealing with feelings of loss, depression and unhealthy or abusive tendencies.         Micheal Lopez was evaluated by the treatment team for stability and plans for continued recovery upon discharge.  He was offered further treatment options upon discharge including Residential, Intensive Outpatient and Outpatient treatment.  He will follow up with agencies listed below for medication management and counseling.  Encouraged patient to maintain satisfactory support network and home environment.  Advised to adhere to medication compliance and outpatient treatment follow up.  Prescriptions provided.       [employer] motivation was an integral factor for scheduling further treatment.  Employment, transportation, bed availability, health status, family support, and any pending legal issues were also considered during his hospital stay.  Upon completion of this admission the patient was both mentally and medically stable for discharge denying suicidal/homicidal ideation, auditory/visual/tactile hallucinations, delusional thoughts and paranoia.      Physical Findings: AIMS: Facial and Oral Movements Muscles of Facial Expression: None, normal Lips and Perioral Area: None, normal Jaw: None, normal Tongue: None, normal,Extremity Movements Upper (arms, wrists, hands, fingers): None, normal Lower (legs, knees, ankles, toes): None, normal, Trunk Movements Neck, shoulders, hips: None, normal, Overall Severity Severity of abnormal movements (highest score from questions above): None, normal Incapacitation due  to abnormal movements: None, normal Patient's awareness of abnormal movements (rate only patient's report): No Awareness, Dental Status Current problems with teeth and/or dentures?: No Does patient usually wear dentures?: No  CIWA:  CIWA-Ar Total: 2 COWS:  COWS Total Score: 4  Musculoskeletal: Strength & Muscle Tone: within normal limits Gait & Station: normal Patient leans: N/A  Psychiatric Specialty Exam:  SEE MD SRA Physical Exam   ROS   Blood pressure 123/87, pulse 78, temperature 97.7 F (36.5 C), temperature source Oral, resp. rate 16, height 5' 11.25" (1.81 m), weight 75.6 kg (166 lb 12 oz), SpO2 100 %.Body mass index is 23.09 kg/m.    Have you used any form of tobacco in the last 30 days? (Cigarettes, Smokeless Tobacco, Cigars, and/or Pipes): Yes  Has this patient used any form of tobacco in the last 30 days? (Cigarettes, Smokeless Tobacco, Cigars, and/or Pipes) Yes, given to patient  Blood Alcohol level:  Lab Results  Component Value Date   ETH <5 07/11/2017   ETH 25 (H) 08/26/2016    Metabolic Disorder Labs:  Lab Results  Component Value Date   HGBA1C 8.5 (H) 09/23/2015   MPG 197 09/23/2015   Lab Results  Component Value Date   PROLACTIN 19.3 (H) 07/11/2017   Lab Results  Component Value Date   CHOL 146 10/17/2015   TRIG 87 10/17/2015   HDL 68 10/17/2015   CHOLHDL 2.1 10/17/2015   VLDL 17 10/17/2015   LDLCALC 61 10/17/2015   LDLCALC 73 09/23/2015    See Psychiatric Specialty Exam and Suicide Risk Assessment completed by Attending Physician prior to discharge.  Discharge destination:  Home  Is patient on multiple antipsychotic therapies at discharge:  No   Has Patient had three or more failed trials of antipsychotic monotherapy by history:  No  Recommended Plan for Multiple Antipsychotic Therapies: NA  Discharge Instructions    Discharge instructions    Complete by:  As directed    Please continue to take medications as directed. If your  symptoms return, worsen, or persist please call your 911, report to local ER, or contact crisis hotline. Please do not drink alcohol or use any illegal substances while taking prescription medications.   Discharge patient    Complete by:  As directed    Discharge disposition:  01-Home or Self Care   Discharge patient date:  07/14/2017     Allergies as of 07/14/2017   No Known Allergies     Medication List    TAKE these medications     Indication  FLUoxetine 40 MG capsule Commonly known as:  PROZAC Take 1 capsule (40 mg total) by mouth daily. What changed:  medication strength  how much to take  Indication:  Depression   hydrOXYzine 10 MG tablet Commonly known as:  ATARAX/VISTARIL Take 1 tablet (10 mg total) by mouth 3 (three) times daily as needed for anxiety (sleep). What changed:  medication strength  how much to take  reasons to take this  Indication:  Feeling Anxious   insulin aspart 100 UNIT/ML injection Commonly known as:  novoLOG Inject 0-10 Units into the skin 3 (three) times daily before meals. 150-200 = 1 unit 201-250 = 2 unit 251-300 = 3 unit 301-350 = 4 unit 351-400 = 5 units What changed:  Another medication with the same name was removed. Continue taking this medication, and follow the directions you see here.  Indication:  Insulin-Dependent Diabetes   Melatonin 3 MG Tabs Take 6 mg by mouth at bedtime as needed (sleep).  Indication:  Trouble Sleeping   nicotine 14 mg/24hr patch Commonly known as:  NICODERM CQ - dosed in mg/24 hours Place 1 patch (14 mg total) onto the skin daily.  Indication:  Nicotine Addiction   TRESIBA FLEXTOUCH [place] Inject 24 Units into the skin at bedtime.  Indication:  diabetes      Follow-up Information    [facility] Follow up on 07/15/2017.   Why:  Follow-up appt for therapy at 3:30PM with Aram Beecham. Thank you.  Contact information: [address] [place], [place] [address] Phone: [phone] Fax:         [facility] Follow up on 07/24/2017.   Why:  Appointment on Wednesday 8/1/18at 10:00AM for evaluation for medication management with [facility]. Your first medication management appointment will be Wednesday 9/5 at 11:00AM with Garth Schlatter. Please call at discharge to make $20 deposit. Thank you. Contact information: [address] [place] [address] [phone]        [facility] Follow up on 07/18/2017.   Why:  Hospital follow-up appt for medication management on Thursday at 11:00AM. After this appt, you will be seeing a psychiatrist at  [facility] for psychiatric medication management.  Contact information: ATTN: Ephriam KnucklesJennifer Chapman [address] [place], [place] [address] Phone: [phone] Fax: [phone]         Follow-up recommendations:  Activity:  as tol Diet:  as tol  Comments:  1.  Take all your medications as prescribed.   2.  Report any adverse side effects to outpatient provider. 3.  Patient instructed to not use alcohol or illegal drugs while on prescription medicines. 4.  In the event of worsening symptoms, instructed patient to call 911, the crisis hotline or go to nearest emergency room for evaluation of symptoms.  Signed: Truman Haywardakia S Starkes, FNP [facility] 07/14/2017, 1:19 PM

## 2017-07-14 NOTE — Progress Notes (Signed)
Patient ID: Micheal SongDevon Lopez, male   DOB: 1986/06/26, 31 y.o.   MRN: [id number]  Patient discharged per MD orders. Patient given education regarding follow-up appointments and medications. Patient denies any questions or concerns about these instructions. Patient was escorted to locker and given belongings before discharge to hospital lobby. Patient currently denies SI/HI and auditory and visual hallucinations on discharge.

## 2017-07-14 NOTE — [facility] Suicide Risk Assessment (Addendum)
[facility]   Principal Problem: Major depressive disorder, recurrent severe without psychotic features [facility](HCC) Discharge Diagnoses:  Patient Active Problem List   Diagnosis Date Noted  . MDD (major depressive disorder), recurrent severe, without psychosis (HCC) [F33.2] 07/11/2017  . Alcohol abuse [F10.10]   . Alcohol abuse with alcohol-induced mood disorder (HCC) [F10.14] 08/23/2016  . Major depressive disorder, recurrent severe without psychotic features (HCC) [F33.2] 07/31/2016  . Alcohol intoxication (HCC) [F10.929]   . Substance induced mood disorder (HCC) [F19.94] 09/22/2015  . Alcohol use disorder, severe, dependence (HCC) [F10.20] 09/21/2015  . Suicidal ideation [R45.851]     Total Time spent with patient: 45 minutes  Musculoskeletal: Strength & Muscle Tone: within normal limits Gait & Station: normal Patient leans: N/A  Psychiatric Specialty Exam: Review of Systems  Constitutional: Negative.   HENT: Negative.   Eyes: Negative.   Respiratory: Negative.   Cardiovascular: Negative.   Gastrointestinal: Negative.   Genitourinary: Negative.   Musculoskeletal: Negative.   Skin: Negative.   Neurological: Negative.   Endo/Heme/Allergies: Negative.   Psychiatric/Behavioral: Negative for depression, hallucinations, memory loss and suicidal ideas. The patient is not nervous/anxious and does not have insomnia.     Blood pressure 123/87, pulse 78, temperature 97.7 F (36.5 C), temperature source Oral, resp. rate 16, height 5' 11.25" (1.81 m), weight 75.6 kg (166 lb 12 oz), SpO2 100 %.Body mass index is 23.09 kg/m.  General Appearance: Neatly dressed, pleasant, engaging well and cooperative. Appropriate behavior. Not in any distress. Good relatedness. Not internally stimulated  Eye Contact::  Good  Speech:  Spontaneous, normal prosody. Normal tone and rate.   Volume:  Normal  Mood:  Euthymic  Affect:  Appropriate and Full Range  Thought Process:  Goal  Directed and Linear  Orientation:  Full (Time, Place, and Person)  Thought Content:  Future oriented. No delusional theme. No preoccupation with violent thoughts. No negative ruminations. No obsession.  No hallucination in any modality.   Suicidal Thoughts:  No  Homicidal Thoughts:  No  Memory:  Immediate;   Good Recent;   Good Remote;   Good  Judgement:  Good  Insight:  Good  Psychomotor Activity:  Normal  Concentration:  Good  Recall:  Good  Fund of Knowledge:Good  Language: Good  Akathisia:  Negative  Handed:    AIMS (if indicated):     Assets:  Communication Skills Desire for Improvement Financial Resources/Insurance Housing Leisure Time Physical Health Resilience Social Support Talents/Skills Transportation Vocational/Educational  Sleep:  Number of Hours: 6.75  Cognition: WNL  ADL's:  Intact   Clinical Assessment::   31 yo Caucasian male, divorced, lives with his mother, employed. Known history of SUD, MDD, IDDM and head injury. Self presented for voluntary admission. Recently found out that his girlfriend was having an affair. Felt homicidal towards her. Had thoughts suicide.  Came in so as to get his thoughts straight. Some depression in reaction to recent events.  Seen today. Patient has been able to come to terms with recent end of his relationship. Says "it hurts but it is what it is ,,,,,, I am ready to move on". Says he is no longer dwelling on his recent loss. No violent thoughts towards his ex-girlfriend or any other person. No suicidal thoughts. He has not been feeling down. He has been able to enjoy activities here. He is able to engage well with people. He has been eating normally and sleeps well at night. Patient is looking forward to be with  his son today and go back to work on Monday. No evidence of rage. No evidence of psychosis. No evidence of mania. No overwhelming anxiety. No access to weapons. Has good support from his mom.   Nursing staff reports that  patient has been appropriate on the unit. Patient has been interacting well with peers. No behavioral issues. Patient has not voiced any suicidal thoughts. Patient has not been observed to be internally stimulated. Patient has been adherent with treatment recommendations. Patient has been tolerating their medication well.   Patient was discussed at team. Team members feels that patient is back to his baseline level of function. Team agrees with plan to discharge patient today.   Demographic Factors:  Male, Divorced or widowed and Caucasian  Loss Factors: Loss of significant relationship  Historical Factors: Prior suicide attempts and Impulsivity  Risk Reduction Factors:   Responsible for children under 65 years of age, Sense of responsibility to family, Religious beliefs about death, Employed, Living with another person, especially a relative, Positive social support, Positive therapeutic relationship and Positive coping skills or problem solving skills  Continued Clinical Symptoms:   as above   Cognitive Features That Contribute To Risk:  None    Suicide Risk:  Minimal: No identifiable suicidal ideation. Patient is not having any thoughts of suicide at this time. Modifiable risk factors targeted during this admission includes depression, adjustment disorder, suicidal and homicidal thoughts.  Demographical and historical risk factors cannot be modified. Patient is now engaging well. Patient is reliable and is future oriented. We have buffered patient's support structures. At this point, patient is at low risk of suicide. Patient is aware of the effects of psychoactive substances on decision making process. Patient has been provided with emergency contacts. Patient acknowledges to use resources provided if unforseen circumstances changes their current risk stratification.    Follow-up Information    [facility] Follow up on 07/15/2017.   Why:  Follow-up appt for therapy at 3:30PM  with Aram Beecham. Thank you.  Contact information: [address] [place], [place] [address] Phone: [phone] Fax:        [facility] Follow up on 07/24/2017.   Why:  Appointment on Wednesday 8/1/18at 10:00AM for evaluation for medication management with [facility]. Your first medication management appointment will be Wednesday 9/5 at 11:00AM with Garth Schlatter. Please call at discharge to make $20 deposit. Thank you. Contact information: [address] [place] [address] [phone]        [facility] Follow up on 07/18/2017.   Why:  Hospital follow-up appt for medication management on Thursday at 11:00AM. After this appt, you will be seeing a psychiatrist at [facility] for psychiatric medication management.  Contact information: ATTN: Ephriam Knuckles [address] [place] [address] Phone: [phone] Fax: [phone]          Plan Of Care/Follow-up recommendations:  1. Continue current psychotropic medications 2. Mental health and addiction follow up as arranged.  3. Discharge in care of his family 4. Provided limited quantity of prescriptions   Georgiann Cocker, MD 07/14/2017, 9:45 AM

## 2017-07-17 LAB — HEMOGLOBIN A1C
HEMOGLOBIN A1C: 7.7 % — AB (ref 4.8–5.6)
Mean Plasma Glucose: 174 mg/dL

## 2017-09-18 ENCOUNTER — Encounter (HOSPITAL_BASED_OUTPATIENT_CLINIC_OR_DEPARTMENT_OTHER): Payer: Self-pay | Admitting: Emergency Medicine

## 2017-09-18 ENCOUNTER — Emergency Department (HOSPITAL_BASED_OUTPATIENT_CLINIC_OR_DEPARTMENT_OTHER): Payer: Managed Care, Other (non HMO)

## 2017-09-18 ENCOUNTER — Emergency Department (HOSPITAL_BASED_OUTPATIENT_CLINIC_OR_DEPARTMENT_OTHER)
Admission: EM | Admit: 2017-09-18 | Discharge: 2017-09-18 | Disposition: A | Discharge status: Home / Self Care | Payer: Managed Care, Other (non HMO) | Attending: Emergency Medicine | Admitting: Emergency Medicine

## 2017-09-18 DIAGNOSIS — J209 Acute bronchitis, unspecified: Secondary | ICD-10-CM | POA: Diagnosis not present

## 2017-09-18 DIAGNOSIS — R0602 Shortness of breath: Secondary | ICD-10-CM | POA: Diagnosis present

## 2017-09-18 DIAGNOSIS — F17210 Nicotine dependence, cigarettes, uncomplicated: Secondary | ICD-10-CM | POA: Insufficient documentation

## 2017-09-18 DIAGNOSIS — Z79899 Other long term (current) drug therapy: Secondary | ICD-10-CM | POA: Diagnosis not present

## 2017-09-18 DIAGNOSIS — Z720 Tobacco use: Secondary | ICD-10-CM

## 2017-09-18 MED ORDER — IPRATROPIUM-ALBUTEROL 0.5-2.5 (3) MG/3ML IN SOLN
3.0000 mL | Freq: Four times a day (QID) | RESPIRATORY_TRACT | Status: DC
Start: 2017-09-18 — End: 2017-09-18
  Administered 2017-09-18: 3 mL via RESPIRATORY_TRACT
  Filled 2017-09-18: qty 3

## 2017-09-18 MED ORDER — ALBUTEROL SULFATE HFA 108 (90 BASE) MCG/ACT IN AERS: 1.0000 | INHALATION_SPRAY | Freq: Four times a day (QID) | RESPIRATORY_TRACT | 0 refills | Status: DC | PRN

## 2017-09-18 MED ORDER — PREDNISONE 50 MG PO TABS: ORAL_TABLET | ORAL | 0 refills | Status: DC

## 2017-09-18 MED ORDER — PREDNISONE 50 MG PO TABS
60.0000 mg | ORAL_TABLET | Freq: Once | ORAL | Status: AC
  Administered 2017-09-18: 60 mg via ORAL
  Filled 2017-09-18: qty 1

## 2017-09-18 MED ORDER — AZITHROMYCIN 250 MG PO TABS: 250.0000 mg | ORAL_TABLET | Freq: Every day | ORAL | 0 refills | Status: DC

## 2017-09-18 MED FILL — AZITHROMYCIN 250 MG TABLET: 250 | 5 days supply | Qty: 6 | Fill #0

## 2017-09-18 MED FILL — predniSONE 50 MG TABS: 50 | 5 days supply | Qty: 5 | Fill #0

## 2017-09-18 NOTE — Discharge Instructions (Signed)
Please follow with your primary care doctor in the next 2 days for a check-up. They must obtain records for further management.  ° °Do not hesitate to return to the Emergency Department for any new, worsening or concerning symptoms.  ° °

## 2017-09-18 NOTE — ED Provider Notes (Signed)
[facility] Provider Note   CSN: [id number] Arrival date & time: 09/18/17  1045     History   Chief Complaint Chief Complaint  Patient presents with  . Shortness of Breath     HPI   Blood pressure 123/76, pulse 83, temperature 98.1 F (36.7 C), temperature source Oral, resp. rate 19, height  (1.803 m), weight 76.2 kg (168 lb), SpO2 97 %.  Micheal Lopez is a 31 y.o. male complaining of intermittently productive cough with associated rhinorrhea onset 1 week ago developing pleuritic chest pain and shortness of breath over the last several days. No chest pain at rest. States it's mild. He has no formal diagnosis of asthma but states that he has been wheezing. He denies fever, chills, nausea, vomiting, change in bowel or bladder habits, history of DVT/PE, recent immobilizations, calf pain, leg swelling. He has multiple sick contacts are sick with upper respiratory and cough. He is a active daily smoker.  Past Medical History:  Diagnosis Date  . Depression    per pt.  . Diabetes mellitus without complication (HCC)    Type I    Patient Active Problem List   Diagnosis Date Noted  . 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 Alcohol abuse   . Alcohol abuse with alcohol-induced mood disorder (HCC) 08/23/2016  . Major depressive disorder, recurrent severe without psychotic features (HCC) 07/31/2016  . Alcohol intoxication (HCC)   . Substance induced mood disorder (HCC) 09/22/2015  . Alcohol use disorder, severe, dependence (HCC) 09/21/2015  . Suicidal ideation     History reviewed. No pertinent surgical history.     Home Medications    Prior to Admission medications   Medication Sig Start Date End Date Taking? Authorizing Provider  albuterol (PROVENTIL HFA;VENTOLIN HFA) 108 (90 Base) MCG/ACT inhaler Inhale 1-2 puffs into the lungs every 6 (six) hours as needed for wheezing or shortness of breath. 09/18/17    Sanii Kukla, Joni Reining, PA-C  azithromycin (ZITHROMAX Z-PAK) 250 MG tablet Take 1 tablet (250 mg total) by mouth daily.  PO day 1, then  PO days 205 09/18/17   Jessice Madill, Joni Reining, PA-C  FLUoxetine (PROZAC) 40 MG capsule Take 1 capsule (40 mg total) by mouth daily. 07/15/17   Truman Hayward, FNP  hydrOXYzine (ATARAX/VISTARIL) 10 MG tablet Take 1 tablet (10 mg total) by mouth 3 (three) times daily as needed for anxiety (sleep). 07/14/17   Truman Hayward, FNP  insulin aspart (NOVOLOG) 100 UNIT/ML injection Inject 0-10 Units into the skin 3 (three) times daily before meals. 150-200 = 1 unit 201-250 = 2 unit 251-300 = 3 unit 301-350 = 4 unit 351-400 = 5 units    [provider]  Insulin Degludec (TRESIBA FLEXTOUCH [place]) Inject 24 Units into the skin at bedtime.    [provider]  Melatonin 3 MG TABS Take 6 mg by mouth at bedtime as needed (sleep).    [provider]  nicotine (NICODERM CQ - DOSED IN MG/24 HOURS) 14 mg/24hr patch Place 1 patch (14 mg total) onto the skin daily. 07/14/17   Truman Hayward, FNP  predniSONE (DELTASONE) 50 MG tablet Take 1 tablet daily with breakfast 09/18/17   Jaycee Pelzer, Joni Reining, PA-C    Family History Family History  Problem Relation Age of Onset  . Cancer Maternal Grandmother        BREAST     Social History Social History  Substance Use Topics  . Smoking status: Current Every  Day Smoker    Packs/day: 1.00    Types: E-cigarettes, Cigarettes  . Smokeless tobacco: Never Used  . Alcohol use Yes     Comment: binge drinking     Allergies   Patient has no known allergies.   Review of Systems Review of Systems  A complete review of systems was obtained and all systems are negative except as noted in the HPI and PMH.    Physical Exam Updated Vital Signs BP 118/73   Pulse 81   Temp 98.1 F (36.7 C) (Oral)   Resp 17   Ht  (1.803 m)   Wt 76.2 kg (168 lb)   SpO2 100%   BMI 23.43 kg/m   Physical Exam    Constitutional: He is oriented to person, place, and time. He appears well-developed and well-nourished. No distress.  HENT:  Head: Normocephalic.  Right Ear: External ear normal.  Left Ear: External ear normal.  Mouth/Throat: Oropharynx is clear and moist. No oropharyngeal exudate.  No drooling or stridor. Posterior pharynx mildly erythematous no significant tonsillar hypertrophy. No exudate. Soft palate rises symmetrically. No TTP or induration under tongue.   No tenderness to palpation of frontal or bilateral maxillary sinuses.  Mild mucosal edema in the nares with scant rhinorrhea.  Bilateral tympanic membranes with normal architecture and good light reflex.    Eyes: Pupils are equal, round, and reactive to light. Conjunctivae and EOM are normal.  Neck: Normal range of motion. Neck supple. No JVD present. No tracheal deviation present.  Cardiovascular: Normal rate, regular rhythm and intact distal pulses.   Radial pulse equal bilaterally  Pulmonary/Chest: Effort normal and breath sounds normal. No stridor. No respiratory distress. He has no wheezes. He has no rales. He exhibits no tenderness.  Abdominal: Soft. He exhibits no distension and no mass. There is no tenderness. There is no rebound and no guarding.  Musculoskeletal: Normal range of motion. He exhibits no edema or tenderness.  No calf asymmetry, superficial collaterals, palpable cords, edema, Homans sign negative bilaterally.    Neurological: He is alert and oriented to person, place, and time.  Skin: Skin is warm. He is not diaphoretic.  Psychiatric: He has a normal mood and affect.  Nursing note and vitals reviewed.    ED Treatments / Results  Labs (all labs ordered are listed, but only abnormal results are displayed) Labs Reviewed - No data to display  EKG  EKG Interpretation None       Radiology Dg Chest 2 View  Result Date: 09/18/2017 CLINICAL DATA:  Cough for 1 week.  Shortness of breath for 1 day  EXAM: CHEST  2 VIEW COMPARISON:  None. FINDINGS: There is no edema or consolidation. The heart size and pulmonary vascularity are normal. No adenopathy. There is lower thoracic dextroscoliosis. IMPRESSION: No edema or consolidation. Electronically Signed   By: Bretta Bang III M.D.   On: 09/18/2017 11:30    Procedures Procedures (including critical care time)  Medications Ordered in ED Medications  ipratropium-albuterol (DUONEB) 0.5-2.5 (3) MG/3ML nebulizer solution 3 mL (3 mLs Nebulization Given 09/18/17 1151)  predniSONE (DELTASONE) tablet 60 mg (60 mg Oral Given 09/18/17 1156)     Initial Impression / Assessment and Plan / ED Course  I have reviewed the triage vital signs and the nursing notes.  Pertinent labs & imaging results that were available during my care of the patient were reviewed by me and considered in my medical decision making (see chart for details).  Vitals:   09/18/17 1051 09/18/17 1104 09/18/17 1151 09/18/17 1155  BP: 130/75 123/76  118/73  Pulse: (!) 106 83  81  Resp: Temp: 98.1 F (36.7 C)     TempSrc: Oral     SpO2: 100% 97% 100% 100%  Weight: 76.2 kg (168 lb)     Height:  (1.803 m)       Medications  ipratropium-albuterol (DUONEB) 0.5-2.5 (3) MG/3ML nebulizer solution 3 mL (3 mLs Nebulization Given 09/18/17 1151)  predniSONE (DELTASONE) tablet 60 mg (60 mg Oral Given 09/18/17 1156)    Micheal Lopez is 31 y.o. male presenting with Rhinorrhea, cough, shortness of breath and pleuritic chest pain worsening over the course of the week. Chest pain not consistent with ACS, doubt PE given onset of pain only after he been coughing for several days and lack of physical exam findings consistent with a VT. Likely acute bronchitis, chest x-ray without infiltrate. Patient will be started on prednisone and given a DuoNeb in the ED.  Chest x-ray without infiltrate, patient feels much better after nebulizer treatment. Will continue prednisone at  home, start Z-Pak and albuterol inhaler, counseled on smoking cessation. Work note provided.  Evaluation does not show pathology that would require ongoing emergent intervention or inpatient treatment. Pt is hemodynamically stable and mentating appropriately. Discussed findings and plan with patient/guardian, who agrees with care plan. All questions answered. Return precautions discussed and outpatient follow up given.    Final Clinical Impressions(s) / ED Diagnoses   Final diagnoses:  Acute bronchitis, unspecified organism  Tobacco use    New Prescriptions New Prescriptions   ALBUTEROL (PROVENTIL HFA;VENTOLIN HFA) 108 (90 BASE) MCG/ACT INHALER    Inhale 1-2 puffs into the lungs every 6 (six) hours as needed for wheezing or shortness of breath.   AZITHROMYCIN (ZITHROMAX Z-PAK) 250 MG TABLET    Take 1 tablet (250 mg total) by mouth daily.  PO day 1, then  PO days 205   PREDNISONE (DELTASONE) 50 MG TABLET    Take 1 tablet daily with breakfast     Wynetta Emery, PA-C 09/18/17 1235    Vanetta Mulders, MD 09/19/17 [phone]

## 2017-09-18 NOTE — ED Triage Notes (Signed)
Reports shortness of breath which began yesterday.

## 2018-03-05 IMAGING — CR DG CHEST 2V
2 series · 2 of 2 positions shown · non-contrast
Comparison: None.

CLINICAL DATA: Cough for 1 week.  Shortness of breath for 1 day

EXAM:
CHEST  2 VIEW

[w chest pa]
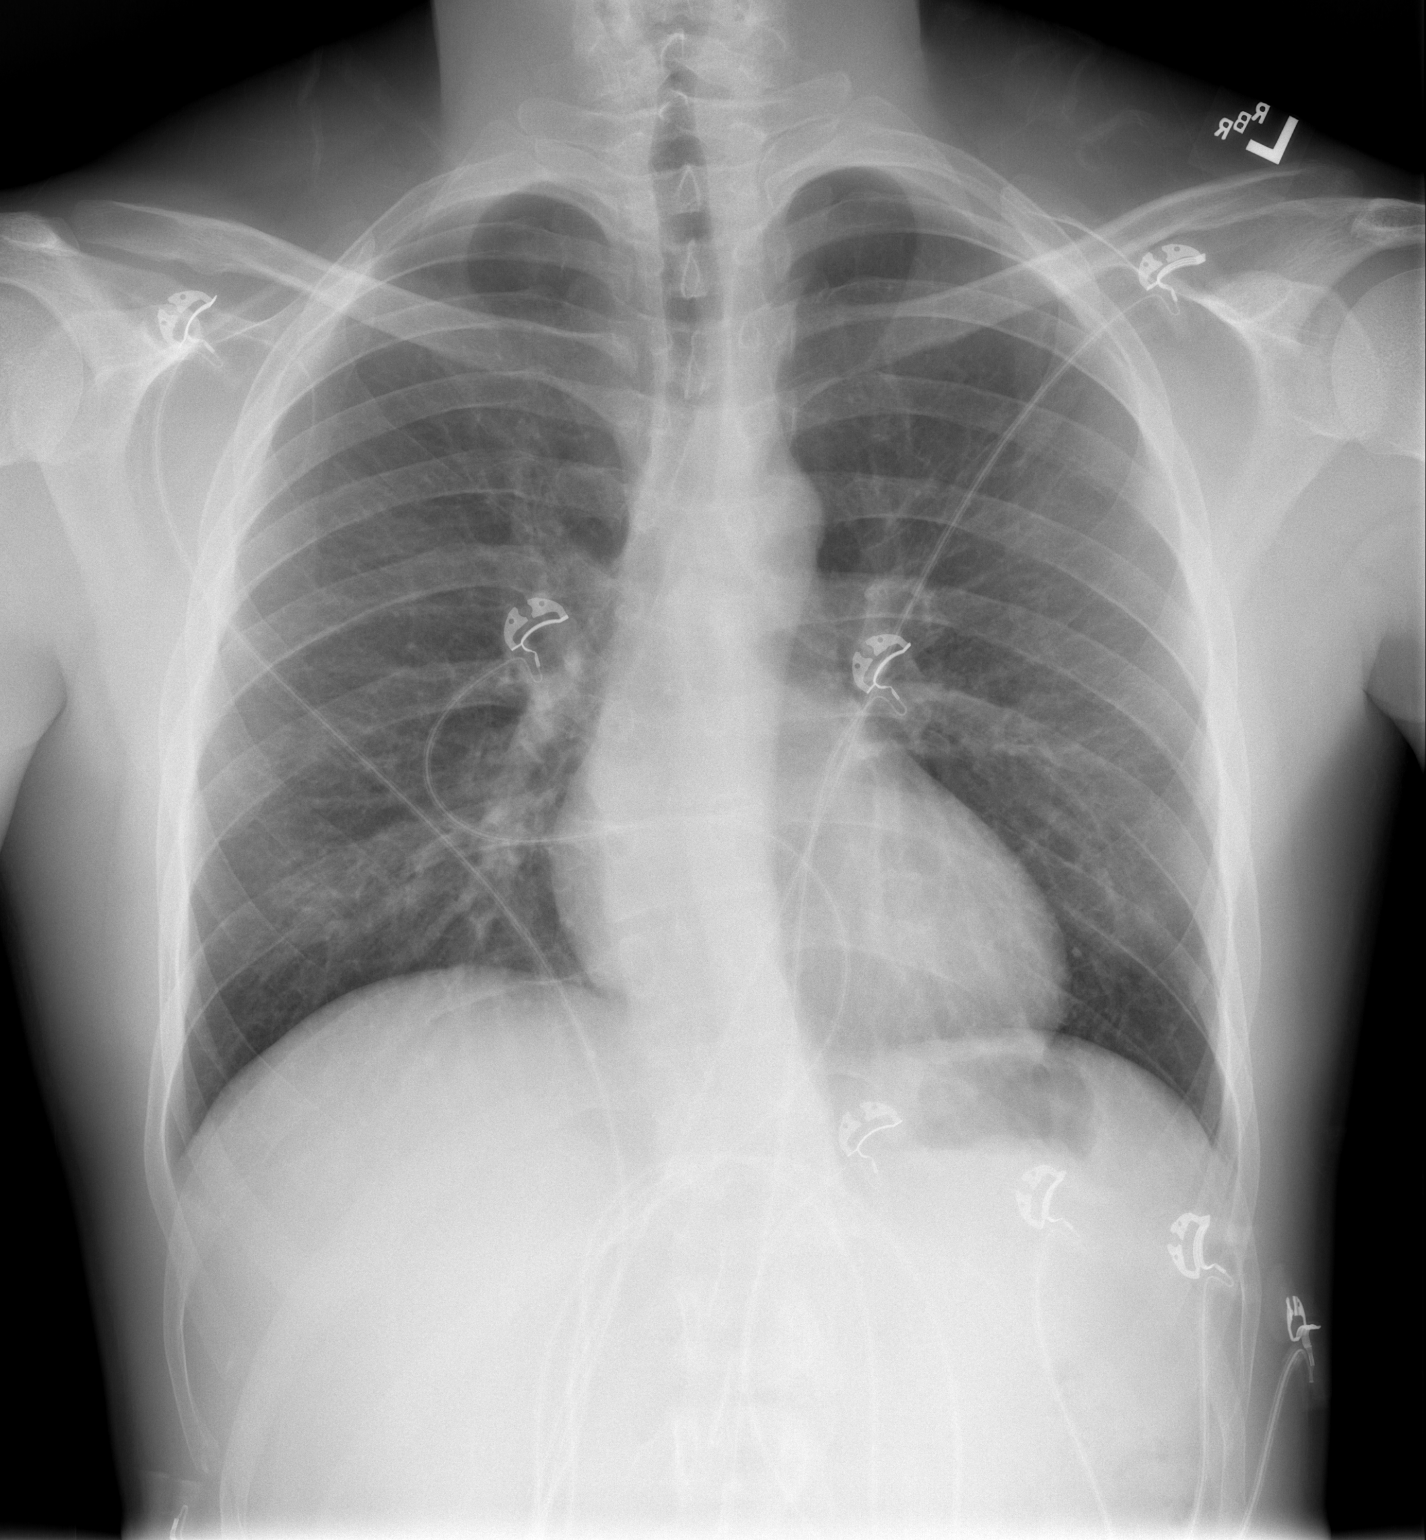

[w chest lat]
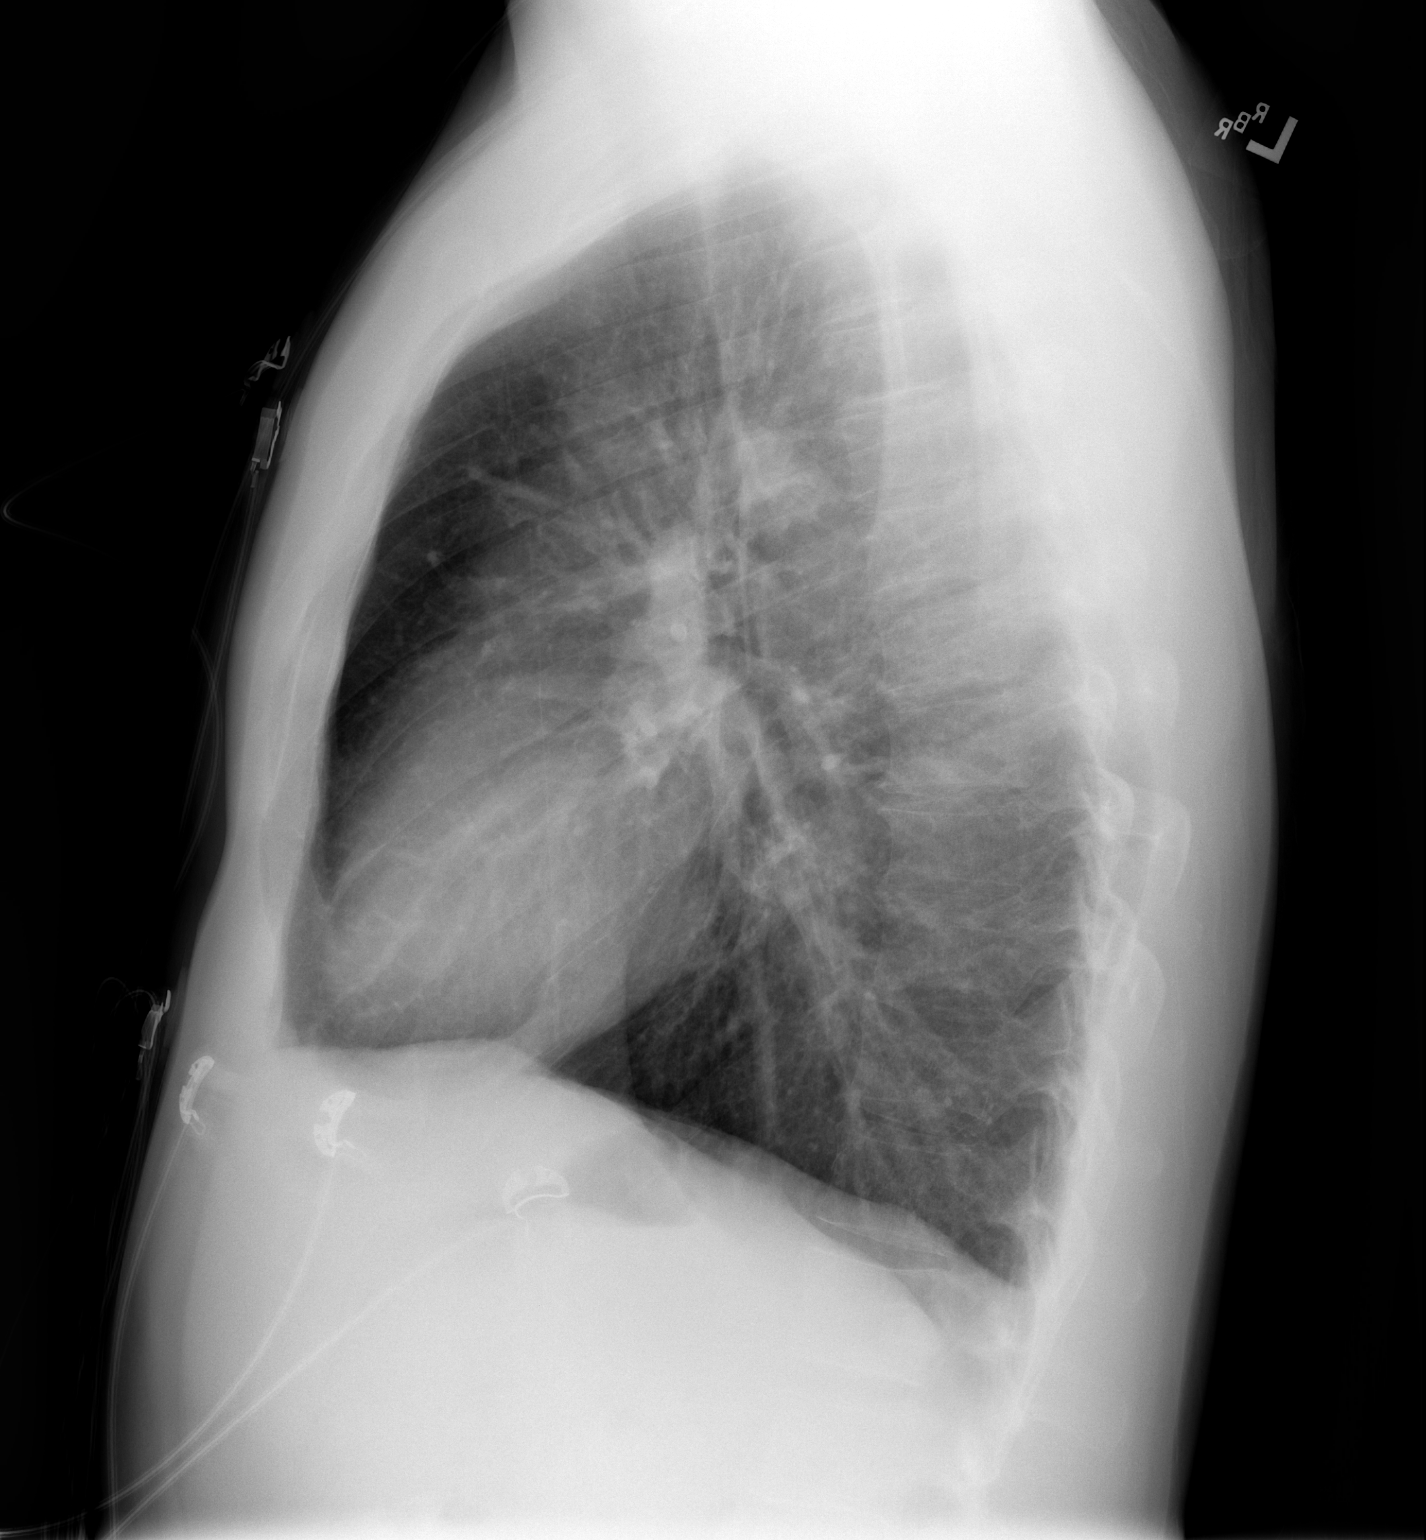

[2 of 2 positions shown; findings below may reference images not displayed]

FINDINGS: There is no edema or consolidation. The heart size and pulmonary
vascularity are normal. No adenopathy. There is lower thoracic
dextroscoliosis.
IMPRESSION: No edema or consolidation.

## 2018-04-22 ENCOUNTER — Encounter (HOSPITAL_COMMUNITY): Payer: Self-pay | Admitting: Nurse Practitioner

## 2018-04-22 ENCOUNTER — Other Ambulatory Visit: Payer: Self-pay

## 2018-04-22 ENCOUNTER — Inpatient Hospital Stay (HOSPITAL_COMMUNITY)
Admission: RE | Admit: 2018-04-22 | Discharge: 2018-04-25 | DRG: 885 | Disposition: A | Discharge status: Home / Self Care | Payer: BLUE CROSS/BLUE SHIELD | Attending: Psychiatry | Admitting: Psychiatry

## 2018-04-22 DIAGNOSIS — F1490 Cocaine use, unspecified, uncomplicated: Secondary | ICD-10-CM | POA: Diagnosis present

## 2018-04-22 DIAGNOSIS — Z560 Unemployment, unspecified: Secondary | ICD-10-CM

## 2018-04-22 DIAGNOSIS — F1210 Cannabis abuse, uncomplicated: Secondary | ICD-10-CM | POA: Diagnosis present

## 2018-04-22 DIAGNOSIS — Z811 Family history of alcohol abuse and dependence: Secondary | ICD-10-CM | POA: Diagnosis not present

## 2018-04-22 DIAGNOSIS — Z79899 Other long term (current) drug therapy: Secondary | ICD-10-CM

## 2018-04-22 DIAGNOSIS — Z794 Long term (current) use of insulin: Secondary | ICD-10-CM | POA: Diagnosis not present

## 2018-04-22 DIAGNOSIS — R45851 Suicidal ideations: Secondary | ICD-10-CM

## 2018-04-22 DIAGNOSIS — F17210 Nicotine dependence, cigarettes, uncomplicated: Secondary | ICD-10-CM | POA: Diagnosis present

## 2018-04-22 DIAGNOSIS — R4587 Impulsiveness: Secondary | ICD-10-CM

## 2018-04-22 DIAGNOSIS — E109 Type 1 diabetes mellitus without complications: Secondary | ICD-10-CM | POA: Diagnosis present

## 2018-04-22 DIAGNOSIS — Z915 Personal history of self-harm: Secondary | ICD-10-CM

## 2018-04-22 DIAGNOSIS — Z9641 Presence of insulin pump (external) (internal): Secondary | ICD-10-CM | POA: Diagnosis present

## 2018-04-22 DIAGNOSIS — Z7952 Long term (current) use of systemic steroids: Secondary | ICD-10-CM

## 2018-04-22 DIAGNOSIS — Z803 Family history of malignant neoplasm of breast: Secondary | ICD-10-CM

## 2018-04-22 DIAGNOSIS — F419 Anxiety disorder, unspecified: Secondary | ICD-10-CM | POA: Diagnosis present

## 2018-04-22 DIAGNOSIS — G4700 Insomnia, unspecified: Secondary | ICD-10-CM | POA: Diagnosis present

## 2018-04-22 DIAGNOSIS — F319 Bipolar disorder, unspecified: Secondary | ICD-10-CM | POA: Diagnosis present

## 2018-04-22 DIAGNOSIS — J45909 Unspecified asthma, uncomplicated: Secondary | ICD-10-CM | POA: Diagnosis present

## 2018-04-22 DIAGNOSIS — F3132 Bipolar disorder, current episode depressed, moderate: Secondary | ICD-10-CM | POA: Diagnosis not present

## 2018-04-22 DIAGNOSIS — F17290 Nicotine dependence, other tobacco product, uncomplicated: Secondary | ICD-10-CM | POA: Diagnosis present

## 2018-04-22 LAB — URINALYSIS, COMPLETE (UACMP) WITH MICROSCOPIC
BACTERIA UA: NONE SEEN
Bilirubin Urine: NEGATIVE
Hgb urine dipstick: NEGATIVE
Ketones, ur: 5 mg/dL — AB
Leukocytes, UA: NEGATIVE
Nitrite: NEGATIVE
PH: 6 (ref 5.0–8.0)
Protein, ur: NEGATIVE mg/dL
SPECIFIC GRAVITY, URINE: 1.008 (ref 1.005–1.030)

## 2018-04-22 LAB — RAPID URINE DRUG SCREEN, HOSP PERFORMED
AMPHETAMINES: POSITIVE — AB
BARBITURATES: NOT DETECTED
BENZODIAZEPINES: NOT DETECTED
Cocaine: NOT DETECTED
Opiates: NOT DETECTED
TETRAHYDROCANNABINOL: POSITIVE — AB

## 2018-04-22 LAB — GLUCOSE, CAPILLARY
GLUCOSE-CAPILLARY: 164 mg/dL — AB (ref 65–99)
Glucose-Capillary: 337 mg/dL — ABNORMAL HIGH (ref 65–99)
Glucose-Capillary: 329 mg/dL — ABNORMAL HIGH (ref 65–99)

## 2018-04-22 MED ORDER — BUPROPION HCL ER (XL) 300 MG PO TB24
300.0000 mg | ORAL_TABLET | Freq: Every day | ORAL | Status: DC
  Administered 2018-04-23 – 2018-04-25 (×3): 300 mg via ORAL
  Filled 2018-04-22 (×6): qty 1

## 2018-04-22 MED ORDER — LITHIUM CARBONATE 300 MG PO CAPS
300.0000 mg | ORAL_CAPSULE | Freq: Two times a day (BID) | ORAL | Status: DC
  Administered 2018-04-22 – 2018-04-23 (×2): 300 mg via ORAL
  Filled 2018-04-22 (×8): qty 1

## 2018-04-22 MED ORDER — INSULIN ASPART 100 UNIT/ML ~~LOC~~ SOLN
4.0000 [IU] | Freq: Three times a day (TID) | SUBCUTANEOUS | Status: DC
  Administered 2018-04-22 – 2018-04-23 (×3): 4 [IU] via SUBCUTANEOUS

## 2018-04-22 MED ORDER — INSULIN ASPART 100 UNIT/ML ~~LOC~~ SOLN
0.0000 [IU] | Freq: Three times a day (TID) | SUBCUTANEOUS | Status: DC
  Administered 2018-04-22 – 2018-04-23 (×2): 7 [IU] via SUBCUTANEOUS
  Administered 2018-04-23: 5 [IU] via SUBCUTANEOUS
  Administered 2018-04-24: 3 [IU] via SUBCUTANEOUS
  Administered 2018-04-24: 1 [IU] via SUBCUTANEOUS
  Administered 2018-04-24 – 2018-04-25 (×2): 5 [IU] via SUBCUTANEOUS
  Administered 2018-04-25: 2 [IU] via SUBCUTANEOUS

## 2018-04-22 MED ORDER — INSULIN ASPART 100 UNIT/ML ~~LOC~~ SOLN
0.0000 [IU] | Freq: Every day | SUBCUTANEOUS | Status: DC
  Administered 2018-04-22: 4 [IU] via SUBCUTANEOUS
  Administered 2018-04-24: 2 [IU] via SUBCUTANEOUS

## 2018-04-22 MED ORDER — ALBUTEROL SULFATE HFA 108 (90 BASE) MCG/ACT IN AERS
1.0000 | INHALATION_SPRAY | Freq: Four times a day (QID) | RESPIRATORY_TRACT | Status: DC | PRN
Start: 2018-04-22 — End: 2018-04-25

## 2018-04-22 MED ORDER — INSULIN GLARGINE 100 UNIT/ML ~~LOC~~ SOLN
26.0000 [IU] | Freq: Every day | SUBCUTANEOUS | Status: DC
  Administered 2018-04-22 – 2018-04-24 (×3): 26 [IU] via SUBCUTANEOUS

## 2018-04-22 MED ORDER — MELATONIN 3 MG PO TABS
6.0000 mg | ORAL_TABLET | Freq: Every evening | ORAL | Status: DC | PRN
  Administered 2018-04-22: 6 mg via ORAL
  Filled 2018-04-22 (×2): qty 2

## 2018-04-22 MED ORDER — HYDROXYZINE HCL 25 MG PO TABS
25.0000 mg | ORAL_TABLET | Freq: Three times a day (TID) | ORAL | Status: DC | PRN
  Administered 2018-04-22: 25 mg via ORAL
  Filled 2018-04-22: qty 1

## 2018-04-22 MED ORDER — FLUOXETINE HCL 20 MG PO CAPS
40.0000 mg | ORAL_CAPSULE | Freq: Every day | ORAL | Status: DC
  Filled 2018-04-22 (×2): qty 2

## 2018-04-22 MED ORDER — PNEUMOCOCCAL VAC POLYVALENT 25 MCG/0.5ML IJ INJ: 0.5000 mL | INJECTION | INTRAMUSCULAR | Status: DC

## 2018-04-22 MED ORDER — NICOTINE 14 MG/24HR TD PT24
14.0000 mg | MEDICATED_PATCH | Freq: Every day | TRANSDERMAL | Status: DC
  Administered 2018-04-22 – 2018-04-25 (×4): 14 mg via TRANSDERMAL
  Filled 2018-04-22 (×7): qty 1

## 2018-04-22 MED ORDER — HYDROXYZINE HCL 10 MG PO TABS: 10.0000 mg | ORAL_TABLET | Freq: Three times a day (TID) | ORAL | Status: DC | PRN

## 2018-04-22 NOTE — Progress Notes (Addendum)
Patient ID: Micheal Lopez, male   DOB: 07/15/1986, 32 y.o.   MRN: [id number]  Patient is a 32 year old male admitted voluntarily as a walk in to [facility].  Pt reports worsening depression and +SI over the past couple of weeks.  Pt reports anhedonia, anxiety, decreased appetite, decreased concentration, irritability, crying spells, isolation, loneliness, insomnia and feelings of worthlessness.  Pt reports his stressors as the financial in nature and reports that he lost his job in November 2018, and has not been able to find any other job since then.  Pt denies having any access to firearms, reports currently feeling suicidal, and denies having a plan.  Pt denies AVH/HI, verbally contracts for safety on the unit. Q15 minute checks initiated for safety. Pt reports that he sees a Therapist, [facility] [place], states that he takes his medications as scheduled.  Pt reports a diagnoses of bipolar disorder and ADHD, and states that the ADHD "has gone untreated", and states that he will like to be treated for it.   Pt reports daily marijuana use, states that he uses "a couple bowls daily", reports occasional cocaine use, and states that he drinks alcohol "socially".  Pt educated on the implications of substance abuse in the scenario of Diabetes, and verbalizes understanding.    Pt reports a medical diagnosis of Diabetes, arrived unit with insulin pump, which is being kept in off unit locker, and was educated by the Pharmacist that his Diabetes will be controlled via sliding scale while at [facility], and is agreeable.  Pt given unit handbook, and educated on unit protocols, verbalizes understanding.  Report given to pt's assigned RN.

## 2018-04-22 NOTE — Progress Notes (Signed)
EKG completed and reviewed with A Nwoko, NP and Dr. Jama Flavors. Filed in paper chart. EKGs currently not uploading to computer. Biomed notified and working on issue.

## 2018-04-22 NOTE — Progress Notes (Signed)
  DATA ACTION RESPONSE  Objective- Pt. is visible in the dayroom, no interaction. Pt stays to himself and is quiet.Presents with an anxious/sad affect and mood. CBG was 329 at bedtime. Subjective- Denies having any SI/HI/AVH/Pain at this time. Pt was apprehensive about diagnosis. Pt was requesting more information about diagnosis.Pt is cooperative and remains safe on the unit.  1:1 interaction in private to establish rapport. Encouragement, education, & support given from staff.  PRN melatonin requested and will re-eval accordingly.   Safety maintained with Q 15 checks. Continue with POC.
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
## 2018-04-22 NOTE — Progress Notes (Signed)
Adult Psychoeducational Group Note  Date:  04/22/2018 Time: 1620  Group Topic/Focus:  Coping With Mental Health Crisis:  Group engaged in a game of BINGO and discussed coping skils. The purpose of this group is to help patients identify strategies for coping with mental health crisis.   Participation Level:  Active  Participation Quality:  Appropriate  Affect:  Appropriate  Cognitive:  Appropriate  Insight: Appropriate  Engagement in Group:  Engaged  Modes of Intervention:  Discussion  Additional Comments:    Micheal Lopez L 04/22/2018  

## 2018-04-22 NOTE — Progress Notes (Signed)
Shortly before 1600, patient came to staff concerned that his blood sugar could be high. "I discontinued my pump at 1000 this morning." Asking that it be checked. CBG performed and was "167." Discussed with A Nwoko, NP who states patient is to be given 4 units meal coverage prior to dinner and have CBG rechecked one hour after he eats. MHT on hall made aware along with patient. Patient verbalizes understanding.

## 2018-04-22 NOTE — Progress Notes (Signed)
Adult Psychoeducational Group Note  Date:  04/22/2018 Time:  11:38 PM  Group Topic/Focus:  Wrap-Up Group:   The focus of this group is to help patients review their daily goal of treatment and discuss progress on daily workbooks.  Participation Level:  Active  Participation Quality:  Appropriate  Affect:  Appropriate  Cognitive:  Appropriate  Insight: Appropriate  Engagement in Group:  Engaged  Modes of Intervention:  Discussion  Additional Comments:  Patient attended group and  said that his day was a 3.  His coping skills were reading and socializing.   Eilene Voigt W Devyon Keator 04/22/2018, 11:38 PM

## 2018-04-22 NOTE — H&P (Signed)
Behavioral Health Medical Screening Exam  Micheal Lopez is an 32 y.o. male presents to [facility] as walk in with complaints of worsening depression and suicidal ideation/no specific plan; but is unable to contract for safety  Total Time spent with patient: 45 minutes  Psychiatric Specialty Exam: Physical Exam  Vitals reviewed. Constitutional: He is oriented to person, place, and time. He appears well-developed and well-nourished.  HENT:  Head: Normocephalic.  Neck: Normal range of motion.  Respiratory: Effort normal.  Musculoskeletal: Normal range of motion.  Neurological: He is alert and oriented to person, place, and time.  Skin: Skin is warm and dry.    Review of Systems  Endo/Heme/Allergies:       History of type II DM.  Wears insulin pump that has been removed.   States that he uses Fiasp and  Carbohydrate ratio 1:7 Basal rate 27.7  Psychiatric/Behavioral: Positive for depression, substance abuse and suicidal ideas. Negative for hallucinations. The patient has insomnia. The patient is not nervous/anxious.   All other systems reviewed and are negative.   Blood pressure (!) 152/89, pulse (!) 107, temperature 98.8 F (37.1 C), resp. rate 16, SpO2 100 %.There is no height or weight on file to calculate BMI.  General Appearance: Casual and Neat  Eye Contact:  Good  Speech:  Clear and Coherent and Normal Rate  Volume:  Normal  Mood:  Anxious and Depressed  Affect:  Congruent and Depressed  Thought Process:  Coherent and Goal Directed  Orientation:  Full (Time, Place, and Person)  Thought Content:  Logical  Suicidal Thoughts:  Yes.  without intent/plan  Homicidal Thoughts:  No  Memory:  Immediate;   Good Recent;   Good Remote;   Good  Judgement:  Fair  Insight:  Fair  Psychomotor Activity:  Normal  Concentration: Concentration: Good and Attention Span: Good  Recall:  Good  Fund of Knowledge:Good  Language: Good  Akathisia:  No  Handed:  Right  AIMS (if indicated):      Assets:  Communication Skills Desire for Improvement Housing Social Support  Sleep:       Musculoskeletal: Strength & Muscle Tone: within normal limits Gait & Station: normal Patient leans: N/A  Blood pressure (!) 152/89, pulse (!) 107, temperature 98.8 F (37.1 C), resp. rate 16, SpO2 100 %.  Recommendations:  Inpatient psychiatric treatment  Based on my evaluation the patient does not appear to have an emergency medical condition.  Shuvon Rankin, NP 04/22/2018, 2:11 PM
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
## 2018-04-23 LAB — COMPREHENSIVE METABOLIC PANEL
ALBUMIN: 4.6 g/dL (ref 3.5–5.0)
ALT: 18 U/L (ref 17–63)
ANION GAP: 10 (ref 5–15)
AST: 18 U/L (ref 15–41)
Alkaline Phosphatase: 72 U/L (ref 38–126)
BILIRUBIN TOTAL: 1.2 mg/dL (ref 0.3–1.2)
BUN: 16 mg/dL (ref 6–20)
CHLORIDE: 103 mmol/L (ref 101–111)
CO2: 28 mmol/L (ref 22–32)
Calcium: 9.8 mg/dL (ref 8.9–10.3)
Creatinine, Ser: 1.17 mg/dL (ref 0.61–1.24)
GFR calc Af Amer: >60 mL/min (ref >60)
GFR calc non Af Amer: >60 mL/min (ref >60)
GLUCOSE: 120 mg/dL — AB (ref 65–99)
Potassium: 4.1 mmol/L (ref 3.5–5.1)
SODIUM: 141 mmol/L (ref 135–145)
TOTAL PROTEIN: 8.1 g/dL (ref 6.5–8.1)

## 2018-04-23 LAB — TSH: TSH: 1.457 u[IU]/mL (ref 0.350–4.500)

## 2018-04-23 LAB — CBC
HEMATOCRIT: 48.6 % (ref 39.0–52.0)
Hemoglobin: 17 g/dL (ref 13.0–17.0)
MCH: 32.3 pg (ref 26.0–34.0)
MCHC: 35 g/dL (ref 30.0–36.0)
MCV: 92.4 fL (ref 78.0–100.0)
PLATELETS: 243 10*3/uL (ref 150–400)
RBC: 5.26 MIL/uL (ref 4.22–5.81)
RDW: 13.4 % (ref 11.5–15.5)
WBC: 9.5 10*3/uL (ref 4.0–10.5)

## 2018-04-23 LAB — GLUCOSE, CAPILLARY
GLUCOSE-CAPILLARY: 93 mg/dL (ref 65–99)
GLUCOSE-CAPILLARY: 278 mg/dL — AB (ref 65–99)
GLUCOSE-CAPILLARY: 317 mg/dL — AB (ref 65–99)
Glucose-Capillary: 333 mg/dL — ABNORMAL HIGH (ref 65–99)
Glucose-Capillary: 184 mg/dL — ABNORMAL HIGH (ref 65–99)

## 2018-04-23 LAB — LIPID PANEL
CHOL/HDL RATIO: 2.7 ratio
Cholesterol: 191 mg/dL (ref 0–200)
HDL: 72 mg/dL (ref >40)
LDL Cholesterol: 106 mg/dL — ABNORMAL HIGH (ref 0–99)
TRIGLYCERIDES: 63 mg/dL (ref <150)
VLDL: 13 mg/dL (ref 0–40)

## 2018-04-23 LAB — ETHANOL: Alcohol, Ethyl (B): <10 mg/dL (ref <10)

## 2018-04-23 LAB — HEMOGLOBIN A1C
Hgb A1c MFr Bld: 6.6 % — ABNORMAL HIGH (ref 4.8–5.6)
MEAN PLASMA GLUCOSE: 142.72 mg/dL

## 2018-04-23 LAB — LITHIUM LEVEL: Lithium Lvl: 0.42 mmol/L — ABNORMAL LOW (ref 0.60–1.20)

## 2018-04-23 MED ORDER — LITHIUM CARBONATE 300 MG PO CAPS
600.0000 mg | ORAL_CAPSULE | Freq: Every day | ORAL | Status: DC
  Administered 2018-04-23 – 2018-04-24 (×2): 600 mg via ORAL
  Filled 2018-04-23 (×4): qty 2

## 2018-04-23 MED ORDER — INSULIN ASPART 100 UNIT/ML ~~LOC~~ SOLN: 4.0000 [IU] | Freq: Three times a day (TID) | SUBCUTANEOUS | Status: DC

## 2018-04-23 MED ORDER — INSULIN ASPART 100 UNIT/ML ~~LOC~~ SOLN: 8.0000 [IU] | Freq: Three times a day (TID) | SUBCUTANEOUS | Status: DC

## 2018-04-23 MED ORDER — HYDROXYZINE HCL 50 MG PO TABS: 50.0000 mg | ORAL_TABLET | Freq: Three times a day (TID) | ORAL | Status: DC | PRN

## 2018-04-23 MED ORDER — LURASIDONE HCL 20 MG PO TABS
20.0000 mg | ORAL_TABLET | Freq: Every day | ORAL | Status: DC
  Filled 2018-04-23: qty 1

## 2018-04-23 MED ORDER — INSULIN ASPART 100 UNIT/ML ~~LOC~~ SOLN
8.0000 [IU] | Freq: Three times a day (TID) | SUBCUTANEOUS | Status: DC
  Administered 2018-04-23 – 2018-04-25 (×6): 8 [IU] via SUBCUTANEOUS

## 2018-04-23 MED ORDER — LITHIUM CARBONATE 300 MG PO CAPS
300.0000 mg | ORAL_CAPSULE | ORAL | Status: DC
  Administered 2018-04-24 – 2018-04-25 (×2): 300 mg via ORAL
  Filled 2018-04-23 (×5): qty 1

## 2018-04-23 MED ORDER — TRAZODONE HCL 50 MG PO TABS
25.0000 mg | ORAL_TABLET | Freq: Every evening | ORAL | Status: DC | PRN
  Administered 2018-04-23 – 2018-04-24 (×2): 25 mg via ORAL
  Filled 2018-04-23: qty 1

## 2018-04-23 NOTE — Progress Notes (Signed)
Pt presents with a flat affect and depressed mood. Pt reports ongoing depression and anxiety. Pt rates depression 5/10. Anxiety 7/10. Pt denies SI/HI today. Pt reports difficulty sleeping last night and verbalized that he's taken a low dose of Trazodone in the past to help with sleep. Writer notified MD of pt complaint. Pt denies AVH today. Pt denies any possible withdrawal symptoms from ETOH. Pt stated goal is "learning more about my diagnosis". Orders reviewed with pt. Verbal support provided. Pt attended tx team. Pt encouraged to attend groups. 15 minute checks performed for safety. Pt compliant with tx plan.
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
## 2018-04-23 NOTE — Progress Notes (Signed)
Adult Psychoeducational Group Note  Date:  04/23/2018 Time:  9:51 PM  Group Topic/Focus:  Wrap-Up Group:   The focus of this group is to help patients review their daily goal of treatment and discuss progress on daily workbooks.  Participation Level:  Active  Participation Quality:  Appropriate  Affect:  Appropriate  Cognitive:  Alert  Insight: Good  Engagement in Group:  Engaged  Modes of Intervention:  Activity  Additional Comments:  Pt rated his day a 7 med management and remain motivated.  Natasha Mead 04/23/2018, 9:51 PM

## 2018-04-23 NOTE — Progress Notes (Signed)
Recreation Therapy Notes  Date: 5.1.19 Time: 0930 Location: [address] Dayroom  Group Topic: Stress Management  Goal Area(s) Addresses:  Patient will verbalize importance of using healthy stress management.  Patient will identify positive emotions associated with healthy stress management.   Intervention: Stress Management  Activity :  Progressive Muscle Relaxation.  LRT introduced the stress management technique of PMR.  LRT led group in tensing and then relaxing each muscle.  Patients followed along to engage in activity.  Education:  Stress Management, Discharge Planning.   Education Outcome: Acknowledges edcuation/In group clarification offered/Needs additional education  Clinical Observations/Feedback: Pt did not attend group.    Caroll Rancher, LRT/CTRS         Lillia Abed, Telsa Dillavou A 04/23/2018 11:21 AM

## 2018-04-23 NOTE — Progress Notes (Signed)
Writer notified Feliz Beam NP., and provided him with a copy of the Diabetes recommendation made my the diabetes coordinator. Per Feliz Beam NP., he will consult with Dr. Jama Flavors.

## 2018-04-23 NOTE — Progress Notes (Addendum)
[facility] MD Progress Note  04/23/2018 3:59 PM Micheal Lopez  MRN:  [id number] Subjective:  Patient reports some improvement compared to how he felt prior to admission.  Thus far denies medication side effects. Denies suicidal ideations. Objective : I have discussed case with treatment team and have met with patient. 32 year old male, lives with GF, currently unemployed. Admitted due to depression, suicidal ideations. Prior history of Bipolar Disorder diagnosis and does endorse episodes of hypomania. On Li, Wellbutrin XL, Prozac prior to admission.  At this time tolerating Li and Wellbutrin XL well - we  have reviewed side effect profiles ,to include  risk of seizures on Wellbutrin management ( patient denies history of seizure disorder) ,and have reviewed symptoms of Li toxicity. Denies medication side effects at present. Today presents with improving mood, denies SI. No current hypomanic symptoms noted or reported . Labs reviewed lithium serum level 0.42, TSH 1.457, BUN16, Creat 1.17, GFR >60.  Principal Problem: Bipolar Disorder Depressed  Diagnosis:   Patient Active Problem List   Diagnosis Date Noted  . Bipolar disorder ([place]) [F31.9] 04/22/2018  . MDD (major depressive disorder), recurrent severe, without psychosis ([place]) [F33.2] 07/11/2017  . Alcohol abuse [F10.10]   . Alcohol abuse with alcohol-induced mood disorder ([place]) [F10.14] 08/23/2016  . Major depressive disorder, recurrent severe without psychotic features ([place]) [F33.2] 07/31/2016  . Alcohol intoxication ([place]) [F10.929]   . Substance induced mood disorder ([place]) [F19.94] 09/22/2015  . Alcohol use disorder, severe, dependence ([place]) [F10.20] 09/21/2015  . Suicidal ideation [R45.851]    Total Time spent with patient: 20 minutes  Past Medical History:  Past Medical History:  Diagnosis Date  . Depression    per pt.  . Diabetes mellitus without complication (HCC)    Type I   History reviewed. No pertinent surgical history. Family  History:  Family History  Problem Relation Age of Onset  . Cancer Maternal Grandmother        BREAST    Social History:  Social History   Substance and Sexual Activity  Alcohol Use Yes   Comment: binge drinking     Social History   Substance and Sexual Activity  Drug Use Yes  . Frequency: 7.0 times per week  . Types: Marijuana    Social History   Socioeconomic History  . Marital status: Legally Separated    Spouse name: Not on file  . Number of children: Not on file  . Years of education: Not on file  . Highest education level: Not on file  Occupational History  . Not on file  Social Needs  . Financial resource strain: Not on file  . Food insecurity:    Worry: Not on file    Inability: Not on file  . Transportation needs:    Medical: Not on file    Non-medical: Not on file  Tobacco Use  . Smoking status: Current Every Day Smoker    Packs/day: 1.00    Types: E-cigarettes, Cigarettes  . Smokeless tobacco: Never Used  Substance and Sexual Activity  . Alcohol use: Yes    Comment: binge drinking  . Drug use: Yes    Frequency: 7.0 times per week    Types: Marijuana  . Sexual activity: Yes  Lifestyle  . Physical activity:    Days per week: Not on file    Minutes per session: Not on file  . Stress: Not on file  Relationships  . Social connections:    Talks on phone: Not on file  Gets together: Not on file    Attends religious service: Not on file    Active member of club or organization: Not on file    Attends meetings of clubs or organizations: Not on file    Relationship status: Not on file  Other Topics Concern  . Not on file  Social History Narrative  . Not on file   Additional Social History:    Pain Medications: Please see MAR Prescriptions: Please see MAR Over the Counter: Please see MAR History of alcohol / drug use?: Yes Longest period of sobriety (when/how long): Unknown Name of Substance 1: Marijuana 1 - Age of First Use: 14 1 - Amount  (size/oz): "A couple bowls" 1 - Frequency: Daily 1 - Duration: Unknown 1 - Last Use / Amount: "3 days ago" Name of Substance 2: [place] 2 - Age of First Use: 16 2 - Amount (size/oz): "8-10 beers or a bottle of wine" 2 - Frequency: 2x/week 2 - Duration: Unknown 2 - Last Use / Amount: 04/18/18 Name of Substance 3: Cocaine 3 - Age of First Use: 29 3 - Amount (size/oz): "A couple of lines" 3 - Frequency: "About 4-5 times a year" 3 - Duration: Unknown 3 - Last Use / Amount: 04/15/18  Sleep: improving   Appetite:  improving  Current Medications: Current Facility-Administered Medications  Medication Dose Route Frequency Provider Last Rate Last Dose  . albuterol (PROVENTIL HFA;VENTOLIN HFA) 108 (90 Base) MCG/ACT inhaler 1-2 puff  1-2 puff Inhalation Q6H PRN Rankin, Shuvon B, NP      . buPROPion (WELLBUTRIN XL) 24 hr tablet 300 mg  300 mg Oral Daily Cobos, Myer Peer, MD   300 mg at 04/23/18 0801  . hydrOXYzine (ATARAX/VISTARIL) tablet 50 mg  50 mg Oral TID PRN Cobos, Myer Peer, MD      . insulin aspart (novoLOG) injection 0-5 Units  0-5 Units Subcutaneous QHS Rankin, Shuvon B, NP   4 Units at 04/22/18 2123  . insulin aspart (novoLOG) injection 0-9 Units  0-9 Units Subcutaneous TID WC Rankin, Shuvon B, NP   5 Units at 04/23/18 1214  . insulin aspart (novoLOG) injection 4 Units  4 Units Subcutaneous TID WC Rankin, Shuvon B, NP   4 Units at 04/23/18 1257  . insulin glargine (LANTUS) injection 26 Units  26 Units Subcutaneous QHS Rankin, Shuvon B, NP   26 Units at 04/22/18 2124  . [START ON 04/24/2018] lithium carbonate capsule 300 mg  300 mg Oral BH-q7a Cobos, Fernando A, MD      . lithium carbonate capsule 600 mg  600 mg Oral QHS Cobos, Myer Peer, MD      . Derrill Memo ON 04/24/2018] lurasidone (LATUDA) tablet 20 mg  20 mg Oral Q breakfast Cobos, Fernando A, MD      . nicotine (NICODERM CQ - dosed in mg/24 hours) patch 14 mg  14 mg Transdermal Daily Rankin, Shuvon B, NP   14 mg at 04/23/18 0802  .  pneumococcal 23 valent vaccine (PNU-IMMUNE) injection 0.5 mL  0.5 mL Intramuscular Tomorrow-1000 Cobos, Fernando A, MD      . traZODone (DESYREL) tablet 25 mg  25 mg Oral QHS PRN Cobos, Myer Peer, MD        Lab Results:  Results for orders placed or performed during the hospital encounter of 04/22/18 (from the past 48 hour(s))  Glucose, capillary     Status: Abnormal   Collection Time: 04/22/18  3:59 PM  Result Value Ref Range   Glucose-Capillary  164 (H) 65 - 99 mg/dL  Urinalysis, Complete w Microscopic     Status: Abnormal   Collection Time: 04/22/18  4:22 PM  Result Value Ref Range   Color, Urine YELLOW YELLOW   APPearance CLEAR CLEAR   Specific Gravity, Urine 1.008 1.005 - 1.030   pH 6.0 5.0 - 8.0   Glucose, UA >=500 (A) NEGATIVE mg/dL   Hgb urine dipstick NEGATIVE NEGATIVE   Bilirubin Urine NEGATIVE NEGATIVE   Ketones, ur 5 (A) NEGATIVE mg/dL   Protein, ur NEGATIVE NEGATIVE mg/dL   Nitrite NEGATIVE NEGATIVE   Leukocytes, UA NEGATIVE NEGATIVE   RBC / HPF 0-5 0 - 5 RBC/hpf   WBC, UA 0-5 0 - 5 WBC/hpf   Bacteria, UA NONE SEEN NONE SEEN   Mucus PRESENT     Comment: Performed at [facility], [place] [address] [place], [place] [address]  Urine rapid drug screen (hosp performed)not at [facility]     Status: Abnormal   Collection Time: 04/22/18  4:22 PM  Result Value Ref Range   Opiates NONE DETECTED NONE DETECTED   Cocaine NONE DETECTED NONE DETECTED   Benzodiazepines NONE DETECTED NONE DETECTED   Amphetamines POSITIVE (A) NONE DETECTED   Tetrahydrocannabinol POSITIVE (A) NONE DETECTED   Barbiturates NONE DETECTED NONE DETECTED    Comment: (NOTE) DRUG SCREEN FOR MEDICAL PURPOSES ONLY.  IF CONFIRMATION IS NEEDED FOR ANY PURPOSE, NOTIFY LAB WITHIN 5 DAYS. LOWEST DETECTABLE LIMITS FOR URINE DRUG SCREEN Drug Class                     Cutoff (ng/mL) Amphetamine and metabolites    1000 Barbiturate and metabolites    200 Benzodiazepine                  301 Tricyclics and metabolites     300 Opiates and metabolites        300 Cocaine and metabolites        300 THC                            50 Performed at [facility], [place] [address] [place], [place] [address]   Glucose, capillary     Status: Abnormal   Collection Time: 04/22/18  6:34 PM  Result Value Ref Range   Glucose-Capillary 337 (H) 65 - 99 mg/dL  Glucose, capillary     Status: Abnormal   Collection Time: 04/22/18  8:48 PM  Result Value Ref Range   Glucose-Capillary 329 (H) 65 - 99 mg/dL   Comment 1 Notify RN   Glucose, capillary     Status: None   Collection Time: 04/23/18  6:09 AM  Result Value Ref Range   Glucose-Capillary 93 65 - 99 mg/dL  TSH     Status: None   Collection Time: 04/23/18  7:55 AM  Result Value Ref Range   TSH 1.457 0.350 - 4.500 uIU/mL    Comment: Performed by a 3rd Generation assay with a functional sensitivity of <=0.01 uIU/mL. Performed at [facility], [place] [address], [place], [place] [address]   CBC     Status: None   Collection Time: 04/23/18  7:55 AM  Result Value Ref Range   WBC 9.5 4.0 - 10.5 K/uL   RBC 5.26 4.22 - 5.81 MIL/uL   Hemoglobin 17.0 13.0 - 17.0 g/dL   HCT 48.6 39.0 - 52.0 %   MCV 92.4  78.0 - 100.0 fL   MCH 32.3 26.0 - 34.0 pg   MCHC 35.0 30.0 - 36.0 g/dL   RDW 13.4 11.5 - 15.5 %   Platelets 243 150 - 400 K/uL    Comment: Performed at [facility], [place] [address], [place], [place] [address]  Comprehensive metabolic panel     Status: Abnormal   Collection Time: 04/23/18  7:55 AM  Result Value Ref Range   Sodium 141 135 - 145 mmol/L   Potassium 4.1 3.5 - 5.1 mmol/L   Chloride 103 101 - 111 mmol/L   CO2 28 22 - 32 mmol/L   Glucose, Bld 120 (H) 65 - 99 mg/dL   BUN 16 6 - 20 mg/dL   Creatinine, Ser 1.17 0.61 - 1.24 mg/dL   Calcium 9.8 8.9 - 10.3 mg/dL   Total Protein 8.1 6.5 - 8.1 g/dL   Albumin 4.6 3.5 - 5.0 g/dL   AST 18 15 - 41 U/L   ALT 18 17 - 63 U/L    Alkaline Phosphatase 72 38 - 126 U/L   Total Bilirubin 1.2 0.3 - 1.2 mg/dL   GFR calc non Af Amer >60 >60 mL/min   GFR calc Af Amer >60 >60 mL/min    Comment: (NOTE) The eGFR has been calculated using the CKD EPI equation. This calculation has not been validated in all clinical situations. eGFR's persistently <60 mL/min signify possible Chronic Kidney Disease.    Anion gap 10 5 - 15    Comment: Performed at [facility], [place] [address], [place], [place] [address]  Ethanol     Status: None   Collection Time: 04/23/18  7:55 AM  Result Value Ref Range   Alcohol, Ethyl (B) <10 <10 mg/dL    Comment:        LOWEST DETECTABLE LIMIT FOR SERUM ALCOHOL IS 10 mg/dL FOR MEDICAL PURPOSES ONLY Performed at [place] [address], [place], [place] [address]   Hemoglobin A1c     Status: Abnormal   Collection Time: 04/23/18  7:55 AM  Result Value Ref Range   Hgb A1c MFr Bld 6.6 (H) 4.8 - 5.6 %    Comment: (NOTE) Pre diabetes:          5.7%-6.4% Diabetes:              >6.4% Glycemic control for   <7.0% adults with diabetes    Mean Plasma Glucose 142.72 mg/dL    Comment: Performed at [place] [address] [place], [place] [address]  Lipid panel     Status: Abnormal   Collection Time: 04/23/18  7:55 AM  Result Value Ref Range   Cholesterol 191 0 - 200 mg/dL   Triglycerides 63 <150 mg/dL   HDL 72 >40 mg/dL   Total CHOL/HDL Ratio 2.7 RATIO   VLDL 13 0 - 40 mg/dL   LDL Cholesterol 106 (H) 0 - 99 mg/dL    Comment:        Total Cholesterol/HDL:CHD Risk Coronary Heart Disease Risk Table                     Men   Women  1/2 Average Risk   3.4   3.3  Average Risk       5.0   4.4  2 X Average Risk   9.6   7.1  3 X Average Risk  23.4   11.0  Use the calculated Patient Ratio above and the CHD Risk Table to determine the patient's CHD Risk.        ATP III CLASSIFICATION (LDL):  <100     mg/dL   Optimal  100-129  mg/dL   Near or  Above                    Optimal  130-159  mg/dL   Borderline  160-189  mg/dL   High  >190     mg/dL   Very High Performed at [place] [address], [place], [place] [address]   Lithium level     Status: Abnormal   Collection Time: 04/23/18  7:55 AM  Result Value Ref Range   Lithium Lvl 0.42 (L) 0.60 - 1.20 mmol/L    Comment: Performed at [facility], [place] [address] [place],  [address]  Glucose, capillary     Status: Abnormal   Collection Time: 04/23/18 12:08 PM  Result Value Ref Range   Glucose-Capillary 278 (H) 65 - 99 mg/dL  Glucose, capillary     Status: Abnormal   Collection Time: 04/23/18  2:33 PM  Result Value Ref Range   Glucose-Capillary 333 (H) 65 - 99 mg/dL    Blood Alcohol level:  Lab Results  Component Value Date   ETH <10 04/23/2018   ETH <5 02/77/4128    Metabolic Disorder Labs: Lab Results  Component Value Date   HGBA1C 6.6 (H) 04/23/2018   MPG 142.72 04/23/2018   MPG 174 07/11/2017   Lab Results  Component Value Date   PROLACTIN 19.3 (H) 07/11/2017   Lab Results  Component Value Date   CHOL 191 04/23/2018   TRIG 63 04/23/2018   HDL 72 04/23/2018   CHOLHDL 2.7 04/23/2018   VLDL 13 04/23/2018   LDLCALC 106 (H) 04/23/2018   LDLCALC 61 10/17/2015    Physical Findings: AIMS: Facial and Oral Movements Muscles of Facial Expression: None, normal Lips and Perioral Area: None, normal Jaw: None, normal Tongue: None, normal,Extremity Movements Upper (arms, wrists, hands, fingers): None, normal Lower (legs, knees, ankles, toes): None, normal, Trunk Movements Neck, shoulders, hips: None, normal, Overall Severity Severity of abnormal movements (highest score from questions above): None, normal Incapacitation due to abnormal movements: None, normal Patient's awareness of abnormal movements (rate only patient's report): No Awareness, Dental Status Current problems with teeth and/or dentures?: No Does  patient usually wear dentures?: No  CIWA:    COWS:     Musculoskeletal: Strength & Muscle Tone: within normal limits Gait & Station: normal Patient leans: N/A  Psychiatric Specialty Exam: Physical Exam  ROS no headache, no chest pain, no shortness of breath, no vomiting   Blood pressure 120/80, pulse 78, temperature 97.9 F (36.6 C), temperature source Oral, resp. rate 18, height 5' 11"  (1.803 m), weight 82.1 kg (181 lb), SpO2 100 %.Body mass index is 25.24 kg/m.  General Appearance: improving grooming   Eye Contact:  Good  Speech:  Normal Rate  Volume:  Normal  Mood:  reports partially improved mood   Affect:  more reactive   Thought Process:  Linear and Descriptions of Associations: Intact  Orientation:  Full (Time, Place, and Person)  Thought Content:  no hallucinations, no delusions, not internally preoccupied   Suicidal Thoughts:  No denies suicidal or self injurious ideations at this time and contracts for safety on unit, denies homicidal or violent ideations  Homicidal Thoughts:  No  Memory:  recent and remote grossly intact   Judgement:  Other:  improving   Insight:  improving   Psychomotor Activity:  Normal  Concentration:  Concentration: Good and Attention Span: Good  Recall:  Good  Fund of Knowledge:  Good  Language:  Good  Akathisia:  Negative  Handed:  Right  AIMS (if indicated):     Assets:  Communication Skills Desire for Improvement Resilience  ADL's:  Intact  Cognition:  WNL  Sleep:  Number of Hours: 6.25   Assessment - patient presents with partially improved and range of affect . Thus far tolerating Nicoletta Dress, Wellbutrin XL well ( had been on both these medications prior to admission). Side effects discussed .  Of note, reports he did not sleep well on Melatonin, states Trazodone has been effective in the past, even at low doses, which he prefers .  Treatment Plan Summary: Daily contact with patient to assess and evaluate symptoms and progress in treatment,  Medication management, Plan inpatient treatment  and medications as below I have discussed case with treatment team and have met with patient Encourage group and milieu participation to work on coping skills and symptom reduction Continue Wellbutrin XL 300 mgrs QDAY for depression Increase Lithium to 300 mgrs QAM and 600 mgrs QHS for mood disorder D/C Melatonin Start Trazodone 25 mgrs QHS PRN for insomnia as needed Continue Insulin ( Novolog, Lantus, Sliding Scale) for management of DM Treatment team working on disposition planning options- * I have discussed case with DM coordinator consultant - see chart- recommendation is to increase Novolog to 8 units [place] TID with meals . They will continue to monitor.   Jenne Campus, MD 04/23/2018, 3:59 PM

## 2018-04-23 NOTE — Tx Team (Signed)
Interdisciplinary Treatment and Diagnostic Plan Update  04/23/2018 Time of Session: [place] MRN: [id number]  Principal Diagnosis: <principal problem not specified>  Secondary Diagnoses: Active Problems:   Bipolar disorder ([place])   Current Medications:  Current Facility-Administered Medications  Medication Dose Route Frequency Provider Last Rate Last Dose  . albuterol (PROVENTIL HFA;VENTOLIN HFA) 108 (90 Base) MCG/ACT inhaler 1-2 puff  1-2 puff Inhalation Q6H PRN Rankin, Shuvon B, NP      . buPROPion (WELLBUTRIN XL) 24 hr tablet 300 mg  300 mg Oral Daily Cobos, Myer Peer, MD   300 mg at 04/23/18 0801  . hydrOXYzine (ATARAX/VISTARIL) tablet 50 mg  50 mg Oral TID PRN Cobos, Myer Peer, MD      . insulin aspart (novoLOG) injection 0-5 Units  0-5 Units Subcutaneous QHS Rankin, Shuvon B, NP   4 Units at 04/22/18 2123  . insulin aspart (novoLOG) injection 0-9 Units  0-9 Units Subcutaneous TID WC Rankin, Shuvon B, NP   5 Units at 04/23/18 1214  . insulin aspart (novoLOG) injection 4 Units  4 Units Subcutaneous TID WC Rankin, Shuvon B, NP   4 Units at 04/23/18 1257  . insulin glargine (LANTUS) injection 26 Units  26 Units Subcutaneous QHS Rankin, Shuvon B, NP   26 Units at 04/22/18 2124  . [START ON 04/24/2018] lithium carbonate capsule 300 mg  300 mg Oral BH-q7a Cobos, Fernando A, MD      . lithium carbonate capsule 600 mg  600 mg Oral QHS Cobos, Myer Peer, MD      . Derrill Memo ON 04/24/2018] lurasidone (LATUDA) tablet 20 mg  20 mg Oral Q breakfast Cobos, Fernando A, MD      . nicotine (NICODERM CQ - dosed in mg/24 hours) patch 14 mg  14 mg Transdermal Daily Rankin, Shuvon B, NP   14 mg at 04/23/18 0802  . pneumococcal 23 valent vaccine (PNU-IMMUNE) injection 0.5 mL  0.5 mL Intramuscular Tomorrow-1000 Cobos, Fernando A, MD      . traZODone (DESYREL) tablet 25 mg  25 mg Oral QHS PRN Cobos, Myer Peer, MD       PTA Medications: Medications Prior to Admission  Medication Sig Dispense Refill Last  Dose  . albuterol (PROVENTIL HFA;VENTOLIN HFA) 108 (90 Base) MCG/ACT inhaler Inhale 1-2 puffs into the lungs every 6 (six) hours as needed for wheezing or shortness of breath. 1 Inhaler 0 Past Month at Unknown time  . buPROPion (WELLBUTRIN XL) 300 MG 24 hr tablet Take 300 mg by mouth daily.   04/21/2018 at Unknown time  . FLUoxetine (PROZAC) 40 MG capsule Take 1 capsule (40 mg total) by mouth daily. 30 capsule 0 04/21/2018 at Unknown time  . Insulin Aspart, w/Niacinamide, (FIASP FLEXTOUCH) 100 UNIT/ML SOPN Inject into the skin. Insulin pump  27.5 basal 1:7 carb ratio   04/21/2018 at Unknown time  . lithium carbonate (LITHOBID) 300 MG CR tablet Take 900 mg by mouth at bedtime.   04/21/2018 at Unknown time  . Melatonin 3 MG TABS Take 6 mg by mouth at bedtime as needed (sleep).   3-4 days ago    Patient Stressors:    Patient Strengths:    Treatment Modalities: Medication Management, Group therapy, Case management,  1 to 1 session with clinician, Psychoeducation, Recreational therapy.   Physician Treatment Plan for Primary Diagnosis: <principal problem not specified> Long Term Goal(s): Improvement in symptoms so as ready for discharge Improvement in symptoms so as ready for discharge   Short Term Goals: Ability  to identify changes in lifestyle to reduce recurrence of condition will improve Ability to verbalize feelings will improve Ability to disclose and discuss suicidal ideas Ability to demonstrate self-control will improve Ability to identify and develop effective coping behaviors will improve Ability to demonstrate self-control will improve Ability to identify and develop effective coping behaviors will improve Ability to maintain clinical measurements within normal limits will improve Compliance with prescribed medications will improve Ability to identify triggers associated with substance abuse/mental health issues will improve  Medication Management: Evaluate patient's response, side  effects, and tolerance of medication regimen.  Therapeutic Interventions: 1 to 1 sessions, Unit Group sessions and Medication administration.  Evaluation of Outcomes: Not Met  Physician Treatment Plan for Secondary Diagnosis: Active Problems:   Bipolar disorder ([place])  Long Term Goal(s): Improvement in symptoms so as ready for discharge Improvement in symptoms so as ready for discharge   Short Term Goals: Ability to identify changes in lifestyle to reduce recurrence of condition will improve Ability to verbalize feelings will improve Ability to disclose and discuss suicidal ideas Ability to demonstrate self-control will improve Ability to identify and develop effective coping behaviors will improve Ability to demonstrate self-control will improve Ability to identify and develop effective coping behaviors will improve Ability to maintain clinical measurements within normal limits will improve Compliance with prescribed medications will improve Ability to identify triggers associated with substance abuse/mental health issues will improve     Medication Management: Evaluate patient's response, side effects, and tolerance of medication regimen.  Therapeutic Interventions: 1 to 1 sessions, Unit Group sessions and Medication administration.  Evaluation of Outcomes: Not Met   RN Treatment Plan for Primary Diagnosis: <principal problem not specified> Long Term Goal(s): Knowledge of disease and therapeutic regimen to maintain health will improve  Short Term Goals: Ability to identify and develop effective coping behaviors will improve and Compliance with prescribed medications will improve  Medication Management: RN will administer medications as ordered by provider, will assess and evaluate patient's response and provide education to patient for prescribed medication. RN will report any adverse and/or side effects to prescribing provider.  Therapeutic Interventions: 1 on 1 counseling  sessions, Psychoeducation, Medication administration, Evaluate responses to treatment, Monitor vital signs and CBGs as ordered, Perform/monitor CIWA, COWS, AIMS and Fall Risk screenings as ordered, Perform wound care treatments as ordered.  Evaluation of Outcomes: Not Met   LCSW Treatment Plan for Primary Diagnosis: <principal problem not specified> Long Term Goal(s): Safe transition to appropriate next level of care at discharge, Engage patient in therapeutic group addressing interpersonal concerns.  Short Term Goals: Engage patient in aftercare planning with referrals and resources, Increase social support and Increase skills for wellness and recovery  Therapeutic Interventions: Assess for all discharge needs, 1 to 1 time with Social worker, Explore available resources and support systems, Assess for adequacy in community support network, Educate family and significant other(s) on suicide prevention, Complete Psychosocial Assessment, Interpersonal group therapy.  Evaluation of Outcomes: Not Met   Progress in Treatment: Attending groups: Yes. Participating in groups: Yes. Taking medication as prescribed: Yes. Toleration medication: Yes. Family/Significant other contact made: No, will contact:  girlfriend Patient understands diagnosis: Yes. Discussing patient identified problems/goals with staff: Yes. Medical problems stabilized or resolved: Yes. Denies suicidal/homicidal ideation: Yes. Issues/concerns per patient self-inventory: No. Other: none  New problem(s) identified: No, Describe:  none  New Short Term/Long Term Goal(s):Pt goal: fine tune my medications, learn more about my diagnosis.  Discharge Plan or Barriers:   Reason for  Continuation of Hospitalization: Depression Medication stabilization  Estimated Length of Stay: 3-5 days.  Attendees: Patient:Micheal Lopez 04/23/2018   Physician: Dr Parke Poisson, MD 04/23/2018   Nursing: Darrol Angel, RN 04/23/2018   RN Care Manager:  04/23/2018   Social Worker: Lurline Idol, LCSW 04/23/2018   Recreational Therapist:  04/23/2018   Other:  04/23/2018   Other:  04/23/2018   Other: 04/23/2018        Scribe for Treatment Team: Joanne Chars, [place] 04/23/2018 3:58 PM

## 2018-04-23 NOTE — [facility] Group Notes (Signed)
[facility] Mental [facility] [employer] Group Therapy 04/23/2018 1:15pm  Type of Therapy: Mental Health Association Presentation  Participation Level: Active  Participation Quality: Attentive  Affect: Appropriate  Cognitive: Oriented  Insight: Developing/Improving  Engagement in Therapy: Engaged  Modes of Intervention: Discussion, Education and Socialization  Summary of Progress/Problems: Mental Health Association (MHA) Speaker came to talk about his personal journey with mental health. The pt processed ways by which to relate to the speaker. MHA speaker provided handouts and educational information pertaining to groups and services offered by the MHA. Pt was engaged in speaker's presentation and was receptive to resources provided.    Anjalee Cope Jon, LCSW 04/23/2018 4:01 PM 

## 2018-04-23 NOTE — Progress Notes (Signed)
Inpatient Diabetes Program Recommendations  AACE/ADA: New Consensus Statement on Inpatient Glycemic Control (2015)  Target Ranges:  Prepandial:   less than 140 mg/dL      Peak postprandial:   less than 180 mg/dL (1-2 hours)      Critically ill patients:  140 - 180 mg/dL   Results for AMAURY, KUZEL (MRN [id number]) as of 04/23/2018 09:28  Ref. Range 04/22/2018 15:59 04/22/2018 18:34 04/22/2018 20:48 04/23/2018 06:09  Glucose-Capillary Latest Ref Range: 65 - 99 mg/dL 782 (H) 956 (H)  11 units NOVOLOG  329 (H)  4 units NOVOLOG +  26 units LANTUS at 9:30pm  93  4 units NOVOLOG     Admit with: Bipolar Disorder/ Worsening Depression/ Suicidal Ideation  History: DM, ETOH Abuse  Home DM Meds: Insulin Pump  Current Insulin Orders: Lantus 26 units QHS      Novolog Sensitive Correction Scale/ SSI (0-9 units) TID AC + HS      Novolog 4 units TID with meals     Endocrinologist: Dr. Crista Curb [facility] in [place]).    Last saw Endocrinologist on 04/02/2018.  At that visit, patient was instructed to change his insulin to carbohydrate ratio to 1 unit for every 8 grams carbohydrates.  All other rates were to stay the same.  Called [facility] today to confirm pt's current insulin pump rates.  Spoke with representative from Dr. Lacie Draft office and pt's insulin pump rates are as follows:  Basal Rate= 1.1 unit/hr all day Total Basal Insulin per 24 hour period= 26.4 units Carbohydrate Ratio= 1 unit for every 8 grams Carbohydrates Sensitivity Factor= 1 unit for every 40 mg/dl greater than target CBG Target CBG= 100-150 mg/dl   Agree with current orders.  Note patient received 26 units Lantus last PM.  AM CBG today: 93 mg/dl.    MD- May want to change Novolog Meal Coverage to a more specific ratio to better meet pt's insulin needs with meals.  Recommend range order and have pt tell RN how many carbohydrates he ate and the RN can dose the Novolog based on the amount of carbs  eaten:  Ratio will be 1 unit for every 8 grams Carbohydrates eaten (see below)  0-8 grams Carbohydrates- 1 unit 9-16 grams Carbohydrates- 2 units 17-24 grams Carbohydrates- 3 units 25-32 grams Carbohydrates- 4 units 33-40 grams Carbohydrates- 5 units 41-48 grams Carbohydrates- 6 units 49-56 grams Carbohydrates- 7 units 57-64 grams Carbohydrates- 8 units 65-72 grams Carbohydrates- 9 units 73-80 grams Carbohydrates- 10 units 81-88 grams Carbohydrates- 11 units 89-96 grams Carbohydrates- 12 units      --Will follow patient during hospitalization--  Ambrose Finland RN, MSN, CDE Diabetes Coordinator Inpatient Glycemic Control Team Team Pager: [phone] (8a-5p)

## 2018-04-23 NOTE — Progress Notes (Signed)
Inpatient Diabetes Program Recommendations  AACE/ADA: New Consensus Statement on Inpatient Glycemic Control (2015)  Target Ranges:  Prepandial:   less than 140 mg/dL      Peak postprandial:   less than 180 mg/dL (1-2 hours)      Critically ill patients:  140 - 180 mg/dL   Lab Results  Component Value Date   GLUCAP 333 (H) 04/23/2018   HGBA1C 6.6 (H) 04/23/2018    Review of Glycemic Control  Pt needs phone app for CHO counting and electronics not allowed on adult unit at [facility]. Will need set amount of meal coverage insulin if pt eating well.  Discussed with RN.  Inpatient Diabetes Program Recommendations:     Please increase Novolog to 8 units tidwc for meal coverage insulin. Do not give if pt doesn't eat a full meal. Give meal coverage and correction insulin immediately AFTER meal.  Will follow blood sugar trends.  Thank you. Ailene Ards, RD, LDN, CDE Inpatient Diabetes Coordinator [phone]

## 2018-04-23 NOTE — Progress Notes (Signed)
Nutrition Brief Note  Patient identified on the Malnutrition Screening Tool (MST) Report  Wt Readings from Last 15 Encounters:  04/22/18 181 lb (82.1 kg)  09/18/17 168 lb (76.2 kg)  07/11/17 166 lb 12 oz (75.6 kg)  08/26/16 168 lb (76.2 kg)  08/23/16 177 lb (80.3 kg)  08/23/16 177 lb (80.3 kg)  08/01/16 174 lb (78.9 kg)  10/17/15 180 lb (81.6 kg)  09/21/15 183 lb (83 kg)  09/20/15 180 lb (81.6 kg)    Body mass index is 25.24 kg/m. Patient meets criteria for overweight based on current BMI. Skin WDL. Pt admitted for worsening depression and SI for the past few weeks. He has had increased anxiety, decreased appetite, decreased concentration, irritability, crying spells, isolation, loneliness, insomnia, and feelings of worthlessness.   Current diet order is Regular and patient is eating as desired for meals and snacks at this time. Labs and medications reviewed.   No nutrition interventions warranted at this time. If nutrition issues arise, please consult RD.     Trenton Gammon, MS, RD, LDN, [facility] Inpatient Clinical Dietitian Pager # [phone] After hours/weekend pager # [phone]

## 2018-04-24 LAB — GLUCOSE, CAPILLARY
GLUCOSE-CAPILLARY: 136 mg/dL — AB (ref 65–99)
GLUCOSE-CAPILLARY: 235 mg/dL — AB (ref 65–99)
GLUCOSE-CAPILLARY: 290 mg/dL — AB (ref 65–99)
GLUCOSE-CAPILLARY: 204 mg/dL — AB (ref 65–99)

## 2018-04-24 MED ORDER — GABAPENTIN 100 MG PO CAPS
100.0000 mg | ORAL_CAPSULE | Freq: Three times a day (TID) | ORAL | Status: DC
  Administered 2018-04-24 – 2018-04-25 (×4): 100 mg via ORAL
  Filled 2018-04-24 (×9): qty 1

## 2018-04-24 NOTE — [facility] Group Notes (Signed)
Adult Psychoeducational Group Note  Date:  04/24/2018 Time:  10:44 PM  Group Topic/Focus:  Wrap-Up Group:   The focus of this group is to help patients review their daily goal of treatment and discuss progress on daily workbooks.  Participation Level:  Active  Participation Quality:  Appropriate and Attentive  Affect:  Appropriate  Cognitive:  Alert and Appropriate  Insight: Appropriate and Good  Engagement in Group:  Engaged  Modes of Intervention:  Discussion and Education  Additional Comments:  Pt attended and participated in wrap up group this evening. Pt had a awesome day for the first time in 6 months, due to them hanging out with friends. Pt goal was to work on focusing and staying motivated by making lists for themselves.   Micheal Lopez 04/24/2018, 10:44 PM

## 2018-04-24 NOTE — Plan of Care (Signed)
  Problem: Activity: Goal: Interest or engagement in activities will improve Outcome: Progressing   Problem: Safety: Goal: Periods of time without injury will increase Outcome: Progressing   Problem: Medication: Goal: Compliance with prescribed medication regimen will improve Outcome: Progressing DAR NOTE: Patient presents with anxious affect and mood.  Pleasant on approach during assessment.  Denies suicidal thoughts, pain, auditory and visual hallucinations.  Described energy level as normal and concentration as poor.  Rates depression at 7, hopelessness at 3, and anxiety at 7.  Maintained on routine safety checks.  Medications given as prescribed.  Support and encouragement offered as needed.  Attended group and participated.  States goal for today is "focus and motivation."  Patient observed socializing with peers in the dayroom.  Remained safe on the unit. Offered no complaint.

## 2018-04-24 NOTE — [facility] Suicide Risk Assessment (Signed)
[facility] INPATIENT:  Family/Significant Other Suicide Prevention Education  Suicide Prevention Education:  Education Completed; Micheal Lopez, girlfriend, [phone], has been identified by the patient as the family member/significant other with whom the patient will be residing, and identified as the person(s) who will aid the patient in the event of a mental health crisis (suicidal ideations/suicide attempt).  With written consent from the patient, the family member/significant other has been provided the following suicide prevention education, prior to the and/or following the discharge of the patient.  The suicide prevention education provided includes the following:  Suicide risk factors  Suicide prevention and interventions  National Suicide Hotline telephone number  [facility] assessment telephone number  [facility] Emergency Assistance 911  [facility] and/or Residential Mobile Crisis Unit telephone number  Request made of family/significant other to:  Remove weapons (e.g., guns, rifles, knives), all items previously/currently identified as safety concern.  No guns in home, per [place].  Remove drugs/medications (over-the-counter, prescriptions, illicit drugs), all items previously/currently identified as a safety concern.  The family member/significant other verbalizes understanding of the suicide prevention education information provided.  The family member/significant other agrees to remove the items of safety concern listed above.  Micheal Lopez reports she has a diagnosis of bipolar disorder herself and has been unhappy for a few months.  She shared this with pt and "he felt rejected" and it "really got to him".  This occurred prior to his coming to the hospital.  He is able to return to her home after discharge.  Lorri Frederick, LCSW 04/24/2018, 11:23 AM

## 2018-04-24 NOTE — Progress Notes (Signed)
Nursing Progress Note: 7p-7a D: Pt currently presents with a pleasant/anxious affect and behavior.  Interacting appropriately with the milieu. Pt reports poor sleep during the previous night with current medication regimen. Pt did attend wrap-up group.  A: Pt provided with medications per providers orders. Pt's labs and vitals were monitored throughout the night. Pt supported emotionally and encouraged to express concerns and questions. Pt educated on medications.  R: Pt's safety ensured with 15 minute and environmental checks. Pt currently denies SI, HI, and AVH. Pt verbally contracts to seek staff if SI,HI, or AVH occurs and to consult with staff before acting on any harmful thoughts. Will continue to monitor.

## 2018-04-24 NOTE — Progress Notes (Addendum)
[facility] MD Progress Note  04/24/2018 8:38 AM Micheal Lopez  MRN:  [id number] Subjective: reports gradually/partially improving mood . Reports persistent anxiety, even as mood is tending to improve..  Denies suicidal ideations.  Denies medication side effects at this time.  Objective : I have discussed case with treatment team and have met with patient. 32 year old male, lives with GF, currently unemployed. Admitted due to depression, suicidal ideations. Prior history of Bipolar Disorder diagnosis and does endorse episodes of hypomania. On Li, Wellbutrin XL, Prozac prior to admission.  Reports improving mood and denies suicidal ideations. Reports anxiety, tendency to ruminate about stressors. Visible in day room, interactive with peers, going to groups, pleasant on approach. Currently tolerating medications well, denies side effects. Nicoletta Dress, Wellbutrin XL) . As noted, reports these medications have been well tolerated and effective .     Principal Problem: Bipolar Disorder Depressed  Diagnosis:   Patient Active Problem List   Diagnosis Date Noted  . Bipolar disorder ([place]) [F31.9] 04/22/2018  . MDD (major depressive disorder), recurrent severe, without psychosis ([place]) [F33.2] 07/11/2017  . Alcohol abuse [F10.10]   . Alcohol abuse with alcohol-induced mood disorder ([place]) [F10.14] 08/23/2016  . Major depressive disorder, recurrent severe without psychotic features ([place]) [F33.2] 07/31/2016  . Alcohol intoxication ([place]) [F10.929]   . Substance induced mood disorder ([place]) [F19.94] 09/22/2015  . Alcohol use disorder, severe, dependence ([place]) [F10.20] 09/21/2015  . Suicidal ideation [R45.851]    Total Time spent with patient: 20 minutes  Past Medical History:  Past Medical History:  Diagnosis Date  . Depression    per pt.  . Diabetes mellitus without complication (HCC)    Type I   History reviewed. No pertinent surgical history. Family History:  Family History  Problem Relation Age of Onset   . Cancer Maternal Grandmother        BREAST    Social History:  Social History   Substance and Sexual Activity  Alcohol Use Yes   Comment: binge drinking     Social History   Substance and Sexual Activity  Drug Use Yes  . Frequency: 7.0 times per week  . Types: Marijuana    Social History   Socioeconomic History  . Marital status: Legally Separated    Spouse name: Not on file  . Number of children: Not on file  . Years of education: Not on file  . Highest education level: Not on file  Occupational History  . Not on file  Social Needs  . Financial resource strain: Not on file  . Food insecurity:    Worry: Not on file    Inability: Not on file  . Transportation needs:    Medical: Not on file    Non-medical: Not on file  Tobacco Use  . Smoking status: Current Every Day Smoker    Packs/day: 1.00    Types: E-cigarettes, Cigarettes  . Smokeless tobacco: Never Used  Substance and Sexual Activity  . Alcohol use: Yes    Comment: binge drinking  . Drug use: Yes    Frequency: 7.0 times per week    Types: Marijuana  . Sexual activity: Yes  Lifestyle  . Physical activity:    Days per week: Not on file    Minutes per session: Not on file  . Stress: Not on file  Relationships  . Social connections:    Talks on phone: Not on file    Gets together: Not on file    Attends religious service: Not  on file    Active member of club or organization: Not on file    Attends meetings of clubs or organizations: Not on file    Relationship status: Not on file  Other Topics Concern  . Not on file  Social History Narrative  . Not on file   Additional Social History:    Pain Medications: Please see MAR Prescriptions: Please see MAR Over the Counter: Please see MAR History of alcohol / drug use?: Yes Longest period of sobriety (when/how long): Unknown Name of Substance 1: Marijuana 1 - Age of First Use: 14 1 - Amount (size/oz): "A couple bowls" 1 - Frequency: Daily 1 -  Duration: Unknown 1 - Last Use / Amount: "3 days ago" Name of Substance 2: [place] 2 - Age of First Use: 16 2 - Amount (size/oz): "8-10 beers or a bottle of wine" 2 - Frequency: 2x/week 2 - Duration: Unknown 2 - Last Use / Amount: 04/18/18 Name of Substance 3: Cocaine 3 - Age of First Use: 29 3 - Amount (size/oz): "A couple of lines" 3 - Frequency: "About 4-5 times a year" 3 - Duration: Unknown 3 - Last Use / Amount: 04/15/18  Sleep: improving   Appetite:  improving  Current Medications: Current Facility-Administered Medications  Medication Dose Route Frequency Provider Last Rate Last Dose  . albuterol (PROVENTIL HFA;VENTOLIN HFA) 108 (90 Base) MCG/ACT inhaler 1-2 puff  1-2 puff Inhalation Q6H PRN Rankin, Shuvon B, NP      . buPROPion (WELLBUTRIN XL) 24 hr tablet 300 mg  300 mg Oral Daily Cobos, Fernando A, MD   300 mg at 04/24/18 0800  . hydrOXYzine (ATARAX/VISTARIL) tablet 50 mg  50 mg Oral TID PRN Cobos, Myer Peer, MD      . insulin aspart (novoLOG) injection 0-5 Units  0-5 Units Subcutaneous QHS Rankin, Shuvon B, NP   4 Units at 04/22/18 2123  . insulin aspart (novoLOG) injection 0-9 Units  0-9 Units Subcutaneous TID WC Rankin, Shuvon B, NP   1 Units at 04/24/18 0634  . insulin aspart (novoLOG) injection 8 Units  8 Units Subcutaneous TID WC Cobos, Myer Peer, MD   8 Units at 04/24/18 [phone]  . insulin glargine (LANTUS) injection 26 Units  26 Units Subcutaneous QHS Rankin, Shuvon B, NP   26 Units at 04/23/18 2240  . lithium carbonate capsule 300 mg  300 mg Oral BH-q7a Cobos, Myer Peer, MD   300 mg at 04/24/18 0631  . lithium carbonate capsule 600 mg  600 mg Oral QHS Cobos, Fernando A, MD   600 mg at 04/23/18 2236  . nicotine (NICODERM CQ - dosed in mg/24 hours) patch 14 mg  14 mg Transdermal Daily Rankin, Shuvon B, NP   14 mg at 04/24/18 0803  . pneumococcal 23 valent vaccine (PNU-IMMUNE) injection 0.5 mL  0.5 mL Intramuscular Tomorrow-1000 Cobos, Fernando A, MD      . traZODone  (DESYREL) tablet 25 mg  25 mg Oral QHS PRN Cobos, Myer Peer, MD   25 mg at 04/23/18 2236    Lab Results:  Results for orders placed or performed during the hospital encounter of 04/22/18 (from the past 48 hour(s))  Glucose, capillary     Status: Abnormal   Collection Time: 04/22/18  3:59 PM  Result Value Ref Range   Glucose-Capillary 164 (H) 65 - 99 mg/dL  Urinalysis, Complete w Microscopic     Status: Abnormal   Collection Time: 04/22/18  4:22 PM  Result Value Ref Range  Color, Urine YELLOW YELLOW   APPearance CLEAR CLEAR   Specific Gravity, Urine 1.008 1.005 - 1.030   pH 6.0 5.0 - 8.0   Glucose, UA >=500 (A) NEGATIVE mg/dL   Hgb urine dipstick NEGATIVE NEGATIVE   Bilirubin Urine NEGATIVE NEGATIVE   Ketones, ur 5 (A) NEGATIVE mg/dL   Protein, ur NEGATIVE NEGATIVE mg/dL   Nitrite NEGATIVE NEGATIVE   Leukocytes, UA NEGATIVE NEGATIVE   RBC / HPF 0-5 0 - 5 RBC/hpf   WBC, UA 0-5 0 - 5 WBC/hpf   Bacteria, UA NONE SEEN NONE SEEN   Mucus PRESENT     Comment: Performed at [facility], [place] [address] [place], [place] [address]  Urine rapid drug screen (hosp performed)not at [facility]     Status: Abnormal   Collection Time: 04/22/18  4:22 PM  Result Value Ref Range   Opiates NONE DETECTED NONE DETECTED   Cocaine NONE DETECTED NONE DETECTED   Benzodiazepines NONE DETECTED NONE DETECTED   Amphetamines POSITIVE (A) NONE DETECTED   Tetrahydrocannabinol POSITIVE (A) NONE DETECTED   Barbiturates NONE DETECTED NONE DETECTED    Comment: (NOTE) DRUG SCREEN FOR MEDICAL PURPOSES ONLY.  IF CONFIRMATION IS NEEDED FOR ANY PURPOSE, NOTIFY LAB WITHIN 5 DAYS. LOWEST DETECTABLE LIMITS FOR URINE DRUG SCREEN Drug Class                     Cutoff (ng/mL) Amphetamine and metabolites    1000 Barbiturate and metabolites    200 Benzodiazepine                 660 Tricyclics and metabolites     300 Opiates and metabolites        300 Cocaine and metabolites        300 THC                             50 Performed at [facility], [place] [address], [place], [place] [address]   Glucose, capillary     Status: Abnormal   Collection Time: 04/22/18  6:34 PM  Result Value Ref Range   Glucose-Capillary 337 (H) 65 - 99 mg/dL  Glucose, capillary     Status: Abnormal   Collection Time: 04/22/18  8:48 PM  Result Value Ref Range   Glucose-Capillary 329 (H) 65 - 99 mg/dL   Comment 1 Notify RN   Glucose, capillary     Status: None   Collection Time: 04/23/18  6:09 AM  Result Value Ref Range   Glucose-Capillary 93 65 - 99 mg/dL  TSH     Status: None   Collection Time: 04/23/18  7:55 AM  Result Value Ref Range   TSH 1.457 0.350 - 4.500 uIU/mL    Comment: Performed by a 3rd Generation assay with a functional sensitivity of <=0.01 uIU/mL. Performed at [facility], [place] [address] [place],  [address]   CBC     Status: None   Collection Time: 04/23/18  7:55 AM  Result Value Ref Range   WBC 9.5 4.0 - 10.5 K/uL   RBC 5.26 4.22 - 5.81 MIL/uL   Hemoglobin 17.0 13.0 - 17.0 g/dL   HCT 48.6 39.0 - 52.0 %   MCV 92.4 78.0 - 100.0 fL   MCH 32.3 26.0 - 34.0 pg   MCHC 35.0 30.0 - 36.0 g/dL   RDW 13.4 11.5 - 15.5 %   Platelets 243  150 - 400 K/uL    Comment: Performed at [facility], [place] [address] [place], [place] [address]  Comprehensive metabolic panel     Status: Abnormal   Collection Time: 04/23/18  7:55 AM  Result Value Ref Range   Sodium 141 135 - 145 mmol/L   Potassium 4.1 3.5 - 5.1 mmol/L   Chloride 103 101 - 111 mmol/L   CO2 28 22 - 32 mmol/L   Glucose, Bld 120 (H) 65 - 99 mg/dL   BUN 16 6 - 20 mg/dL   Creatinine, Ser 1.17 0.61 - 1.24 mg/dL   Calcium 9.8 8.9 - 10.3 mg/dL   Total Protein 8.1 6.5 - 8.1 g/dL   Albumin 4.6 3.5 - 5.0 g/dL   AST 18 15 - 41 U/L   ALT 18 17 - 63 U/L   Alkaline Phosphatase 72 38 - 126 U/L   Total Bilirubin 1.2 0.3 - 1.2 mg/dL   GFR calc non Af Amer >60 >60 mL/min   GFR calc Af Amer  >60 >60 mL/min    Comment: (NOTE) The eGFR has been calculated using the CKD EPI equation. This calculation has not been validated in all clinical situations. eGFR's persistently <60 mL/min signify possible Chronic Kidney Disease.    Anion gap 10 5 - 15    Comment: Performed at [facility], [place] [address], [place], [place] [address]  Ethanol     Status: None   Collection Time: 04/23/18  7:55 AM  Result Value Ref Range   Alcohol, Ethyl (B) <10 <10 mg/dL    Comment:        LOWEST DETECTABLE LIMIT FOR SERUM ALCOHOL IS 10 mg/dL FOR MEDICAL PURPOSES ONLY Performed at [place] [address], [place], [place] [address]   Hemoglobin A1c     Status: Abnormal   Collection Time: 04/23/18  7:55 AM  Result Value Ref Range   Hgb A1c MFr Bld 6.6 (H) 4.8 - 5.6 %    Comment: (NOTE) Pre diabetes:          5.7%-6.4% Diabetes:              >6.4% Glycemic control for   <7.0% adults with diabetes    Mean Plasma Glucose 142.72 mg/dL    Comment: Performed at [place] [address] [place],  [address]  Lipid panel     Status: Abnormal   Collection Time: 04/23/18  7:55 AM  Result Value Ref Range   Cholesterol 191 0 - 200 mg/dL   Triglycerides 63 <150 mg/dL   HDL 72 >40 mg/dL   Total CHOL/HDL Ratio 2.7 RATIO   VLDL 13 0 - 40 mg/dL   LDL Cholesterol 106 (H) 0 - 99 mg/dL    Comment:        Total Cholesterol/HDL:CHD Risk Coronary Heart Disease Risk Table                     Men   Women  1/2 Average Risk   3.4   3.3  Average Risk       5.0   4.4  2 X Average Risk   9.6   7.1  3 X Average Risk  23.4   11.0        Use the calculated Patient Ratio above and the CHD Risk Table to determine the patient's CHD Risk.        ATP III CLASSIFICATION (LDL):  <100  mg/dL   Optimal  100-129  mg/dL   Near or Above                    Optimal  130-159  mg/dL   Borderline  160-189  mg/dL   High  >190     mg/dL   Very High Performed at  [place] [address] [place], [place] [address]   Lithium level     Status: Abnormal   Collection Time: 04/23/18  7:55 AM  Result Value Ref Range   Lithium Lvl 0.42 (L) 0.60 - 1.20 mmol/L    Comment: Performed at [facility], [place] [address], [place], [place] [address]  Glucose, capillary     Status: Abnormal   Collection Time: 04/23/18 12:08 PM  Result Value Ref Range   Glucose-Capillary 278 (H) 65 - 99 mg/dL  Glucose, capillary     Status: Abnormal   Collection Time: 04/23/18  2:33 PM  Result Value Ref Range   Glucose-Capillary 333 (H) 65 - 99 mg/dL  Glucose, capillary     Status: Abnormal   Collection Time: 04/23/18  4:58 PM  Result Value Ref Range   Glucose-Capillary 317 (H) 65 - 99 mg/dL  Glucose, capillary     Status: Abnormal   Collection Time: 04/23/18  9:23 PM  Result Value Ref Range   Glucose-Capillary 184 (H) 65 - 99 mg/dL  Glucose, capillary     Status: Abnormal   Collection Time: 04/24/18  5:45 AM  Result Value Ref Range   Glucose-Capillary 136 (H) 65 - 99 mg/dL    Blood Alcohol level:  Lab Results  Component Value Date   ETH <10 04/23/2018   ETH <5 42/68/3419    Metabolic Disorder Labs: Lab Results  Component Value Date   HGBA1C 6.6 (H) 04/23/2018   MPG 142.72 04/23/2018   MPG 174 07/11/2017   Lab Results  Component Value Date   PROLACTIN 19.3 (H) 07/11/2017   Lab Results  Component Value Date   CHOL 191 04/23/2018   TRIG 63 04/23/2018   HDL 72 04/23/2018   CHOLHDL 2.7 04/23/2018   VLDL 13 04/23/2018   LDLCALC 106 (H) 04/23/2018   LDLCALC 61 10/17/2015    Physical Findings: AIMS: Facial and Oral Movements Muscles of Facial Expression: None, normal Lips and Perioral Area: None, normal Jaw: None, normal Tongue: None, normal,Extremity Movements Upper (arms, wrists, hands, fingers): None, normal Lower (legs, knees, ankles, toes): None, normal, Trunk Movements Neck, shoulders, hips: None, normal,  Overall Severity Severity of abnormal movements (highest score from questions above): None, normal Incapacitation due to abnormal movements: None, normal Patient's awareness of abnormal movements (rate only patient's report): No Awareness, Dental Status Current problems with teeth and/or dentures?: No Does patient usually wear dentures?: No  CIWA:    COWS:     Musculoskeletal: Strength & Muscle Tone: within normal limits Gait & Station: normal Patient leans: N/A  Psychiatric Specialty Exam: Physical Exam  ROS no headache, no chest pain, no shortness of breath, no vomiting   Blood pressure (!) 133/94, pulse 88, temperature 98.2 F (36.8 C), temperature source Oral, resp. rate (!) 30, height 5' 11"  (1.803 m), weight 82.1 kg (181 lb), SpO2 100 %.Body mass index is 25.24 kg/m.  General Appearance: improved grooming   Eye Contact:  Good  Speech:  Normal Rate  Volume:  Normal  Mood:  improving mood, states he feels better, reports ongoing anxiety  Affect:  reactive,  smiles at times appropriately, presents anxious  Thought Process:  Linear and Descriptions of Associations: Intact  Orientation:  Full (Time, Place, and Person)  Thought Content:  no hallucinations, no delusions, not internally preoccupied   Suicidal Thoughts:  No denies suicidal or self injurious ideations at this time and contracts for safety on unit, denies homicidal or violent ideations  Homicidal Thoughts:  No  Memory:  recent and remote grossly intact   Judgement:  Other:  improving   Insight:  improving   Psychomotor Activity:  Normal- no restlessness or agitation, no distal tremors noted   Concentration:  Concentration: Good and Attention Span: Good  Recall:  Good  Fund of Knowledge:  Good  Language:  Good  Akathisia:  Negative  Handed:  Right  AIMS (if indicated):     Assets:  Communication Skills Desire for Improvement Resilience  ADL's:  Intact  Cognition:  WNL  Sleep:  Number of Hours: 5.75    Assessment - patient reports significant improvement compared to how he felt prior to admission, and currently presents with improving mood and range of affect . Denies suicidal ideations. Today emphasizes residual/ongoing anxiety as main symptom. We have discussed options - states Vistaril does not help much and causes him to feel too sedated . We agreed to avoid BZDs , due to addictive/abuse potential. He states he has been on Neurontin before and remembers it may have helped, does not remember having had side effects.  Agrees to Neurontin trial, side effect and off label use for anxiety reviewed.   Treatment Plan Summary: Daily contact with patient to assess and evaluate symptoms and progress in treatment, Medication management, Plan inpatient treatment  and medications as below I have discussed case with treatment team and have met with patient Encourage group and milieu participation to work on coping skills and symptom reduction Continue Wellbutrin XL 300 mgrs QDAY for depression Continue Lithium to 300 mgrs QAM and 600 mgrs QHS for mood disorder Start Neurontin 100 mgrs TID initially for anxiety.  Continue Trazodone 25 mgrs QHS PRN for insomnia as needed Continue Insulin ( Novolog, Lantus, Sliding Scale) for management of DM  Treatment team working on disposition planning options   Jenne Campus, MD 04/24/2018, 8:38 AM   Patient ID: Micheal Lopez, male   DOB: 1986-06-26, 32 y.o.   MRN: [id number]

## 2018-04-24 NOTE — Progress Notes (Addendum)
Inpatient Diabetes Program Recommendations  AACE/ADA: New Consensus Statement on Inpatient Glycemic Control (2015)  Target Ranges:  Prepandial:   less than 140 mg/dL      Peak postprandial:   less than 180 mg/dL (1-2 hours)      Critically ill patients:  140 - 180 mg/dL   Results for SALAR, MOLDEN (MRN [id number]) as of 04/24/2018 13:34  Ref. Range 04/23/2018 06:09 04/23/2018 12:08 04/23/2018 14:33 04/23/2018 16:58 04/23/2018 21:23  Glucose-Capillary Latest Ref Range: 65 - 99 mg/dL 93  4 units NOVOLOG  027 (H)  9 units NOVOLOG  333 (H) 317 (H)  15 units NOVOLOG  184 (H)    26 units LANTUS   Results for EDSON, DERIDDER (MRN [id number]) as of 04/24/2018 13:34  Ref. Range 04/24/2018 05:45 04/24/2018 12:20  Glucose-Capillary Latest Ref Range: 65 - 99 mg/dL 474 (H)  9 units NOVOLOG  235 (H)  11 units NOVOLOG      Home DM Meds: Insulin Pump  Current Insulin Orders: Lantus 26 units QHS                                       Novolog Sensitive Correction Scale/ SSI (0-9 units) TID AC + HS                                       Novolog 8 units TID with meals    Pt needs phone app for CHO counting and electronics not allowed on adult unit at [facility].  Will need set amount of meal coverage insulin if pt eating well.     Please increase Novolog Meal Coverage to 10 units TID with meals.  Do not give if pt doesn't eat a full meal.   Give meal coverage and correction insulin immediately AFTER meal.     --Will follow patient during hospitalization--  Ambrose Finland RN, MSN, CDE Diabetes Coordinator Inpatient Glycemic Control Team Team Pager: [phone] (8a-5p)

## 2018-04-24 NOTE — [facility] Group Notes (Signed)
[facility] LCSW Group Therapy Note  Date/Time: 04/24/18, 1315  Type of Therapy/Topic:  Group Therapy:  Balance in Life  Participation Level:  moderate  Description of Group:    This group will address the concept of balance and how it feels and looks when one is unbalanced. Patients will be encouraged to process areas in their lives that are out of balance, and identify reasons for remaining unbalanced. Facilitators will guide patients utilizing problem- solving interventions to address and correct the stressor making their life unbalanced. Understanding and applying boundaries will be explored and addressed for obtaining  and maintaining a balanced life. Patients will be encouraged to explore ways to assertively make their unbalanced needs known to significant others in their lives, using other group members and facilitator for support and feedback.  Therapeutic Goals: 1. Patient will identify two or more emotions or situations they have that consume much of in their lives. 2. Patient will identify signs/triggers that life has become out of balance:  3. Patient will identify two ways to set boundaries in order to achieve balance in their lives:  4. Patient will demonstrate ability to communicate their needs through discussion and/or role plays  Summary of Patient Progress: Pt shared that spiritual, work and financial are areas that are out of balance in his life currently.  Pt was attentive and made several comments during group discussion.  Good participation.            Therapeutic Modalities:   Cognitive Behavioral Therapy Solution-Focused Therapy Assertiveness Training  Daleen Squ[place]b, [place]

## 2018-04-25 LAB — GLUCOSE, CAPILLARY
Glucose-Capillary: 270 mg/dL — ABNORMAL HIGH (ref 65–99)
Glucose-Capillary: 194 mg/dL — ABNORMAL HIGH (ref 65–99)

## 2018-04-25 MED ORDER — LITHIUM CARBONATE 600 MG PO CAPS: 600.0000 mg | ORAL_CAPSULE | Freq: Every day | ORAL | 0 refills | Status: AC

## 2018-04-25 MED ORDER — HYDROXYZINE HCL 50 MG PO TABS: 50.0000 mg | ORAL_TABLET | Freq: Three times a day (TID) | ORAL | 0 refills | Status: AC | PRN

## 2018-04-25 MED ORDER — GABAPENTIN 100 MG PO CAPS: 100.0000 mg | ORAL_CAPSULE | Freq: Three times a day (TID) | ORAL | 0 refills | Status: AC

## 2018-04-25 MED ORDER — TRAZODONE HCL 50 MG PO TABS: 25.0000 mg | ORAL_TABLET | Freq: Every evening | ORAL | 0 refills | Status: AC | PRN

## 2018-04-25 MED ORDER — ALBUTEROL SULFATE HFA 108 (90 BASE) MCG/ACT IN AERS: 1.0000 | INHALATION_SPRAY | Freq: Four times a day (QID) | RESPIRATORY_TRACT | 0 refills | Status: AC | PRN

## 2018-04-25 MED ORDER — LITHIUM CARBONATE 300 MG PO CAPS: 300.0000 mg | ORAL_CAPSULE | ORAL | 0 refills | Status: AC

## 2018-04-25 MED ORDER — BUPROPION HCL ER (XL) 300 MG PO TB24: 300.0000 mg | ORAL_TABLET | Freq: Every day | ORAL | 0 refills | Status: AC

## 2018-04-25 NOTE — BHH Suicide Risk Assessment (Signed)
[facility] Discharge Suicide Risk Assessment   Principal Problem: Bipolar disorder [facility]) Discharge Diagnoses:  Patient Active Problem List   Diagnosis Date Noted  . Bipolar disorder (HCC) [F31.9] 04/22/2018  . MDD (major depressive disorder), recurrent severe, without psychosis (HCC) [F33.2] 07/11/2017  . Alcohol abuse [F10.10]   . Alcohol abuse with alcohol-induced mood disorder (HCC) [F10.14] 08/23/2016  . Major depressive disorder, recurrent severe without psychotic features (HCC) [F33.2] 07/31/2016  . Alcohol intoxication (HCC) [F10.929]   . Substance induced mood disorder (HCC) [F19.94] 09/22/2015  . Alcohol use disorder, severe, dependence (HCC) [F10.20] 09/21/2015  . Suicidal ideation [R45.851]     Total Time spent with patient: 30 minutes  Musculoskeletal: Strength & Muscle Tone: within normal limits Gait & Station: normal Patient leans: N/A  Psychiatric Specialty Exam: ROS denies headache, no chest pain, no shortness of breath, no vomiting, no rash, no fever   Blood pressure 128/85, pulse 81, temperature 98.2 F (36.8 C), temperature source Oral, resp. rate (!) 30, height  (1.803 m), weight 82.1 kg (181 lb), SpO2 100 %.Body mass index is 25.24 kg/m.  General Appearance: improved grooming   Eye Contact::  Good  Speech:  Normal Rate409  Volume:  Normal  Mood:  improved, states mood is " pretty good " today  Affect:  appropriate, reactive, brighter   Thought Process:  Linear and Descriptions of Associations: Intact  Orientation:  Full (Time, Place, and Person)  Thought Content:  no hallucinations, no delusions, not internally preoccupied  Suicidal Thoughts:  No denies suicidal or self injurious ideations, denies any homicidal or violent ideations, future oriented  Homicidal Thoughts:  No  Memory:  recent and remote grossly intact   Judgement:  Other:  improving  Insight:  improving   Psychomotor Activity:  Normal  Concentration:  Good  Recall:  Good  Fund of  Knowledge:Good  Language: Good  Akathisia:  Negative  Handed:  Right  AIMS (if indicated):     Assets:  Communication Skills Desire for Improvement Resilience  Sleep:  Number of Hours: 6  Cognition: WNL  ADL's:  Intact   Mental Status Per Nursing Assessment::   On Admission:     Demographic Factors:  32 year old male, has a 31 year old who lives with the mother, lives with GF, currently unemployed   Loss Factors: Unemployment, difficulty maintaining employment   Historical Factors: History of depression, has been diagnosed with Bipolar Disorder in the past, prior psychiatric admissions  History of DM type I   Risk Reduction Factors:   Responsible for children under 57 years of age, Sense of responsibility to family, Living with another person, especially a relative and Positive coping skills or problem solving skills  Continued Clinical Symptoms:  At this time patient is alert, attentive, well related, calm, mood improved compared to admission and reports feeling significantly better than he did , affect reactive, appropriate, full in range, no thought disorder, no suicidal or self injurious ideations, no homicidal or violent ideations, no hallucinations, no delusions, not internally preoccupied, future oriented, reports he plans to look for a job. Denies medication side effects, feels medications have been helpful and well tolerated- we reviewed medication side effects, focusing on Li side effects, potential symptoms associated with toxicity , and importance of maintaining adequate hydration. We also reviewed drug drug interactions such as between Li and NSAID. Currently tolerating medications well . Behavior on unit in good control, pleasant on approach.  Cognitive Features That Contribute To Risk:  No  gross cognitive deficits noted upon discharge. Is alert , attentive, and oriented x 3   Suicide Risk:  Mild:  Suicidal ideation of limited frequency, intensity, duration, and  specificity.  There are no identifiable plans, no associated intent, mild dysphoria and related symptoms, good self-control (both objective and subjective assessment), few other risk factors, and identifiable protective factors, including available and accessible social support.  Follow-up Information    [facility] Go on 04/28/2018.   Why:  Please attend your follow up appt with Eilene Ghazi on Monday, 04/28/18, at 5:15pm. Contact information: [address] [place] [address] [phone]        [facility]. Go on 05/02/2018.   Why:  Please attend your therapy appt with Lubertha South on Friday, 05/02/18, 11:00. Contact information: [address],  [place], [place] [address] P: [phone] F: [phone]          Plan Of Care/Follow-up recommendations:  Activity:  as tolerated  Diet:  Diabetic Diet  Tests:  NA Other:  See below  Patient expresses readiness for discharge , leaving unit in good spirits . Plans to return home. Follow up as above. He also has an Midwife for management of DM ( Dr. Shawnee Knapp)   Craige Cotta, MD 04/25/2018, 11:59 AM

## 2018-04-25 NOTE — Discharge Summary (Addendum)
Physician Discharge Summary Note  Patient:  Micheal Lopez is an 32 y.o., male MRN:  [id number] DOB:  12-31-1985 Patient phone:  [phone] (home)  Patient address:   [address] [place] [address],  Total Time spent with patient: 20 minutes  Date of Admission:  04/22/2018 Date of Discharge: 04/25/18  Reason for Admission:  Worsening depression with SI  Principal Problem: Bipolar disorder [facility]) Discharge Diagnoses: Patient Active Problem List   Diagnosis Date Noted  . Bipolar disorder (HCC) [F31.9] 04/22/2018  . MDD (major depressive disorder), recurrent severe, without psychosis (HCC) [F33.2] 07/11/2017  . Alcohol abuse [F10.10]   . Alcohol abuse with alcohol-induced mood disorder (HCC) [F10.14] 08/23/2016  . Major depressive disorder, recurrent severe without psychotic features (HCC) [F33.2] 07/31/2016  . Alcohol intoxication (HCC) [F10.929]   . Substance induced mood disorder (HCC) [F19.94] 09/22/2015  . Alcohol use disorder, severe, dependence (HCC) [F10.20] 09/21/2015  . Suicidal ideation [R45.851]     Past Psychiatric History: History of Bipolar disorder.  Multiple suicide attempts in the past, Polysubstance use disorder; Patient has outpatient services [facility] (psychiatrist Dr. Eilene Ghazi and therapist Garlan Fillers);  Psychotropic medications Wellbutrin, Lithium, and Prozac that he states he is compliant with  Past Medical History:  Past Medical History:  Diagnosis Date  . Depression    per pt.  . Diabetes mellitus without complication (HCC)    Type I   History reviewed. No pertinent surgical history. Family History:  Family History  Problem Relation Age of Onset  . Cancer Maternal Grandmother        BREAST    Family Psychiatric  History: Unknown Social History:  Social History   Substance and Sexual Activity  Alcohol Use Yes   Comment: binge drinking     Social History   Substance and Sexual Activity  Drug Use Yes  .  Frequency: 7.0 times per week  . Types: Marijuana    Social History   Socioeconomic History  . Marital status: Legally Separated    Spouse name: Not on file  . Number of children: Not on file  . Years of education: Not on file  . Highest education level: Not on file  Occupational History  . Not on file  Social Needs  . Financial resource strain: Not on file  . Food insecurity:    Worry: Not on file    Inability: Not on file  . Transportation needs:    Medical: Not on file    Non-medical: Not on file  Tobacco Use  . Smoking status: Current Every Day Smoker    Packs/day: 1.00    Types: E-cigarettes, Cigarettes  . Smokeless tobacco: Never Used  Substance and Sexual Activity  . Alcohol use: Yes    Comment: binge drinking  . Drug use: Yes    Frequency: 7.0 times per week    Types: Marijuana  . Sexual activity: Yes  Lifestyle  . Physical activity:    Days per week: Not on file    Minutes per session: Not on file  . Stress: Not on file  Relationships  . Social connections:    Talks on phone: Not on file    Gets together: Not on file    Attends religious service: Not on file    Active member of club or organization: Not on file    Attends meetings of clubs or organizations: Not on file    Relationship status: Not on file  Other Topics Concern  .  Not on file  Social History Narrative  . Not on file    Hospital Course:   04/22/18 [facility] Counselor Assessment: 32 y.o. male who came to [facility] due to having obsessive thoughts about suicide over the last two weeks. Pt states he has also been having difficulties eating, sleeping, and that he has been hyper-sexual. Pt is unsure if these symptoms are due to his Bipolar Disorder, which he was diagnosed with early this year. Pt shares he is concerned that, if he was triggered, he could act on his suicidal thoughts, though he recognizes that he does not currently have a suicide plan. Pt denies HI and that he is currently NSSIB.  Pt states he has a hx of taking extra medication/insulin than what he is prescribed in an effort to harm himself but not in an effort to kill himself; pt states the last incident of him doing this was approximately one year ago. Pt shares he frequently believes he hears someone say something, such as 'hey' or 'change the [tv] channel,' which occurs typically when he is with someone. Pt stated this started in his 46s and that it is ongoing.  Pt shares he smokes marijuana on a daily basis, drinks EoTH an average of twice per week, and uses cocaine approximately 4-5 times per year. Pt shares his father was an alcoholic and that he was "a heavy meth user." Pt denies pending criminal charges, upcoming court dates, or that he is on probation. He denies having access to weapons.  Patient remained on the [facility] unit for 3 days. The patient stabilized on medication and therapy. Patient was discharged on Wellbutrin XL 300 mg Daily, Neurontin 100 mg BID, Vistaril 50 mg TID PRN, Lithium Carbonate 300 mg QAM and 600 mg QHS, and Trazodone 25 mg QHS PRN. Patient has shown improvement with improved mood, affect, sleep, appetite, and interaction. Patient has attended group and participated. Patient has been seen in the day room interacting with peers and staff appropriately. Patient denies any SI/HI/AVH and contracts for safety. Patient agrees to follow up at [facility]. Patient is provided with prescriptions for their medications upon discharge.    Physical Findings: AIMS: Facial and Oral Movements Muscles of Facial Expression: None, normal Lips and Perioral Area: None, normal Jaw: None, normal Tongue: None, normal,Extremity Movements Upper (arms, wrists, hands, fingers): None, normal Lower (legs, knees, ankles, toes): None, normal, Trunk Movements Neck, shoulders, hips: None, normal, Overall Severity Severity of abnormal movements (highest score from questions above): None, normal Incapacitation due to  abnormal movements: None, normal Patient's awareness of abnormal movements (rate only patient's report): No Awareness, Dental Status Current problems with teeth and/or dentures?: No Does patient usually wear dentures?: No  CIWA:    COWS:     Musculoskeletal: Strength & Muscle Tone: within normal limits Gait & Station: normal Patient leans: N/A  Psychiatric Specialty Exam: Physical Exam  Nursing note and vitals reviewed. Constitutional: He is oriented to person, place, and time. He appears well-developed and well-nourished.  Cardiovascular: Normal rate.  Respiratory: Effort normal.  Musculoskeletal: Normal range of motion.  Neurological: He is alert and oriented to person, place, and time.  Skin: Skin is warm.    Review of Systems  Constitutional: Negative.   HENT: Negative.   Eyes: Negative.   Respiratory: Negative.   Cardiovascular: Negative.   Gastrointestinal: Negative.   Genitourinary: Negative.   Musculoskeletal: Negative.   Skin: Negative.   Neurological: Negative.   Endo/Heme/Allergies: Negative.  Psychiatric/Behavioral: Negative.     Blood pressure 128/85, pulse 81, temperature 98.2 F (36.8 C), temperature source Oral, resp. rate (!) 30, height  (1.803 m), weight 82.1 kg (181 lb), SpO2 100 %.Body mass index is 25.24 kg/m.  General Appearance: Casual  Eye Contact:  Good  Speech:  Clear and Coherent and Normal Rate  Volume:  Normal  Mood:  Euthymic  Affect:  Congruent  Thought Process:  Goal Directed and Descriptions of Associations: Intact  Orientation:  Full (Time, Place, and Person)  Thought Content:  WDL  Suicidal Thoughts:  No  Homicidal Thoughts:  No  Memory:  Immediate;   Good Recent;   Good Remote;   Good  Judgement:  Fair  Insight:  Fair  Psychomotor Activity:  Normal  Concentration:  Concentration: Good and Attention Span: Good  Recall:  Good  Fund of Knowledge:  Good  Language:  Good  Akathisia:  No  Handed:  Right  AIMS (if  indicated):     Assets:  Communication Skills Desire for Improvement Financial Resources/Insurance Housing Physical Health Social Support Transportation  ADL's:  Intact  Cognition:  WNL  Sleep:  Number of Hours: 6        Has this patient used any form of tobacco in the last 30 days? (Cigarettes, Smokeless Tobacco, Cigars, and/or Pipes) Yes, No  Blood Alcohol level:  Lab Results  Component Value Date   ETH <10 04/23/2018   ETH <5 07/11/2017    Metabolic Disorder Labs:  Lab Results  Component Value Date   HGBA1C 6.6 (H) 04/23/2018   MPG 142.72 04/23/2018   MPG 174 07/11/2017   Lab Results  Component Value Date   PROLACTIN 19.3 (H) 07/11/2017   Lab Results  Component Value Date   CHOL 191 04/23/2018   TRIG 63 04/23/2018   HDL 72 04/23/2018   CHOLHDL 2.7 04/23/2018   VLDL 13 04/23/2018   LDLCALC 106 (H) 04/23/2018   LDLCALC 61 10/17/2015    See Psychiatric Specialty Exam and Suicide Risk Assessment completed by Attending Physician prior to discharge.  Discharge destination:  Home  Is patient on multiple antipsychotic therapies at discharge:  No   Has Patient had three or more failed trials of antipsychotic monotherapy by history:  No  Recommended Plan for Multiple Antipsychotic Therapies: NA   Allergies as of 04/25/2018   No Known Allergies     Medication List    STOP taking these medications   FLUoxetine 40 MG capsule Commonly known as:  PROZAC   lithium carbonate 300 MG CR tablet Commonly known as:  LITHOBID Replaced by:  lithium carbonate 300 MG capsule   Melatonin 3 MG Tabs     TAKE these medications     Indication  albuterol 108 (90 Base) MCG/ACT inhaler Commonly known as:  PROVENTIL HFA;VENTOLIN HFA Inhale 1-2 puffs into the lungs every 6 (six) hours as needed for wheezing or shortness of breath.  Indication:  Asthma   buPROPion 300 MG 24 hr tablet Commonly known as:  WELLBUTRIN XL Take 1 tablet (300 mg total) by mouth daily. For mood  control What changed:  additional instructions  Indication:  mood stability   FIASP FLEXTOUCH 100 UNIT/ML Sopn Generic drug:  Insulin Aspart (w/Niacinamide) Inject into the skin. Insulin pump  27.5 basal 1:7 carb ratio  Indication:  Insulin-Dependent Diabetes   gabapentin 100 MG capsule Commonly known as:  NEURONTIN Take 1 capsule (100 mg total) by mouth 3 (three) times daily. For anxiety  Indication:  anxiety   hydrOXYzine 50 MG tablet Commonly known as:  ATARAX/VISTARIL Take 1 tablet (50 mg total) by mouth 3 (three) times daily as needed for anxiety (sleep). What changed:    medication strength  how much to take  Indication:  Feeling Anxious   lithium 600 MG capsule Take 1 capsule (600 mg total) by mouth at bedtime. For mood control  Indication:  mood stability   lithium carbonate 300 MG capsule Take 1 capsule (300 mg total) by mouth every morning. For mood control Start taking on:  04/26/2018 Replaces:  lithium carbonate 300 MG CR tablet  Indication:  mood stability   traZODone 50 MG tablet Commonly known as:  DESYREL Take 0.5 tablets (25 mg total) by mouth at bedtime as needed for sleep.  Indication:  Trouble Sleeping      Follow-up Information    [facility] Go on 04/28/2018.   Why:  Please attend your follow up appt with Eilene Ghazi on Monday, 04/28/18, at 5:15pm. Contact information: [address] [place] [address] [phone]        [facility]. Go on 05/02/2018.   Why:  Please attend your therapy appt with Lubertha South on Friday, 05/02/18, 11:00. Contact information: [address],  [place], [place] [address] P: [phone] F: [phone]          Follow-up recommendations:  Continue activity as tolerated. Continue diet as recommended by your PCP. Ensure to keep all appointments with outpatient providers.  Comments:  Patient is instructed prior to discharge to: Take all medications as prescribed by his/her  mental healthcare provider. Report any adverse effects and or reactions from the medicines to his/her outpatient provider promptly. Patient has been instructed & cautioned: To not engage in alcohol and or illegal drug use while on prescription medicines. In the event of worsening symptoms, patient is instructed to call the crisis hotline, 911 and or go to the nearest ED for appropriate evaluation and treatment of symptoms. To follow-up with his/her primary care provider for your other medical issues, concerns and or health care needs.    Signed: Gerlene Burdock Money, FNP 04/25/2018, 10:08 AM   Patient seen, Suicide Assessment Completed.  Disposition Plan Reviewed

## 2018-04-25 NOTE — Progress Notes (Signed)
D: Patient denies SI, HI or AVH this evening. Patient presents as anxious and animated but pleasant and cooperative.  Pt. States he had the best day today that he has had in 6 months.  Pt. Is visualized in the dayroom interacting with staff and others on the unit.  Pt. Had a male visit him tonight and appeared to be enjoying it.  Pt. Denies any physical complaints.  Pt.  Attended group, was attentive and participated.  A: Patient given emotional support from RN. Patient encouraged to come to staff with concerns and/or questions. Patient's medication routine continued. Patient's orders and plan of care reviewed.   R: Patient remains appropriate and cooperative. Will continue to monitor patient q15 minutes for safety.

## 2018-04-25 NOTE — Progress Notes (Signed)
  [facility] Adult Case Management Discharge Plan :  Will you be returning to the same living situation after discharge:  Yes,  with girlfriend At discharge, do you have transportation home?: Yes,  own vehicle Do you have the ability to pay for your medications: Yes,  [place]  Release of information consent forms completed and in the chart;  Patient's signature needed at discharge.  Patient to Follow up at: Follow-up Information    [facility] Go on 04/28/2018.   Why:  Please attend your follow up appt with Eilene Ghazi on Monday, 04/28/18, at 5:15pm. Contact information: [address] [place] [address] [phone]        [facility] Go on 05/02/2018.   Why:  Please attend your therapy appt with Lubertha South on Friday, 05/02/18, 11:00. Contact information: [address],  [place], [place] [address] P: [phone] F: [phone]          Next level of care provider has access to [facility] Link:no  Safety Planning and Suicide Prevention discussed: Yes,  girlfriend     Has patient been referred to the Quitline?: Yes, faxed on 04/25/18  Patient has been referred for addiction treatment: Yes  Lorri Frederick, LCSW 04/25/2018, 10:03 AM

## 2018-04-25 NOTE — Progress Notes (Signed)
Patient discharged to lobby. Patient was stable and appreciative at that time. All papers and prescriptions were given and valuables returned. Verbal understanding expressed. Denies SI/HI and A/VH. Pt given opportunity to express concerns and ask questions.

## 2018-04-25 NOTE — Progress Notes (Signed)
Recreation Therapy Notes  Date: 5.3.19 Time: 0930 Location: 300 [address] Dayroom  Group Topic: Stress Management  Goal Area(s) Addresses:  Patient will verbalize importance of using healthy stress management.  Patient will identify positive emotions associated with healthy stress management.   Intervention: Stress Management  Activity :  Meditation.  LRT played a meditation on the importance of humanity and how people interact with one another.  Patients were to listen and follow along as the meditation played.  Education:  Stress Management, Discharge Planning.   Education Outcome: Acknowledges edcuation/In group clarification offered/Needs additional education  Clinical Observations/Feedback: Patient did not attend group.     Caroll Rancher, LRT/CTRS         Caroll Rancher A 04/25/2018 11:33 AM

## 2018-09-25 ENCOUNTER — Emergency Department (HOSPITAL_COMMUNITY): Payer: Self-pay

## 2018-09-25 ENCOUNTER — Encounter (HOSPITAL_COMMUNITY): Payer: Self-pay | Admitting: *Deleted

## 2018-09-25 ENCOUNTER — Emergency Department (HOSPITAL_COMMUNITY)
Admission: EM | Admit: 2018-09-25 | Discharge: 2018-09-25 | Disposition: A | Discharge status: Home / Self Care | Payer: Self-pay | Attending: Emergency Medicine | Admitting: Emergency Medicine

## 2018-09-25 DIAGNOSIS — R0789 Other chest pain: Secondary | ICD-10-CM | POA: Insufficient documentation

## 2018-09-25 DIAGNOSIS — E109 Type 1 diabetes mellitus without complications: Secondary | ICD-10-CM | POA: Insufficient documentation

## 2018-09-25 DIAGNOSIS — Z79899 Other long term (current) drug therapy: Secondary | ICD-10-CM | POA: Insufficient documentation

## 2018-09-25 DIAGNOSIS — R0781 Pleurodynia: Secondary | ICD-10-CM

## 2018-09-25 MED ORDER — OXYCODONE-ACETAMINOPHEN 5-325 MG PO TABS
2.0000 | ORAL_TABLET | Freq: Once | ORAL | Status: AC
  Administered 2018-09-25: 2 via ORAL
  Filled 2018-09-25: qty 2

## 2018-09-25 MED ORDER — ONDANSETRON 4 MG PO TBDP
4.0000 mg | ORAL_TABLET | Freq: Once | ORAL | Status: AC
  Administered 2018-09-25: 4 mg via ORAL
  Filled 2018-09-25: qty 1

## 2018-09-25 NOTE — ED Provider Notes (Signed)
[facility]-EMERGENCY DEPT Provider Note  CSN: [id number] Arrival date & time: 09/25/18  1222    History   Chief Complaint Chief Complaint  Patient presents with  . Fall  . Rib Injury    HPI Micheal Lopez is a 32 y.o. male with a medical history of Type 1 DM who presented to the ED 7 hours after a fall. Patient states that he was attempting to break up a dog fight when he fell on a baby gate in the process. Currently complains of right sided rib pain and is concerned that he broke a rib. Endorses worse pain with deep respirations. Denies chest pain, SOB, dyspnea, abdominal pain or other injuries. He has tried no intervention prior to arrival.  Injury  This is a new problem. The current episode started 6 to 12 hours ago. The problem occurs constantly. The problem has not changed since onset.Pertinent negatives include no chest pain, no abdominal pain and no shortness of breath. Associated symptoms comments: Right rib pain. Exacerbated by: deep breaths. The symptoms are relieved by rest and position. He has tried nothing for the symptoms.    Past Medical History:  Diagnosis Date  . Depression    per pt.  . Diabetes mellitus without complication (HCC)    Type I    Patient Active Problem List   Diagnosis Date Noted  . Bipolar disorder (HCC) 04/22/2018  . 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 Alcohol abuse   . Alcohol abuse with alcohol-induced mood disorder (HCC) 08/23/2016  . Major depressive disorder, recurrent severe without psychotic features (HCC) 07/31/2016  . Alcohol intoxication (HCC)   . Substance induced mood disorder (HCC) 09/22/2015  . Alcohol use disorder, severe, dependence (HCC) 09/21/2015  . Suicidal ideation     History reviewed. No pertinent surgical history.      Home Medications    Prior to Admission medications   Medication Sig Start Date End Date Taking? Authorizing Provider    albuterol (PROVENTIL HFA;VENTOLIN HFA) 108 (90 Base) MCG/ACT inhaler Inhale 1-2 puffs into the lungs every 6 (six) hours as needed for wheezing or shortness of breath. 04/25/18   Money, Gerlene Burdock, FNP  buPROPion (WELLBUTRIN XL) 300 MG 24 hr tablet Take 1 tablet (300 mg total) by mouth daily. For mood control 04/25/18   Money, Gerlene Burdock, FNP  gabapentin (NEURONTIN) 100 MG capsule Take 1 capsule (100 mg total) by mouth 3 (three) times daily. For anxiety 04/25/18   Money, Gerlene Burdock, FNP  hydrOXYzine (ATARAX/VISTARIL) 50 MG tablet Take 1 tablet (50 mg total) by mouth 3 (three) times daily as needed for anxiety (sleep). 04/25/18   Money, Gerlene Burdock, FNP  Insulin Aspart, w/Niacinamide, (FIASP FLEXTOUCH) 100 UNIT/ML SOPN Inject into the skin. Insulin pump  27.5 basal 1:7 carb ratio    [provider]  lithium carbonate 300 MG capsule Take 1 capsule (300 mg total) by mouth every morning. For mood control 04/26/18   Money, Feliz Beam B, FNP  lithium carbonate 600 MG capsule Take 1 capsule (600 mg total) by mouth at bedtime. For mood control 04/25/18   Money, Gerlene Burdock, FNP  traZODone (DESYREL) 50 MG tablet Take 0.5 tablets (25 mg total) by mouth at bedtime as needed for sleep. 04/25/18   Money, Gerlene Burdock, FNP    Family History Family History  Problem Relation Age of Onset  . Cancer Maternal Grandmother        BREAST  Social History Social History   Tobacco Use  . Smoking status: Current Every Day Smoker    Packs/day: 1.00    Types: E-cigarettes, Cigarettes  . Smokeless tobacco: Never Used  Substance Use Topics  . Alcohol use: Yes    Comment: binge drinking  . Drug use: Yes    Frequency: 7.0 times per week    Types: Marijuana     Allergies   Patient has no known allergies.   Review of Systems Review of Systems  Constitutional: Negative.   Respiratory: Negative for cough, chest tightness and shortness of breath.   Cardiovascular: Negative for chest pain and palpitations.  Gastrointestinal:  Negative for abdominal pain.  Musculoskeletal: Negative for back pain.       Right rib pain  Skin:       Bruising to right side  Neurological: Negative.    Physical Exam Updated Vital Signs BP 131/88 (BP Location: Right Arm)   Pulse 92   Temp 98.1 F (36.7 C) (Oral)   Resp 18   SpO2 100%   Physical Exam  Constitutional: Vital signs are normal. He appears well-developed and well-nourished. He is cooperative.  Cardiovascular: Normal rate, regular rhythm and normal heart sounds.  No murmur heard. Pulmonary/Chest: Effort normal and breath sounds normal. No tachypnea. No respiratory distress. He has no decreased breath sounds. He exhibits no tenderness, no crepitus, no deformity and no swelling. Mass: Tenderness at approx. rib 11-12 and tenderness along right oblique muscles.  Abdominal: Soft. Normal appearance and bowel sounds are normal. There is no tenderness.    Musculoskeletal:  Full ROM of upper and lower extremities bilaterally with 5/5 strength.   Neurological: He is alert.  Skin: Skin is warm. Capillary refill takes less than 2 seconds. Bruising noted.  Purple-ish bruising to the right oblique area below ribs 11-12. No abdominal, flank or back injuries.  Nursing note and vitals reviewed.  ED Treatments / Results  Labs (all labs ordered are listed, but only abnormal results are displayed) Labs Reviewed - No data to display  EKG None  Radiology Dg Chest 2 View  Result Date: 09/25/2018 CLINICAL DATA:  Patient fell striking a metal baby gate at home last night. Persistent severe right lateral chest wall pain. Some shortness of breath. EXAM: CHEST - 2 VIEW COMPARISON:  Chest x-ray of September 18, 2017 FINDINGS: The lungs are adequately inflated. There is no focal infiltrate. There is no pleural effusion. The heart and pulmonary vascularity are normal. The mediastinum is normal in width. The bony thorax exhibits no acute abnormality. There is gentle S shaped thoracolumbar  scoliosis which is stable. IMPRESSION: There is no active cardiopulmonary disease. Electronically Signed   By: David  [place] M.D.   On: 09/25/2018 13:42    Procedures Procedures (including critical care time)  Medications Ordered in ED Medications  oxyCODONE-acetaminophen (PERCOCET/ROXICET) 5-325 MG per tablet 2 tablet (has no administration in time range)  ondansetron (ZOFRAN-ODT) disintegrating tablet 4 mg (has no administration in time range)     Initial Impression / Assessment and Plan / ED Course  Triage vital signs and the nursing notes have been reviewed.  Pertinent labs & imaging results that were available during care of the patient were reviewed and considered in medical decision making (see chart for details).  Patient presents following a fall where he landed on his right side of his trunk. Patient is not in respiratory distress and has a normal pulmonary exam. No decreased breath sounds or other extraneous sounds  to suggest damage to lung pleura or emergent issues such as pneumothorax. He denies any other injuries sustained. He has bruising to the right rib/trunk area where he endorses pain. No abdominal tenderness or other abnormal findings to suggest internal injuries. Remaining physical exam is also normal. Will order CXR to evaluate for pleural or other chest wall injuries.   Clinical Course as of Sep 25 1404  Thu Sep 25, 2018  1402 CXR normal. No pleural injury or rib fractures seen. Normal expansion of lung fields and normal vasculature.   [GM]    Clinical Course User Index [GM] Abdoulaye Drum, Sharyon Medicus, PA-C    Final Clinical Impressions(s) / ED Diagnoses  1. Right Trunk Pain. Education provided on OTC and supportive treatment for pain relief. Education provided on red flag pulm, cardio and GI s/s that would warrant sooner follow-up.  Dispo: Home. After thorough clinical evaluation, this patient is determined to be medically stable and can be safely discharged with the  previously mentioned treatment and/or outpatient follow-up/referral(s). At this time, there are no other apparent medical conditions that require further screening, evaluation or treatment.   Final diagnoses:  Rib pain on right side    ED Discharge Orders    None        Reva Bores 09/25/18 1405    Mesner, Barbara Cower, MD 09/26/18 1627

## 2018-09-25 NOTE — ED Notes (Signed)
Bed: WTR9 Expected date:  Expected time:  Means of arrival:  Comments: 

## 2018-09-25 NOTE — ED Triage Notes (Signed)
Pt complains of pain in right rib cage since falling last night. Pt states he fell onto a metal baby gate, hitting his right rib cage. Pt also hit the area where his insulin pump is inserted and is not sure if it is delivering insulin.

## 2018-09-25 NOTE — Discharge Instructions (Addendum)
Your chest x-ray did not show any rib fractures or injury to the lung tissue. It is possible that you bruised your ribs.  You may use Tylenol and/or Ibuprofen for pain relief. Cold compresses to this area may provide additional relief as well. As we discussed, you will probably be more sore tomorrow and for the next couple of weeks. Know your own limits with physical activity and contact. Follow-up with a medical provider sooner if you begin to difficulties with breathing, abdominal pain, bloody stools or have acute worsening of this pain.  I'm sorry that this happened to you today. Take it easy on breaking up future dog fights. I wish you a quick recovery and healing!

## 2018-09-25 NOTE — ED Notes (Signed)
Discussed discharge instructions with pt. No questions, comments, or concerns at this time.

## 2019-03-12 IMAGING — CR DG CHEST 2V
2 series · 2 of 2 positions shown · non-contrast
Comparison: Chest x-ray of September 18, 2017

CLINICAL DATA: Patient fell striking a metal baby gate at home last
night. Persistent severe right lateral chest wall pain. Some
shortness of breath.

EXAM:
CHEST - 2 VIEW

[w chest pa]
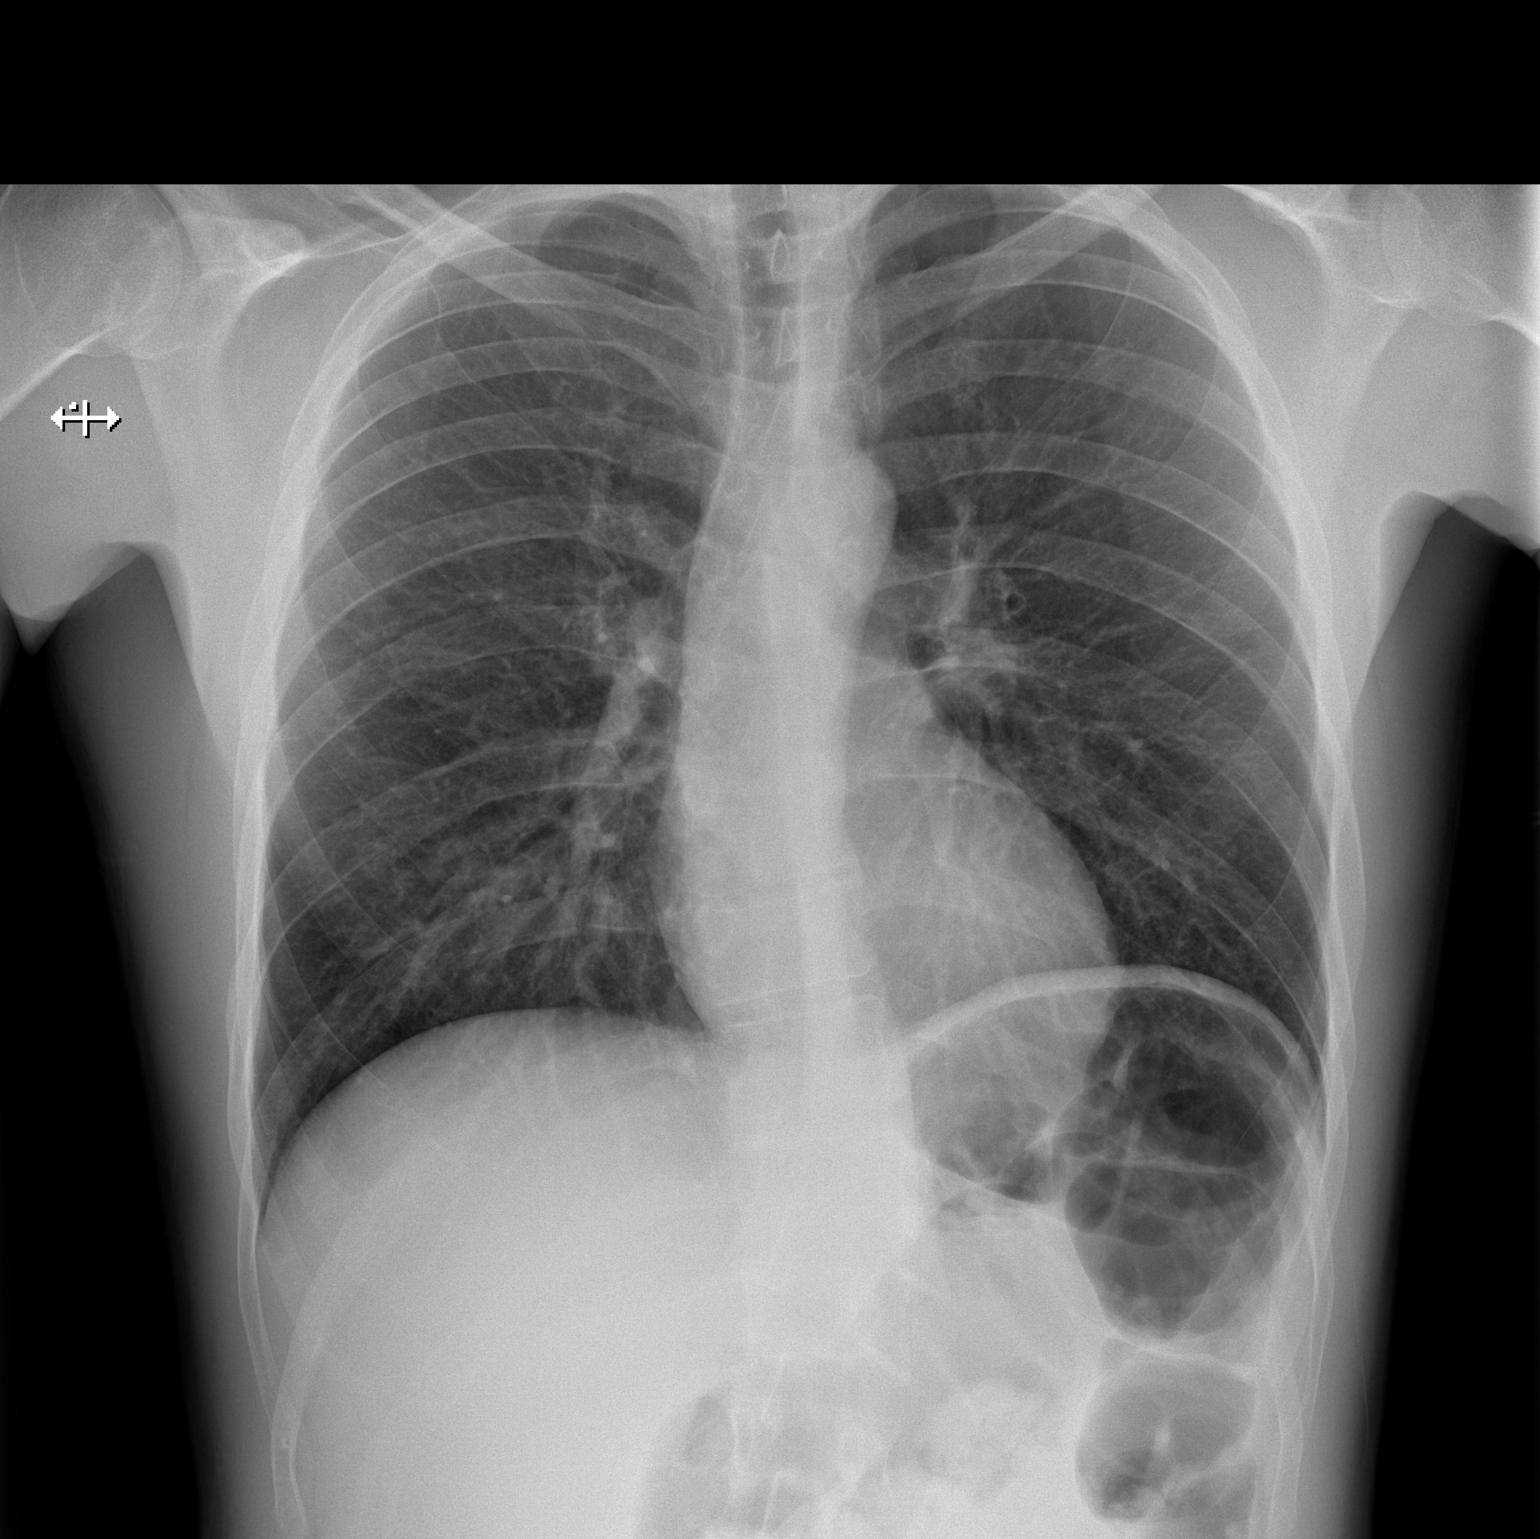

[w chest lat]
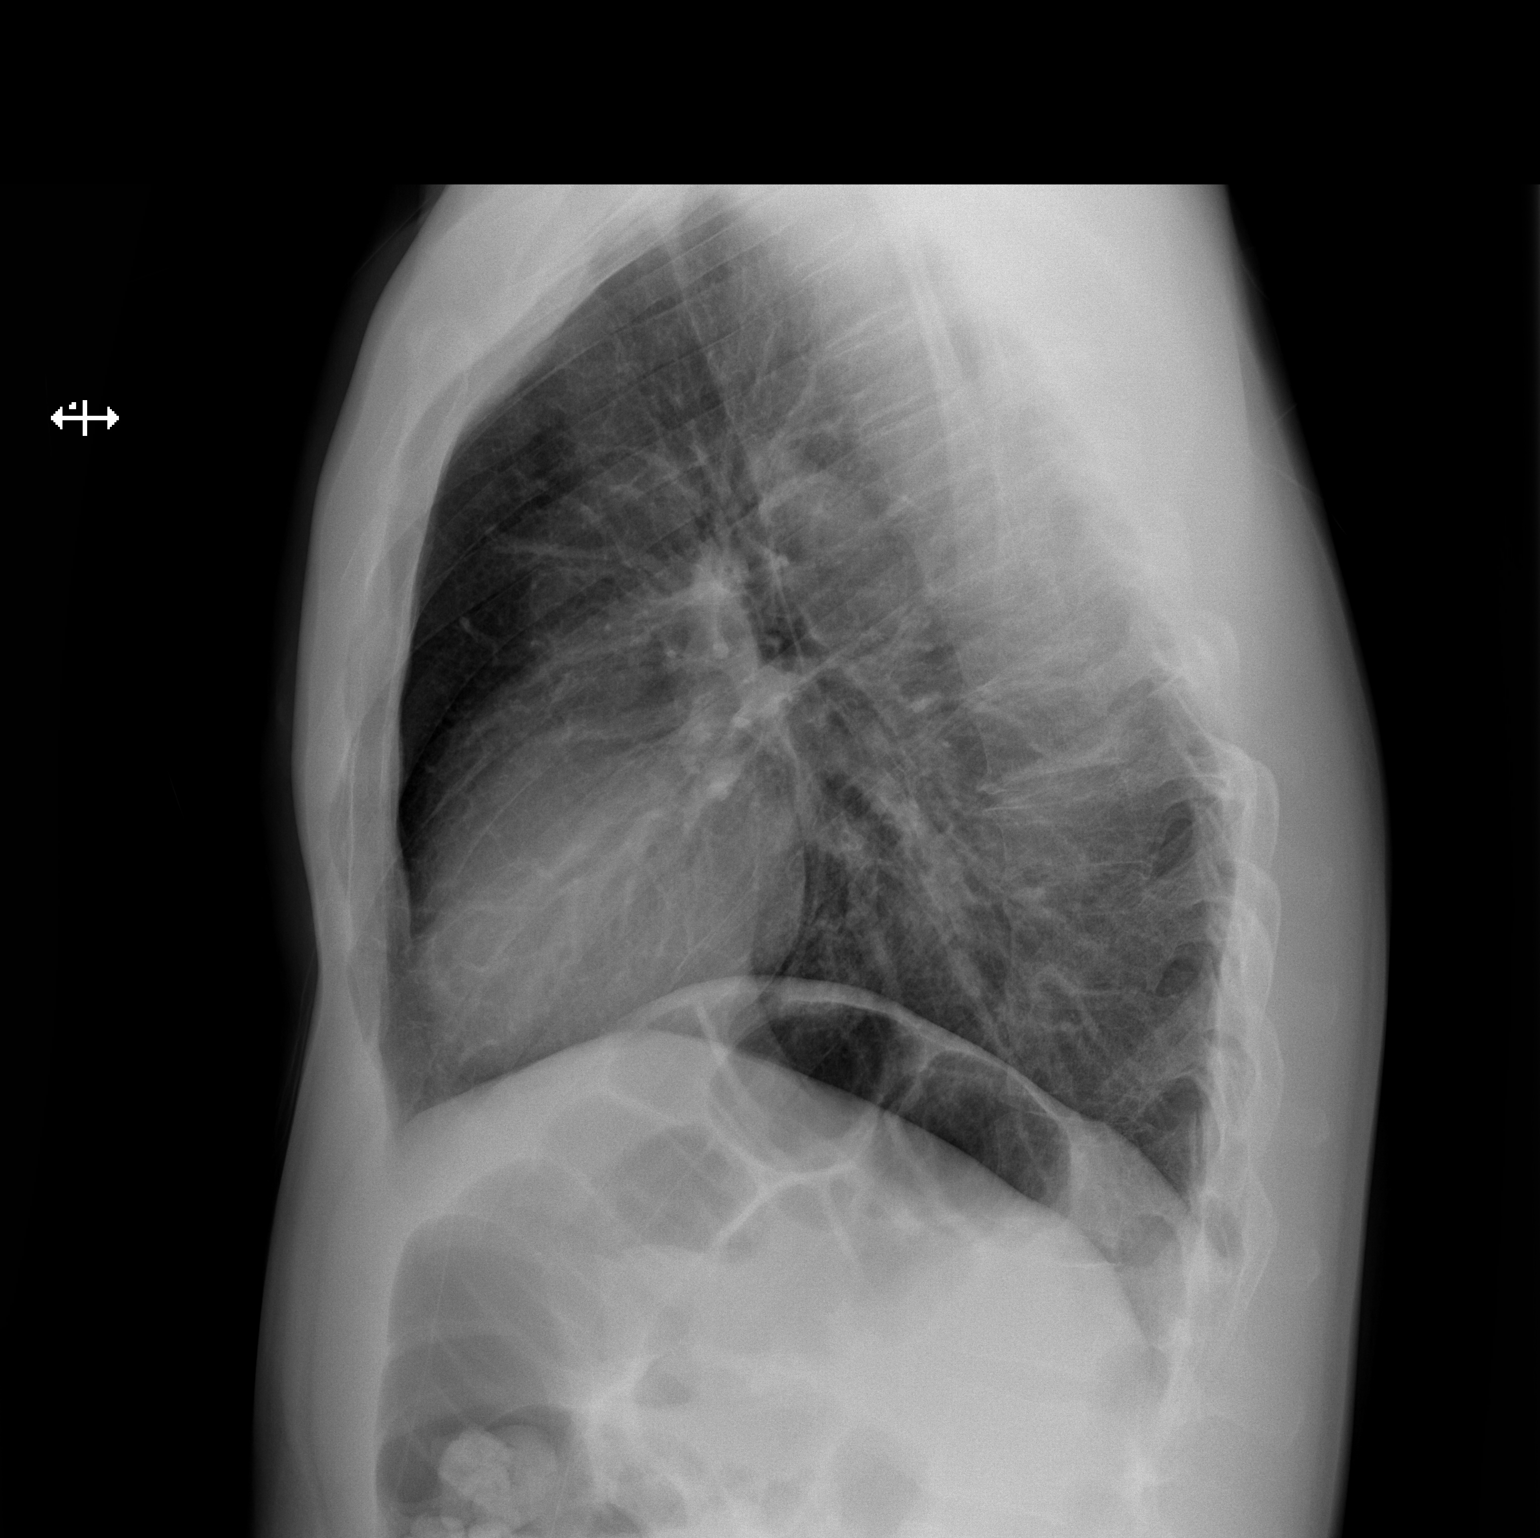

[2 of 2 positions shown; findings below may reference images not displayed]

FINDINGS: The lungs are adequately inflated. There is no focal infiltrate.
There is no pleural effusion. The heart and pulmonary vascularity
are normal. The mediastinum is normal in width. The bony thorax
exhibits no acute abnormality. There is gentle S shaped
thoracolumbar scoliosis which is stable.
IMPRESSION: There is no active cardiopulmonary disease.
